# Patient Record
Sex: Female | Born: 1975 | Race: Black or African American | Hispanic: No | Marital: Married | Smoking: Never smoker
Health system: Southern US, Community
[De-identification: ages and names within clinical notes are randomized; demographics above are authoritative.]

## PROBLEM LIST (undated history)

## (undated) ENCOUNTER — Inpatient Hospital Stay (HOSPITAL_COMMUNITY): Payer: Self-pay

## (undated) DIAGNOSIS — R011 Cardiac murmur, unspecified: Secondary | ICD-10-CM

## (undated) DIAGNOSIS — O09299 Supervision of pregnancy with other poor reproductive or obstetric history, unspecified trimester: Secondary | ICD-10-CM

## (undated) DIAGNOSIS — T1491XA Suicide attempt, initial encounter: Secondary | ICD-10-CM

## (undated) DIAGNOSIS — O24419 Gestational diabetes mellitus in pregnancy, unspecified control: Secondary | ICD-10-CM

## (undated) DIAGNOSIS — N883 Incompetence of cervix uteri: Secondary | ICD-10-CM

## (undated) DIAGNOSIS — I493 Ventricular premature depolarization: Secondary | ICD-10-CM

## (undated) DIAGNOSIS — I10 Essential (primary) hypertension: Secondary | ICD-10-CM

## (undated) HISTORY — PX: LEEP: SHX91

## (undated) HISTORY — PX: CERVICAL CERCLAGE: SHX1329

## (undated) HISTORY — PX: TUBAL LIGATION: SHX77

---

## 1995-05-06 HISTORY — PX: DILATION AND CURETTAGE OF UTERUS: SHX78

## 2004-05-05 HISTORY — PX: WISDOM TOOTH EXTRACTION: SHX21

## 2009-07-24 ENCOUNTER — Emergency Department (HOSPITAL_COMMUNITY)
Admission: EM | Admit: 2009-07-24 | Discharge: 2009-07-24 | Payer: Self-pay | Source: Home / Self Care | Admitting: Emergency Medicine

## 2010-01-02 ENCOUNTER — Ambulatory Visit (HOSPITAL_COMMUNITY)
Admission: RE | Admit: 2010-01-02 | Discharge: 2010-01-02 | Payer: Self-pay | Source: Home / Self Care | Admitting: Obstetrics and Gynecology

## 2010-02-20 ENCOUNTER — Observation Stay (HOSPITAL_COMMUNITY)
Admission: AD | Admit: 2010-02-20 | Discharge: 2010-02-21 | Payer: Self-pay | Source: Home / Self Care | Admitting: Obstetrics and Gynecology

## 2010-02-21 ENCOUNTER — Encounter: Payer: Self-pay | Admitting: Obstetrics and Gynecology

## 2010-03-08 ENCOUNTER — Inpatient Hospital Stay (HOSPITAL_COMMUNITY)
Admission: AD | Admit: 2010-03-08 | Discharge: 2010-03-12 | Payer: Self-pay | Source: Home / Self Care | Admitting: Obstetrics and Gynecology

## 2010-03-11 ENCOUNTER — Encounter: Payer: Self-pay | Admitting: Obstetrics and Gynecology

## 2010-04-04 DEATH — deceased

## 2010-07-16 LAB — CBC
HCT: 33.6 % — ABNORMAL LOW (ref 36.0–46.0)
HCT: 36.9 % (ref 36.0–46.0)
Hemoglobin: 11.2 g/dL — ABNORMAL LOW (ref 12.0–15.0)
MCH: 28.8 pg (ref 26.0–34.0)
MCHC: 33.3 g/dL (ref 30.0–36.0)
MCV: 86.7 fL (ref 78.0–100.0)
RBC: 4.26 MIL/uL (ref 3.87–5.11)
WBC: 9.5 10*3/uL (ref 4.0–10.5)

## 2010-07-16 LAB — STREP B DNA PROBE: Strep Group B Ag: NEGATIVE

## 2010-07-19 LAB — CBC
MCH: 29.3 pg (ref 26.0–34.0)
MCV: 87.7 fL (ref 78.0–100.0)
Platelets: 246 10*3/uL (ref 150–400)
RDW: 13.8 % (ref 11.5–15.5)

## 2010-07-29 LAB — URINALYSIS, ROUTINE W REFLEX MICROSCOPIC
Bilirubin Urine: NEGATIVE
Ketones, ur: NEGATIVE mg/dL
Nitrite: NEGATIVE
Urobilinogen, UA: 0.2 mg/dL (ref 0.0–1.0)

## 2010-07-29 LAB — GC/CHLAMYDIA PROBE AMP, GENITAL
Chlamydia, DNA Probe: NEGATIVE
GC Probe Amp, Genital: NEGATIVE

## 2010-07-29 LAB — URINE MICROSCOPIC-ADD ON

## 2010-07-29 LAB — WET PREP, GENITAL
Clue Cells Wet Prep HPF POC: NONE SEEN
Trich, Wet Prep: NONE SEEN

## 2010-08-14 ENCOUNTER — Telehealth: Payer: Self-pay | Admitting: Cardiology

## 2010-12-04 NOTE — Telephone Encounter (Signed)
No note is required for this encoutner

## 2011-03-26 ENCOUNTER — Encounter: Payer: Self-pay | Admitting: Emergency Medicine

## 2011-03-26 ENCOUNTER — Emergency Department (HOSPITAL_COMMUNITY)
Admission: EM | Admit: 2011-03-26 | Discharge: 2011-03-26 | Disposition: A | Discharge status: Home / Self Care | Payer: Self-pay | Attending: Emergency Medicine | Admitting: Emergency Medicine

## 2011-03-26 DIAGNOSIS — R059 Cough, unspecified: Secondary | ICD-10-CM | POA: Insufficient documentation

## 2011-03-26 DIAGNOSIS — Z79899 Other long term (current) drug therapy: Secondary | ICD-10-CM | POA: Insufficient documentation

## 2011-03-26 DIAGNOSIS — R05 Cough: Secondary | ICD-10-CM | POA: Insufficient documentation

## 2011-03-26 DIAGNOSIS — J029 Acute pharyngitis, unspecified: Secondary | ICD-10-CM

## 2011-03-26 MED ORDER — HYDROCODONE-ACETAMINOPHEN 7.5-325 MG/15ML PO SOLN: 15.0000 mL | Freq: Three times a day (TID) | ORAL | Status: AC | PRN

## 2011-03-26 NOTE — ED Provider Notes (Signed)
History     CSN: [id number] Arrival date & time: 03/26/2011  4:05 AM   First MD Initiated Contact with Patient 03/26/11 0411      Chief Complaint  Patient presents with  . Sore Throat    sorethroat since yesterday    (Consider location/radiation/quality/duration/timing/severity/associated sxs/prior treatment) HPI Comments: Sx gradual onset Sx are constant Sx are moderate Sx are not associated with n/v or f/c.  Has cough which is non ptorductive  Has multiple sick contacts  Patient is a 35 y.o. female presenting with pharyngitis. The history is provided by the patient.  Sore Throat This is a new problem. The current episode started 2 days ago. The problem occurs constantly. The problem has been gradually worsening. Pertinent negatives include no chest pain, no abdominal pain, no headaches and no shortness of breath. The symptoms are aggravated by swallowing. The symptoms are relieved by nothing. She has tried nothing for the symptoms.    History reviewed. No pertinent past medical history.  History reviewed. No pertinent past surgical history.  History reviewed. No pertinent family history.  History  Substance Use Topics  . Smoking status: Never Smoker   . Smokeless tobacco: Not on file  . Alcohol Use: No    OB History    Grav Para Term Preterm Abortions TAB SAB Ect Mult Living                  Review of Systems  Respiratory: Negative for shortness of breath.   Cardiovascular: Negative for chest pain.  Gastrointestinal: Negative for abdominal pain.  Neurological: Negative for headaches.  All other systems reviewed and are negative.    Allergies  Review of patient's allergies indicates no known allergies.  Home Medications   Current Outpatient Rx  Name Route Sig Dispense Refill  . HYDROCHLOROTHIAZIDE 12.5 MG PO TABS Oral Take 12.5 mg by mouth daily.      Marland Kitchen HYDROCODONE-ACETAMINOPHEN 7.5-325 MG/15ML PO SOLN Oral Take 15 mLs by mouth every 8 (eight) hours as  needed for pain. 120 mL 0    BP 142/92  Pulse 83  Temp(Src) 98.2 F (36.8 C) (Oral)  Resp 17  SpO2 100%  LMP 02/16/2011  Physical Exam  Nursing note and vitals reviewed. Constitutional: She appears well-developed and well-nourished. No distress.  HENT:  Head: Normocephalic and atraumatic.  Mouth/Throat: No oropharyngeal exudate.       Erythema of the oropharynx without asymmetry, hypertrophy, or exudate.  Voice normal.  Bil TM's normal  Eyes: Conjunctivae and EOM are normal. Pupils are equal, round, and reactive to light. Right eye exhibits no discharge. Left eye exhibits no discharge. No scleral icterus.  Neck: Normal range of motion. Neck supple. No JVD present. No thyromegaly present.  Cardiovascular: Normal rate, regular rhythm, normal heart sounds and intact distal pulses.  Exam reveals no gallop and no friction rub.   No murmur heard. Pulmonary/Chest: Effort normal and breath sounds normal. No respiratory distress. She has no wheezes. She has no rales.  Abdominal: Soft. Bowel sounds are normal. She exhibits no distension and no mass. There is no tenderness.       No HSM  Musculoskeletal: Normal range of motion. She exhibits no edema and no tenderness.  Lymphadenopathy:    She has no cervical adenopathy.  Neurological: She is alert. Coordination normal.  Skin: Skin is warm and dry. No rash noted. No erythema.  Psychiatric: She has a normal mood and affect. Her behavior is normal.    ED Course  Procedures (including critical care time)   Labs Reviewed  RAPID STREP SCREEN   No results found.   1. Pharyngitis       MDM  Has normal VS without cervical adenopathy, sat's normal, voice normal, strep test sent.  Well appearing otherwise.  Strep test negative, will send him with a Chloraseptic Spray and hydrocodone suspension.  Vida Roller, MD 03/26/11 [phone]

## 2011-03-26 NOTE — ED Notes (Signed)
Patient c/o sorethroat since yesterday and wanted to check if she is pregnant because she missed her periods by a week

## 2011-04-30 LAB — OB RESULTS CONSOLE HEPATITIS B SURFACE ANTIGEN: Hepatitis B Surface Ag: NEGATIVE

## 2011-04-30 LAB — OB RESULTS CONSOLE ANTIBODY SCREEN: Antibody Screen: NEGATIVE

## 2011-04-30 LAB — OB RESULTS CONSOLE RUBELLA ANTIBODY, IGM: Rubella: IMMUNE

## 2011-05-06 HISTORY — PX: TUBAL LIGATION: SHX77

## 2011-05-14 ENCOUNTER — Other Ambulatory Visit: Payer: Self-pay

## 2011-05-14 ENCOUNTER — Encounter (HOSPITAL_COMMUNITY): Payer: Self-pay | Admitting: Pharmacist

## 2011-05-17 ENCOUNTER — Inpatient Hospital Stay (HOSPITAL_COMMUNITY): Payer: Medicaid Other

## 2011-05-17 ENCOUNTER — Encounter (HOSPITAL_COMMUNITY): Payer: Self-pay

## 2011-05-17 ENCOUNTER — Inpatient Hospital Stay (HOSPITAL_COMMUNITY)
Admission: AD | Admit: 2011-05-17 | Discharge: 2011-05-17 | Disposition: A | Discharge status: Home / Self Care | Payer: Medicaid Other | Source: Ambulatory Visit | Attending: Obstetrics and Gynecology | Admitting: Obstetrics and Gynecology

## 2011-05-17 DIAGNOSIS — O26899 Other specified pregnancy related conditions, unspecified trimester: Secondary | ICD-10-CM

## 2011-05-17 DIAGNOSIS — O3430 Maternal care for cervical incompetence, unspecified trimester: Secondary | ICD-10-CM | POA: Insufficient documentation

## 2011-05-17 DIAGNOSIS — O99891 Other specified diseases and conditions complicating pregnancy: Secondary | ICD-10-CM | POA: Insufficient documentation

## 2011-05-17 DIAGNOSIS — R109 Unspecified abdominal pain: Secondary | ICD-10-CM

## 2011-05-17 DIAGNOSIS — R1032 Left lower quadrant pain: Secondary | ICD-10-CM | POA: Insufficient documentation

## 2011-05-17 HISTORY — DX: Supervision of pregnancy with other poor reproductive or obstetric history, unspecified trimester: O09.299

## 2011-05-17 HISTORY — DX: Incompetence of cervix uteri: N88.3

## 2011-05-17 LAB — URINE CULTURE: Culture  Setup Time: 201301130331

## 2011-05-17 LAB — URINALYSIS, ROUTINE W REFLEX MICROSCOPIC
Glucose, UA: NEGATIVE mg/dL
Protein, ur: NEGATIVE mg/dL
pH: 6 (ref 5.0–8.0)

## 2011-05-17 LAB — URINE MICROSCOPIC-ADD ON

## 2011-05-17 MED ORDER — OXYCODONE-ACETAMINOPHEN 5-325 MG PO TABS
2.0000 | ORAL_TABLET | Freq: Once | ORAL | Status: AC
  Administered 2011-05-17: 2 via ORAL
  Filled 2011-05-17: qty 2

## 2011-05-17 MED ORDER — HYDROCODONE-ACETAMINOPHEN 5-500 MG PO TABS: 1.0000 | ORAL_TABLET | Freq: Four times a day (QID) | ORAL | Status: AC | PRN

## 2011-05-17 NOTE — Progress Notes (Signed)
Onset of left lower abdominal pain sharp in nature x 3 days worse today at work, patient denies dysuria, regular bowel movements, no vaginal bleeding or discharge. Patient is 13 week sgestation.

## 2011-05-17 NOTE — ED Provider Notes (Signed)
History   Pt presents today c/o worsening LLQ pain over the past 4 days. She is currently 12.6wks with a hx of incompetent cervix. She denies vag dc, bleeding, fever, or any other sx at this time.  Chief Complaint  Patient presents with  . Abdominal Pain   HPI  OB History    Grav Para Term Preterm Abortions TAB SAB Ect Mult Living   6 5 2 3  0 0 0 0 0 3      Past Medical History  Diagnosis Date  . Incompetence of cervix   . Prior pregnancy with fetal demise     Past Surgical History  Procedure Date  . Leep     Family History  Problem Relation Age of Onset  . Diabetes Mother   . Diabetes Father   . Heart disease Father   . Hypertension Father     History  Substance Use Topics  . Smoking status: Never Smoker   . Smokeless tobacco: Not on file  . Alcohol Use: No    Allergies: No Known Allergies  Prescriptions prior to admission  Medication Sig Dispense Refill  . Prenatal Vit-Fe Fumarate-FA (MULTIVITAMIN-PRENATAL) 27-0.8 MG TABS Take 1 tablet by mouth daily.        Review of Systems  Constitutional: Negative for fever.  Eyes: Negative for blurred vision and double vision.  Cardiovascular: Negative for chest pain.  Gastrointestinal: Positive for abdominal pain. Negative for nausea, vomiting, diarrhea and constipation.  Genitourinary: Negative for dysuria, urgency, frequency and hematuria.  Neurological: Negative for dizziness and headaches.  Psychiatric/Behavioral: Negative for depression and suicidal ideas.   Physical Exam   Blood pressure 113/75, temperature 98.9 F (37.2 C), resp. rate 16, height 5\' 6"  (1.676 m), weight 191 lb (86.637 kg), last menstrual period 02/16/2011.  Physical Exam  Nursing note and vitals reviewed. Constitutional: She is oriented to person, place, and time. She appears well-developed and well-nourished. No distress.  HENT:  Head: Normocephalic and atraumatic.  Eyes: EOM are normal. Pupils are equal, round, and reactive to light.    GI: Soft. She exhibits no distension and no mass. There is no tenderness. There is no rebound and no guarding.  Genitourinary: No bleeding around the vagina. No vaginal discharge found.       Cervix Lg/closed.  Neurological: She is alert and oriented to person, place, and time.  Skin: Skin is warm and dry. She is not diaphoretic.  Psychiatric: She has a normal mood and affect. Her behavior is normal. Judgment and thought content normal.    MAU Course  Procedures  Results for orders placed during the hospital encounter of 05/17/11 (from the past 72 hour(s))  URINALYSIS, ROUTINE W REFLEX MICROSCOPIC     Status: Abnormal   Collection Time   05/17/11  5:12 PM      Component Value Range Comment   Color, Urine YELLOW  YELLOW     APPearance CLEAR  CLEAR     Specific Gravity, Urine 1.015  1.005 - 1.030     pH 6.0  5.0 - 8.0     Glucose, UA NEGATIVE  NEGATIVE (mg/dL)    Hgb urine dipstick MODERATE (*) NEGATIVE     Bilirubin Urine NEGATIVE  NEGATIVE     Ketones, ur NEGATIVE  NEGATIVE (mg/dL)    Protein, ur NEGATIVE  NEGATIVE (mg/dL)    Urobilinogen, UA 0.2  0.0 - 1.0 (mg/dL)    Nitrite NEGATIVE  NEGATIVE     Leukocytes, UA NEGATIVE  NEGATIVE  URINE MICROSCOPIC-ADD ON     Status: Normal   Collection Time   05/17/11  5:12 PM      Component Value Range Comment   Squamous Epithelial / LPF RARE  RARE     RBC / HPF 3-6  <3 (RBC/hpf)    Urine sent for culture.  Discussed pt with Dr. Vincente Poli. Will get renal US.  US Renal  05/17/2011  *RADIOLOGY REPORT*  Clinical Data: Left flank pain.  Hematuria.  [redacted] weeks pregnant.  RENAL/URINARY TRACT ULTRASOUND COMPLETE  Comparison:  None.  Findings:  Right Kidney:  13.0 cm. No hydronephrosis.    The technologist describes an area of inter/lower pole echogenicity.  This is felt to represent a vessel.  Left Kidney:  12.5 cm. No hydronephrosis.  Bladder:  Within normal limits.  Bilateral ureteric jets identified.  IMPRESSION: Normal renal ultrasound.  No  explanation for left flank pain or hematuria.  Please note that ultrasound is of low sensitivity for renal calculi.  Original Report Authenticated By: Consuello Bossier, M.D.   Pt pain much improved following po pain meds.   Assessment and Plan  Pain in preg: discussed with pt at length. Will dc to home with lortab. She will f/u in Dr. Lynnell Dike office on Monday. Urine sent for culture. Discussed diet, activity, risks, and precautions.  Clinton Gallant. Jennifer Haas III, DrHSc, MPAS, PA-C  05/17/2011, 6:22 PM   Jennifer Hoover, PA 05/17/11 1936

## 2011-05-17 NOTE — Progress Notes (Signed)
Henrietta Hoover PA in to discuss u/s results with pt and d/c plan

## 2011-05-17 NOTE — Progress Notes (Signed)
Patient had preterm delivery at 22 weeks with previous pregnancy, scheduled for cerclage on 05/29/11

## 2011-05-17 NOTE — Progress Notes (Signed)
Pt presents to MAU with chief complaint of left sided abdominal pain. Pain started 4 days ago; sharp, intermittent, lower left side of abdomen. Pt is [redacted]w[redacted]d, hx of 22 week loss. No bleeding.

## 2011-05-17 NOTE — Progress Notes (Signed)
Written and verbal d/c instructions given and understanding voiced. 

## 2011-05-18 ENCOUNTER — Inpatient Hospital Stay (HOSPITAL_COMMUNITY)
Admission: AD | Admit: 2011-05-18 | Discharge: 2011-05-19 | DRG: 781 | Disposition: A | Discharge status: Home / Self Care | Payer: Medicaid Other | Source: Ambulatory Visit | Attending: Obstetrics and Gynecology | Admitting: Obstetrics and Gynecology

## 2011-05-18 ENCOUNTER — Encounter (HOSPITAL_COMMUNITY): Payer: Self-pay | Admitting: *Deleted

## 2011-05-18 ENCOUNTER — Inpatient Hospital Stay (HOSPITAL_COMMUNITY): Payer: Medicaid Other

## 2011-05-18 DIAGNOSIS — D259 Leiomyoma of uterus, unspecified: Secondary | ICD-10-CM | POA: Diagnosis present

## 2011-05-18 DIAGNOSIS — R1032 Left lower quadrant pain: Secondary | ICD-10-CM | POA: Diagnosis present

## 2011-05-18 DIAGNOSIS — O99891 Other specified diseases and conditions complicating pregnancy: Principal | ICD-10-CM | POA: Diagnosis present

## 2011-05-18 DIAGNOSIS — O3410 Maternal care for benign tumor of corpus uteri, unspecified trimester: Secondary | ICD-10-CM | POA: Diagnosis present

## 2011-05-18 DIAGNOSIS — R319 Hematuria, unspecified: Secondary | ICD-10-CM | POA: Diagnosis present

## 2011-05-18 LAB — URINALYSIS, ROUTINE W REFLEX MICROSCOPIC
Bilirubin Urine: NEGATIVE
Glucose, UA: NEGATIVE mg/dL
Ketones, ur: NEGATIVE mg/dL
pH: 6 (ref 5.0–8.0)

## 2011-05-18 LAB — CBC
HCT: 37 % (ref 36.0–46.0)
Hemoglobin: 12.6 g/dL (ref 12.0–15.0)
MCH: 28.9 pg (ref 26.0–34.0)
MCHC: 34.1 g/dL (ref 30.0–36.0)
MCV: 84.9 fL (ref 78.0–100.0)
RBC: 4.36 MIL/uL (ref 3.87–5.11)

## 2011-05-18 LAB — BASIC METABOLIC PANEL
Chloride: 104 mEq/L (ref 96–112)
Creatinine, Ser: 0.46 mg/dL — ABNORMAL LOW (ref 0.50–1.10)
GFR calc Af Amer: >90 mL/min (ref >90)
Potassium: 3.8 mEq/L (ref 3.5–5.1)
Sodium: 136 mEq/L (ref 135–145)

## 2011-05-18 LAB — URINE MICROSCOPIC-ADD ON

## 2011-05-18 LAB — ABO/RH: ABO/RH(D): O POS

## 2011-05-18 MED ORDER — PRENATAL MULTIVITAMIN CH
1.0000 | ORAL_TABLET | Freq: Every day | ORAL | Status: DC
  Administered 2011-05-19: 1 via ORAL
  Filled 2011-05-18 (×2): qty 1

## 2011-05-18 MED ORDER — ACETAMINOPHEN 325 MG PO TABS: 650.0000 mg | ORAL_TABLET | ORAL | Status: DC | PRN

## 2011-05-18 MED ORDER — OXYCODONE-ACETAMINOPHEN 5-325 MG PO TABS
2.0000 | ORAL_TABLET | ORAL | Status: DC | PRN
  Administered 2011-05-18: 1 via ORAL
  Administered 2011-05-19: 2 via ORAL
  Filled 2011-05-18: qty 1
  Filled 2011-05-18: qty 2

## 2011-05-18 MED ORDER — LACTATED RINGERS IV SOLN
INTRAVENOUS | Status: DC
  Administered 2011-05-18 – 2011-05-19 (×4): via INTRAVENOUS

## 2011-05-18 MED ORDER — CALCIUM CARBONATE ANTACID 500 MG PO CHEW: 2.0000 | CHEWABLE_TABLET | ORAL | Status: DC | PRN

## 2011-05-18 MED ORDER — OXYCODONE-ACETAMINOPHEN 5-325 MG PO TABS
1.0000 | ORAL_TABLET | ORAL | Status: DC | PRN
  Administered 2011-05-18 (×3): 1 via ORAL
  Filled 2011-05-18 (×3): qty 1

## 2011-05-18 MED ORDER — DOCUSATE SODIUM 100 MG PO CAPS
100.0000 mg | ORAL_CAPSULE | Freq: Every day | ORAL | Status: DC
Start: 2011-05-18 — End: 2011-05-19
  Administered 2011-05-19: 100 mg via ORAL
  Filled 2011-05-18 (×2): qty 1

## 2011-05-18 MED ORDER — ZOLPIDEM TARTRATE 10 MG PO TABS
10.0000 mg | ORAL_TABLET | Freq: Every evening | ORAL | Status: DC | PRN
  Administered 2011-05-18: 10 mg via ORAL
  Filled 2011-05-18: qty 1

## 2011-05-18 MED ORDER — KETOROLAC TROMETHAMINE 30 MG/ML IJ SOLN
30.0000 mg | Freq: Once | INTRAMUSCULAR | Status: AC
  Administered 2011-05-18: 30 mg via INTRAVENOUS
  Filled 2011-05-18: qty 1

## 2011-05-18 NOTE — Progress Notes (Signed)
Patient has continued to have the intermittent LLQ pain all day. Has not worsened. Has tolerated regular diet. Percocet helps some with pain. Denies any contraction like pain. Recommend Repeat UA in am to check for hematuria. If pain persists, Dr Berneice Heinrich recommends CT scan. I discussed this with the patient. We will reevaluate in am and if clinically unimproved Will proceed with CT scan.

## 2011-05-18 NOTE — Treatment Plan (Signed)
Report to Selena Batten, Forensic scientist.  Report given. Patient may go to Room 320 from U/S

## 2011-05-18 NOTE — Progress Notes (Signed)
Pt reports sharp pain in LLQ x 1 week. Was seen in MAU yesterday. R/O UTI and Kidney stone. Pt stated she is still in pain but does not want to keep on taking pain medication. Wants to know what is cause ing the pain.

## 2011-05-18 NOTE — Progress Notes (Signed)
Telephone call to Dr Vincente Poli to notify of unit change.  No additional orders recieved

## 2011-05-18 NOTE — H&P (Signed)
36 year old Gravida 6 Para 2303 at 13 weeks presents to MAU for the second time in 2 days. She complains of LLQ pain started last week but worsened yesterday. It is intermittent, colicky and not associated with fever , nausea or vomiting. No vaginal bleeding. No history of kidney stones. In MAU yesterday, Renal ultrasound was negative but UA showed moderated hemoglobin. She was sent home with Percocet and came back this am because pain returned. She has a very poor obstetrical history - see hollister. She has history of PPROM and cervical incompetence and delivered last baby at 22 weeks and baby expired. She is scheduled for an abdominal cerclage on January 24th.  Afebrile  Vital signs stable Fetal heart tones General alert and oriented, Lung ctab Car rrr Abdomen is soft Tender in LLQ localized No rebound or guarding Cervix no blood Short but closed  IMPRESSION: LLQ PAIN HEMATURIA Poor obstetrical history  PLAN: ADMIT TO ANTENATAL I HAVE Requested Urology consult - spoke to Dr. Berneice Heinrich and he is coming to evaluate the patient today. IV Fluids Cervical length.

## 2011-05-18 NOTE — Consult Note (Signed)
Reason for Consult: Abdominal Pain, Hemoglobinuria Referring Physician: Petrona Wyeth is an 36 y.o. female.  HPI: G61P3 female in 1st trimester of pregnancy with 1 week of left sided abdominal pain and UA with moderate hemoglobinuria and normal renal ultrasound. Urine microscopy without significant RBC's. Pain constant, 4/10,  but with periods of "lightning bolt" increases in discomfort x 1 day. Denies aggravating / alleviating factors. No gross hematuria, dysuria, frequency, h/o recurrent UTI, h/o nephrolithiasis,h/o Urologic surgery, or family h/o urologic or renal disease.  PMH significant for obesity, HTN, and cervical incompetence with several prior cerclages performed. NO add'l surgeries.  Past Medical History  Diagnosis Date  . Incompetence of cervix   . Prior pregnancy with fetal demise     Past Surgical History  Procedure Date  . Leep     Family History  Problem Relation Age of Onset  . Diabetes Mother   . Diabetes Father   . Heart disease Father   . Hypertension Father     Social History:  reports that she has never smoked. She does not have any smokeless tobacco history on file. She reports that she does not drink alcohol or use illicit drugs.  Allergies: No Known Allergies  Medications: I have reviewed the patient's current medications.  Results for orders placed during the hospital encounter of 05/18/11 (from the past 48 hour(s))  CBC     Status: Normal   Collection Time   05/18/11  8:04 AM      Component Value Range Comment   WBC 7.8  4.0 - 10.5 (K/uL)    RBC 4.36  3.87 - 5.11 (MIL/uL)    Hemoglobin 12.6  12.0 - 15.0 (g/dL)    HCT 78.2  95.6 - 21.3 (%)    MCV 84.9  78.0 - 100.0 (fL)    MCH 28.9  26.0 - 34.0 (pg)    MCHC 34.1  30.0 - 36.0 (g/dL)    RDW 08.6  57.8 - 46.9 (%)    Platelets 246  150 - 400 (K/uL)   ABO/RH     Status: Normal   Collection Time   05/18/11  8:04 AM      Component Value Range Comment   ABO/RH(D) O POS     URINALYSIS,  ROUTINE W REFLEX MICROSCOPIC     Status: Abnormal   Collection Time   05/18/11  8:10 AM      Component Value Range Comment   Color, Urine YELLOW  YELLOW     APPearance CLEAR  CLEAR     Specific Gravity, Urine 1.025  1.005 - 1.030     pH 6.0  5.0 - 8.0     Glucose, UA NEGATIVE  NEGATIVE (mg/dL)    Hgb urine dipstick MODERATE (*) NEGATIVE     Bilirubin Urine NEGATIVE  NEGATIVE     Ketones, ur NEGATIVE  NEGATIVE (mg/dL)    Protein, ur NEGATIVE  NEGATIVE (mg/dL)    Urobilinogen, UA 0.2  0.0 - 1.0 (mg/dL)    Nitrite NEGATIVE  NEGATIVE     Leukocytes, UA NEGATIVE  NEGATIVE    URINE MICROSCOPIC-ADD ON     Status: Abnormal   Collection Time   05/18/11  8:10 AM      Component Value Range Comment   Squamous Epithelial / LPF MANY (*) RARE     WBC, UA 0-2  <3 (WBC/hpf)    RBC / HPF 0-2  <3 (RBC/hpf)    Bacteria, UA FEW (*) RARE  Urine-Other MUCOUS PRESENT       US Renal  05/17/2011  *RADIOLOGY REPORT*  Clinical Data: Left flank pain.  Hematuria.  [redacted] weeks pregnant.  RENAL/URINARY TRACT ULTRASOUND COMPLETE  Comparison:  None.  Findings:  Right Kidney:  13.0 cm. No hydronephrosis.    The technologist describes an area of inter/lower pole echogenicity.  This is felt to represent a vessel.  Left Kidney:  12.5 cm. No hydronephrosis.  Bladder:  Within normal limits.  Bilateral ureteric jets identified.  IMPRESSION: Normal renal ultrasound.  No explanation for left flank pain or hematuria.  Please note that ultrasound is of low sensitivity for renal calculi.  Original Report Authenticated By: Consuello Bossier, M.D.    Review of Systems  Constitutional: Negative.  Negative for fever, chills, weight loss and malaise/fatigue.  HENT: Negative.   Eyes: Negative.   Respiratory: Negative.   Cardiovascular: Negative.   Gastrointestinal: Positive for abdominal pain. Negative for heartburn, nausea and vomiting.  Genitourinary: Negative.  Negative for dysuria, urgency, frequency, hematuria and flank pain.    Musculoskeletal: Negative.  Negative for myalgias.  Skin: Negative.   Neurological: Negative.   Endo/Heme/Allergies: Negative.   Psychiatric/Behavioral: Negative.    Blood pressure 124/67, pulse 79, temperature 98.4 F (36.9 C), temperature source Oral, resp. rate 18, height 5\' 6"  (1.676 m), weight 86.365 kg (190 lb 6.4 oz), last menstrual period 02/16/2011. Physical Exam  Constitutional: She is oriented to person, place, and time. She appears well-developed and well-nourished.  HENT:  Head: Normocephalic and atraumatic.  Eyes: EOM are normal. Pupils are equal, round, and reactive to light.  Neck: Normal range of motion. Neck supple.  Cardiovascular: Normal rate and regular rhythm.   Respiratory: Effort normal and breath sounds normal.  GI: Soft. Bowel sounds are normal. She exhibits no distension. There is tenderness. There is guarding. There is no rebound.       Gravid uterus. Significant LLQ TTP with guarding. No flank pain.  Genitourinary: Vagina normal.       NO gross vaginal blood  Musculoskeletal: Normal range of motion. She exhibits no edema.       No myalgias  Neurological: She is alert and oriented to person, place, and time.  Skin: Skin is warm and dry.  Psychiatric: She has a normal mood and affect. Her behavior is normal. Judgment and thought content normal.    Assessment/Plan: 1 - Left Flank Pain - No obvious Urologic cause of pain. No hydro, stones seen on ultrasound, excellent bilateral ureteral jets visualized which rules out sig obstruction. Exam more c/w GI/GYN source.  Small ureteral stone is still possible as [place] not the most sensitive test, but even if small stone, would elect hydration and trial of passage anyway.  If no other source found, a trial of Flomax (Preg Cat B) may help if pain truly ureteral as will help decrease ureteral spasms.   I also mentioned to patient that although CT scans are not first line diagnostics during pregnancy, I would recommend CT  abd/pelvis if pain still present and this severe if no improvement in next several days with conservative measures.  2 - Hemoglobinuria - Again, no sig RBC's seen on exam. DDX includes UTI, medical renal disease.  Stat BMP obtained . Agree with urine CX and even emperic ABX in interim until urine sterility assured.  3- Please call with any questions.   Barrie Wale 05/18/2011, 9:24 AM

## 2011-05-18 NOTE — Progress Notes (Signed)
Dr. Vincente Poli notified pt has had no pain relief from her Percocet 1 tab @1030 . Rates pain 7 out of 10 sharp, constant LLQ. No vaginal bleeding. OB u/s report read. Orders given for Toradol 30mg  IV x1. Will continue to monitor.

## 2011-05-19 ENCOUNTER — Encounter (HOSPITAL_COMMUNITY): Payer: Medicaid Other

## 2011-05-19 ENCOUNTER — Inpatient Hospital Stay (HOSPITAL_COMMUNITY): Admission: RE | Admit: 2011-05-19 | Payer: Self-pay | Source: Ambulatory Visit

## 2011-05-19 LAB — URINALYSIS, ROUTINE W REFLEX MICROSCOPIC
Ketones, ur: NEGATIVE mg/dL
Leukocytes, UA: NEGATIVE
Nitrite: NEGATIVE
pH: 7 (ref 5.0–8.0)

## 2011-05-19 LAB — URINE MICROSCOPIC-ADD ON

## 2011-05-19 NOTE — Patient Instructions (Addendum)
20 Jennifer Haas  05/19/2011   Your procedure is scheduled on:  05/29/11    Register with admitting in Maternity Admissions no later than 6:00 AM  Call this number if you have problems the morning of surgery: [phone]   Remember:   Do not eat food:After Midnight.  Do not drink clear liquids: After Midnight.  Take these medicines the morning of surgery with A SIP OF WATER: NA   Do not wear jewelry, make-up or nail polish.  Do not wear lotions, powders, or perfumes. You may wear deodorant.  Do not shave 48 hours prior to surgery.  Do not bring valuables to the hospital.  Contacts, dentures or bridgework may not be worn into surgery.  Leave suitcase in the car. After surgery it may be brought to your room.  For patients admitted to the hospital, checkout time is 11:00 AM the day of discharge.   Patients discharged the day of surgery will not be allowed to drive home.  Name and phone number of your driver: NA  Special Instructions: CHG Shower Use Special Wash: 1/2 bottle night before surgery and 1/2 bottle morning of surgery.   Please read over the following fact sheets that you were given: MRSA Information

## 2011-05-19 NOTE — Progress Notes (Signed)
Patient ID: Jennifer Haas, female   DOB: Jun 16, 1975, 36 y.o.   MRN: [id number] 36 yo AAF at [redacted] weeks gestation continues to experience intermittant LLQ pain.Denies VB, leakage of AF.Denies dysuria. Denies contactions Afebrile,T-98.4 Abd soft. - CVAT Renal ultrasound revealed no evidence of hydronephrosis , bilateral jets seen Awaiting repeat urinalysis for hematuria this am. Probable CT scan today per Dr. Emmaline Life recommendations

## 2011-05-19 NOTE — Progress Notes (Signed)
Pt has not required any further pain meds all day.  No n/v/d.  No f/c.  Pt comfortable with discharge home  AF, VSS Gen - NAD Abd - mild LLQ tenderness,  No r/g. Ext - NT.  No edema Cvx - deferred  UA (repeat) - trace Hgb, o/w neg  A/P:  LLQ pain, suspect pain could be related to fibroid growth.   Pain improved.  Will d/c home.  Pt to keep f/u appt in office 1/15

## 2011-05-19 NOTE — Discharge Summary (Signed)
Physician Discharge Summary  Patient ID: TERI LEGACY MRN: [id number] DOB/AGE: 1975-05-19 36 y.o.  Admit date: 05/18/2011 Discharge date: 05/19/2011  Admission Diagnoses:  Discharge Diagnoses:  Active Problems:  Abdominal pain, LLQ   Discharged Condition: good  Hospital Course: Pt was admitted /w LLQ pain and hematuria.  Pain was controlled with PO meds.  Renal US was wnl.  Pelvic US shows Left fibroid and normal cervical length.  On the day of discharge, pain was significantly improved and she did not require any pain meds for >12 hrs.  Pt was comfortable with discharge.    Consults: urology  Significant Diagnostic Studies: labs: CBC, UA*  Treatments: IV hydration and analgesia: percocet  Discharge Exam: Blood pressure 108/65, pulse 75, temperature 98.2 F (36.8 C), temperature source Oral, resp. rate 18, height 5\' 6"  (1.676 m), weight 85.775 kg (189 lb 1.6 oz), last menstrual period 02/16/2011, SpO2 99.00%. See progress note  Disposition: Home or Self Care   Medication List  As of 05/19/2011  6:22 PM   TAKE these medications         HYDROcodone-acetaminophen 5-500 MG per tablet   Commonly known as: VICODIN   Take 1 tablet by mouth every 6 (six) hours as needed for pain.      prenatal multivitamin Tabs   Take 1 tablet by mouth daily.           Follow-up Information    Follow up with [facility] in 1 day.   Contact information:   [facility] [address], [address] [place] [address] [phone]          Signed: Zelphia Cairo 05/19/2011, 6:22 PM

## 2011-05-19 NOTE — Pre-Procedure Instructions (Signed)
Pre-op instructions given while in-patient. Obtained surgical screen swabs. Pre-op interview completed.  Patton Salles, RN

## 2011-05-19 NOTE — Progress Notes (Signed)
Patient ID: Jennifer Haas, female   DOB: 1976-02-10, 36 y.o.   MRN: [id number] Please see free text note. Please disregard this note

## 2011-05-19 NOTE — Plan of Care (Signed)
Problem: Phase I Progression Outcomes Goal: Pain controlled with appropriate interventions Outcome: Completed/Met Date Met:  05/19/11 Pain med increased to 2 Percocet's and pain is much improved Goal: OOB as tolerated unless otherwise ordered Outcome: Completed/Met Date Met:  05/19/11 Patient tolerates up to bathroom without any problems Goal: Initial discharge plan identified Outcome: Completed/Met Date Met:  05/19/11 Pain controlled  Continue OB care R/O kidney stones Goal: Voiding-avoid urinary catheter unless indicated Outcome: Completed/Met Date Met:  05/19/11 Voids large amts of clear yellow that has strained negative for stones Goal: Hemodynamically stable Outcome: Completed/Met Date Met:  05/19/11 Vitals stable at this point

## 2011-05-19 NOTE — Progress Notes (Addendum)
Pt reports LLQ pain persists.  No n/v/d.  No constipation.  No dysuria.  Pt awoke this am with continued pain, improved with percocet x 2.    Renal US negative for stone Pelvic US w/ Cvx length 3.2cm, and 5.9cm Left sided fibroid  AF, VSS Gen - NAD Abd - soft, ND.  Tender LLQ.  No R/G. Ext - NT PV - deferred  A/P:  LLQ pain w/ hematuria 1. Await repeat UA this am. if hematuria still present, may treat conservatively as stone.  US shows no hydronephrosis and good ureteral jets bilaterally.   2. Pain could also be from left sided fibroid although this doesn't explain hematuria.  Fibroid slightly larger than last office [place] 3. Pt scheduled for abdominal cerclage 1/24.

## 2011-05-27 NOTE — H&P (Signed)
NAME:  Jennifer Haas, TERRILYN                   ACCOUNT NO.:  MEDICAL RECORD NO.:  LOCATION:                                 FACILITY:  PHYSICIAN:  Juluis Mire, M.D.        DATE OF BIRTH:  DATE OF ADMISSION: DATE OF DISCHARGE:                             HISTORY & PHYSICAL   HISTORY OF PRESENT ILLNESS:  The patient is a 36 year old gravida 7, para 1-4-1-3 female who presents for abdominal cerclage.  The patient's last menstrual period was February 16, 2011, her estimated date of confinement is November 23, 2011, has an estimated gestational age of about 14 weeks and 5 days.  The patient's prior pregnancies have been extremely complicated.  For the last 3 pregnancies, she has had cervical cerclages placed.  In 2006, she had premature rupture of membranes at 35 weeks, in 2007, premature rupture of membranes at 22 weeks, and then in 2011, she had premature cervical changes in delivery at 22 weeks despite the cervical cerclage. In view of the lack of response to cervical cerclage in bed rest, we are going to find abdominal cerclage for management.  One complicating factor she does have a lower segment fibroid on the left side.  It feels like it is probably out of the way so that we can get the cervical in place but that is obviously a concern.  She is at risk for advanced maternal age, did have first trimester ultrasound screening that was negative for any increased risk of genetic anomaly, she declined amniocentesis.  ALLERGIES:  No known drug allergies.  MEDICATIONS:  Prenatal vitamins.  PAST MEDICAL HISTORY:  Please see prenatal records.  FAMILY HISTORY:  Please see prenatal records.  SOCIAL HISTORY:  Please see prenatal records.  REVIEW OF SYSTEMS:  Noncontributory.  PHYSICAL EXAMINATION:  GENERAL:  The patient is afebrile with stable vital signs. HEENT:  The patient is normocephalic.  Pupils equal and reactive to light and accommodation.  Extraocular movements were intact.   Sclerae and conjunctivae are clear.  Oropharynx clear. NECK:  No thyromegaly. BREASTS:  Not examined. LUNGS:  Clear. CARDIOVASCULAR:  Regular rhythm and rate.  There are no murmurs or gallops.  ABDOMEN:  A gravid uterus consistent with 15 weeks fetal heart tones __________. PELVIC:  Cervix appears long and fingertip at the present time.  On the left side you can feel a uterine fibroid.  Still I think we have enough cervical length to do the cerclage. EXTREMITIES:  Trace edema. NEUROLOGIC:  Grossly within normal limits.  IMPRESSION: 1. 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  Plan:  Abdominal cerclage. 2. Intrauterine pregnancy at 15 weeks.  PLAN:  The patient will undergo abdominal cerclage.  The nature of procedure and risk have been discussed.  The risks would include risk of infection.  There are possible risks of hemorrhage that could require transfusion with the risk of AIDS, or hepatitis.  Excessive bleeding could require hysterectomy with loss of pregnancy.  There is a risk of injury to adjacent organs, this could include bowel, bladder or ureters, this could require further exploratory surgery, risk  of deep venous thrombosis and pulmonary embolus.  There is also a potential risk for rupture of membranes leading to preterm and previable delivery of the infant.  Possible stimulation of miscarriage could also be undertaken. She does understand with abdominal cerclage, there will be need for cesarean section for delivery of the infant.  In view of the presence of uterine fibroids,  there is a potential that after we get in, we may not be able to place it, if that is true, we will try to do a cervical cerclage and see how she responds to that.  The patient voiced an understanding of the potential risks, complications, and alternatives.     Juluis Mire, M.D.     JSM/MEDQ  D:  05/27/2011  T:  05/27/2011  Job:  [id number]

## 2011-05-27 NOTE — H&P (Signed)
  Patient name  Jennifer Haas, Macaluso DICTATION# [id number] CSN# [id number]  [facility], MD 05/27/2011 5:52 AM

## 2011-05-29 ENCOUNTER — Encounter (HOSPITAL_COMMUNITY)
Admission: RE | Disposition: A | Discharge status: Home / Self Care | Payer: Self-pay | Source: Ambulatory Visit | Attending: Obstetrics and Gynecology

## 2011-05-29 ENCOUNTER — Inpatient Hospital Stay (HOSPITAL_COMMUNITY)
Admission: RE | Admit: 2011-05-29 | Discharge: 2011-05-31 | DRG: 782 | Disposition: A | Discharge status: Home / Self Care | Payer: Medicaid Other | Source: Ambulatory Visit | Attending: Obstetrics and Gynecology | Admitting: Obstetrics and Gynecology

## 2011-05-29 ENCOUNTER — Encounter (HOSPITAL_COMMUNITY): Payer: Self-pay | Admitting: Anesthesiology

## 2011-05-29 ENCOUNTER — Inpatient Hospital Stay (HOSPITAL_COMMUNITY): Payer: Medicaid Other | Admitting: Anesthesiology

## 2011-05-29 ENCOUNTER — Encounter (HOSPITAL_COMMUNITY): Payer: Self-pay | Admitting: *Deleted

## 2011-05-29 DIAGNOSIS — O3430 Maternal care for cervical incompetence, unspecified trimester: Principal | ICD-10-CM | POA: Diagnosis present

## 2011-05-29 DIAGNOSIS — O3410 Maternal care for benign tumor of corpus uteri, unspecified trimester: Secondary | ICD-10-CM | POA: Diagnosis present

## 2011-05-29 DIAGNOSIS — D259 Leiomyoma of uterus, unspecified: Secondary | ICD-10-CM | POA: Diagnosis present

## 2011-05-29 HISTORY — PX: ABDOMINAL CERCLAGE: SHX5384

## 2011-05-29 SURGERY — CERCLAGE, CERVIX, ABDOMINAL APPROACH
Anesthesia: Spinal | Site: Abdomen | Wound class: Clean Contaminated

## 2011-05-29 MED ORDER — PROMETHAZINE HCL 25 MG/ML IJ SOLN: 6.2500 mg | INTRAMUSCULAR | Status: DC | PRN

## 2011-05-29 MED ORDER — DIPHENHYDRAMINE HCL 50 MG/ML IJ SOLN: 25.0000 mg | INTRAMUSCULAR | Status: DC | PRN

## 2011-05-29 MED ORDER — SODIUM CHLORIDE 0.9 % IJ SOLN: 3.0000 mL | INTRAMUSCULAR | Status: DC | PRN

## 2011-05-29 MED ORDER — FENTANYL CITRATE 0.05 MG/ML IJ SOLN
INTRAMUSCULAR | Status: AC
  Filled 2011-05-29: qty 2

## 2011-05-29 MED ORDER — MORPHINE SULFATE (PF) 0.5 MG/ML IJ SOLN
INTRAMUSCULAR | Status: DC | PRN
Start: 2011-05-29 — End: 2011-05-29
  Administered 2011-05-29: .1 mg via INTRATHECAL

## 2011-05-29 MED ORDER — MEPERIDINE HCL 25 MG/ML IJ SOLN: 6.2500 mg | INTRAMUSCULAR | Status: DC | PRN

## 2011-05-29 MED ORDER — PRENATAL MULTIVITAMIN CH: 1.0000 | ORAL_TABLET | Freq: Every day | ORAL | Status: DC

## 2011-05-29 MED ORDER — ONDANSETRON HCL 4 MG PO TABS: 4.0000 mg | ORAL_TABLET | Freq: Four times a day (QID) | ORAL | Status: DC | PRN

## 2011-05-29 MED ORDER — BUPIVACAINE IN DEXTROSE 0.75-8.25 % IT SOLN
INTRATHECAL | Status: DC | PRN
  Administered 2011-05-29: 1.5 mL via INTRATHECAL

## 2011-05-29 MED ORDER — KETOROLAC TROMETHAMINE 30 MG/ML IJ SOLN
INTRAMUSCULAR | Status: AC
  Administered 2011-05-29: 30 mg via INTRAVENOUS
  Filled 2011-05-29: qty 1

## 2011-05-29 MED ORDER — FENTANYL CITRATE 0.05 MG/ML IJ SOLN
INTRAMUSCULAR | Status: DC | PRN
  Administered 2011-05-29: 100 ug via INTRAVENOUS
  Administered 2011-05-29: 15 ug via INTRATHECAL
  Administered 2011-05-29: 50 ug via INTRAVENOUS
  Administered 2011-05-29: 85 ug via INTRAVENOUS
  Administered 2011-05-29: 50 ug via INTRAVENOUS

## 2011-05-29 MED ORDER — DIPHENHYDRAMINE HCL 50 MG/ML IJ SOLN: 12.5000 mg | INTRAMUSCULAR | Status: DC | PRN

## 2011-05-29 MED ORDER — CEFAZOLIN SODIUM 1-5 GM-% IV SOLN
1.0000 g | INTRAVENOUS | Status: AC
  Administered 2011-05-29: 1000 mg via INTRAVENOUS

## 2011-05-29 MED ORDER — NALOXONE HCL 0.4 MG/ML IJ SOLN: 0.4000 mg | INTRAMUSCULAR | Status: DC | PRN

## 2011-05-29 MED ORDER — ONDANSETRON HCL 4 MG/2ML IJ SOLN
INTRAMUSCULAR | Status: AC
  Filled 2011-05-29: qty 2

## 2011-05-29 MED ORDER — NALBUPHINE SYRINGE 5 MG/0.5 ML
5.0000 mg | INJECTION | INTRAMUSCULAR | Status: DC | PRN
  Filled 2011-05-29: qty 1

## 2011-05-29 MED ORDER — IBUPROFEN 600 MG PO TABS
600.0000 mg | ORAL_TABLET | Freq: Four times a day (QID) | ORAL | Status: DC | PRN
  Administered 2011-05-30 – 2011-05-31 (×2): 600 mg via ORAL
  Filled 2011-05-29 (×2): qty 1

## 2011-05-29 MED ORDER — MORPHINE SULFATE 0.5 MG/ML IJ SOLN
INTRAMUSCULAR | Status: AC
  Filled 2011-05-29: qty 10

## 2011-05-29 MED ORDER — DIPHENHYDRAMINE HCL 25 MG PO CAPS
25.0000 mg | ORAL_CAPSULE | ORAL | Status: DC | PRN
  Administered 2011-05-29: 25 mg via ORAL
  Filled 2011-05-29: qty 1

## 2011-05-29 MED ORDER — KETOROLAC TROMETHAMINE 30 MG/ML IJ SOLN
30.0000 mg | Freq: Four times a day (QID) | INTRAMUSCULAR | Status: AC | PRN
  Administered 2011-05-29: 30 mg via INTRAVENOUS
  Filled 2011-05-29: qty 1

## 2011-05-29 MED ORDER — FENTANYL CITRATE 0.05 MG/ML IJ SOLN
25.0000 ug | INTRAMUSCULAR | Status: DC | PRN
  Administered 2011-05-29: 50 ug via INTRAVENOUS

## 2011-05-29 MED ORDER — ONDANSETRON HCL 4 MG/2ML IJ SOLN
INTRAMUSCULAR | Status: DC | PRN
  Administered 2011-05-29: 4 mg via INTRAVENOUS

## 2011-05-29 MED ORDER — FENTANYL CITRATE 0.05 MG/ML IJ SOLN
50.0000 ug | Freq: Once | INTRAMUSCULAR | Status: AC
  Administered 2011-05-29: 50 ug via INTRAVENOUS

## 2011-05-29 MED ORDER — LACTATED RINGERS IV SOLN
INTRAVENOUS | Status: DC
  Administered 2011-05-29: 50 mL/h via INTRAVENOUS
  Administered 2011-05-29 (×3): via INTRAVENOUS

## 2011-05-29 MED ORDER — ONDANSETRON HCL 4 MG/2ML IJ SOLN: 4.0000 mg | Freq: Three times a day (TID) | INTRAMUSCULAR | Status: DC | PRN

## 2011-05-29 MED ORDER — PRENATAL MULTIVITAMIN CH
1.0000 | ORAL_TABLET | Freq: Every day | ORAL | Status: DC
  Administered 2011-05-29 – 2011-05-31 (×3): 1 via ORAL
  Filled 2011-05-29 (×3): qty 1

## 2011-05-29 MED ORDER — SCOPOLAMINE 1 MG/3DAYS TD PT72
MEDICATED_PATCH | TRANSDERMAL | Status: AC
  Administered 2011-05-29: 1.5 mg via TRANSDERMAL
  Filled 2011-05-29: qty 1

## 2011-05-29 MED ORDER — KETOROLAC TROMETHAMINE 30 MG/ML IJ SOLN
15.0000 mg | Freq: Once | INTRAMUSCULAR | Status: AC | PRN
  Administered 2011-05-29: 30 mg via INTRAVENOUS

## 2011-05-29 MED ORDER — SODIUM CHLORIDE 0.9 % IV SOLN: 1.0000 ug/kg/h | INTRAVENOUS | Status: DC | PRN

## 2011-05-29 MED ORDER — ONDANSETRON HCL 4 MG/2ML IJ SOLN: 4.0000 mg | Freq: Four times a day (QID) | INTRAMUSCULAR | Status: DC | PRN

## 2011-05-29 MED ORDER — KETOROLAC TROMETHAMINE 30 MG/ML IJ SOLN: 30.0000 mg | Freq: Four times a day (QID) | INTRAMUSCULAR | Status: AC | PRN

## 2011-05-29 MED ORDER — METOCLOPRAMIDE HCL 5 MG/ML IJ SOLN: 10.0000 mg | Freq: Three times a day (TID) | INTRAMUSCULAR | Status: DC | PRN

## 2011-05-29 MED ORDER — LACTATED RINGERS IV SOLN
INTRAVENOUS | Status: DC
  Administered 2011-05-29 – 2011-05-30 (×3): via INTRAVENOUS

## 2011-05-29 MED ORDER — OXYCODONE-ACETAMINOPHEN 5-325 MG PO TABS
1.0000 | ORAL_TABLET | ORAL | Status: DC | PRN
  Administered 2011-05-29 – 2011-05-30 (×7): 2 via ORAL
  Administered 2011-05-31: 1 via ORAL
  Administered 2011-05-31: 2 via ORAL
  Filled 2011-05-29 (×2): qty 2
  Filled 2011-05-29: qty 1
  Filled 2011-05-29 (×7): qty 2

## 2011-05-29 MED ORDER — SCOPOLAMINE 1 MG/3DAYS TD PT72
1.0000 | MEDICATED_PATCH | Freq: Once | TRANSDERMAL | Status: DC
  Administered 2011-05-29: 1.5 mg via TRANSDERMAL

## 2011-05-29 MED ORDER — KETOROLAC TROMETHAMINE 60 MG/2ML IM SOLN
60.0000 mg | Freq: Once | INTRAMUSCULAR | Status: DC | PRN
  Filled 2011-05-29: qty 2

## 2011-05-29 MED ORDER — FENTANYL CITRATE 0.05 MG/ML IJ SOLN
INTRAMUSCULAR | Status: AC
  Administered 2011-05-29: 50 ug via INTRAVENOUS
  Filled 2011-05-29: qty 2

## 2011-05-29 SURGICAL SUPPLY — 20 items
CLOTH BEACON ORANGE TIMEOUT ST (SAFETY) ×2 IMPLANT
DERMABOND ADVANCED (GAUZE/BANDAGES/DRESSINGS) ×1
DERMABOND ADVANCED .7 DNX12 (GAUZE/BANDAGES/DRESSINGS) ×1 IMPLANT
GAUZE SPONGE 4X4 16PLY XRAY LF (GAUZE/BANDAGES/DRESSINGS) ×2 IMPLANT
GLOVE BIO SURGEON STRL SZ7 (GLOVE) ×4 IMPLANT
GOWN PREVENTION PLUS LG XLONG (DISPOSABLE) ×4 IMPLANT
NS IRRIG 1000ML POUR BTL (IV SOLUTION) ×2 IMPLANT
PACK ABDOMINAL GYN (CUSTOM PROCEDURE TRAY) ×2 IMPLANT
PAD OB MATERNITY 4.3X12.25 (PERSONAL CARE ITEMS) ×2 IMPLANT
STAPLER VISISTAT 35W (STAPLE) ×2 IMPLANT
SUT CHROMIC 0 CT 1 (SUTURE) ×2 IMPLANT
SUT CHROMIC 2 0 SH (SUTURE) ×2 IMPLANT
SUT MERSILENE 5MM BP 1 12 (SUTURE) ×2 IMPLANT
SUT MNCRL AB 3-0 PS2 27 (SUTURE) ×2 IMPLANT
SUT PDS AB 0 CT 36 (SUTURE) ×2 IMPLANT
SUT PDS AB 0 CTX 60 (SUTURE) ×2 IMPLANT
SUT PLAIN 2 0 XLH (SUTURE) ×2 IMPLANT
TOWEL OR 17X24 6PK STRL BLUE (TOWEL DISPOSABLE) ×4 IMPLANT
TRAY FOLEY CATH 14FR (SET/KITS/TRAYS/PACK) ×2 IMPLANT
WATER STERILE IRR 1000ML POUR (IV SOLUTION) IMPLANT

## 2011-05-29 NOTE — Transfer of Care (Signed)
Immediate Anesthesia Transfer of Care Note  Patient: Jennifer Haas  Procedure(s) Performed:  CERCLAGE ABDOMINAL - need to use mersilene band  Patient Location: PACU  Anesthesia Type: Spinal  Level of Consciousness: awake, alert  and oriented  Airway & Oxygen Therapy: Patient Spontanous Breathing and Patient connected to nasal cannula oxygen  Post-op Assessment: Report given to PACU RN and Post -op Vital signs reviewed and stable  Post vital signs: Reviewed and stable  Complications: No apparent anesthesia complications

## 2011-05-29 NOTE — Anesthesia Postprocedure Evaluation (Signed)
Anesthesia Post Note  Patient: Jennifer Haas  Procedure(s) Performed:  CERCLAGE ABDOMINAL - need to use mersilene band  Anesthesia type: Spinal  Patient location: PACU  Post pain: Pain level controlled  Post assessment: Post-op Vital signs reviewed  Last Vitals:  Filed Vitals:   05/29/11 0930  BP: 104/77  Pulse: 74  Temp:   Resp: 16    Post vital signs: Reviewed  Level of consciousness: awake  Complications: No apparent anesthesia complications

## 2011-05-29 NOTE — Brief Op Note (Signed)
05/29/2011  8:39 AM  PATIENT:  Jennifer Haas  36 y.o. female  PRE-OPERATIVE DIAGNOSIS:  incompetent cervix  POST-OPERATIVE DIAGNOSIS:  incompetent cervix  PROCEDURE:  Procedure(s): CERCLAGE ABDOMINAL  SURGEON:  Surgeon(s): Juluis Mire, MD Meriel Pica, MD  PHYSICIAN ASSISTANT:   ASSISTANTS: richard holland   ANESTHESIA:   spinal  EBL:  Total I/O In: 1800 [I.V.:1800] Out: 400 [Urine:200; Blood:200]  BLOOD ADMINISTERED:none  DRAINS: Urinary Catheter (Foley)   LOCAL MEDICATIONS USED:  NONE  SPECIMEN:  No Specimen  DISPOSITION OF SPECIMEN:  N/A  COUNTS:  YES  TOURNIQUET:  * No tourniquets in log *  DICTATION: .Other Dictation: Dictation Number [id number]  PLAN OF CARE: Admit to inpatient   PATIENT DISPOSITION:  PACU - hemodynamically stable.   Delay start of Pharmacological VTE agent (>24hrs) due to surgical blood loss or risk of bleeding:  {YES/NO/NOT APPLICABLE:20182

## 2011-05-29 NOTE — Anesthesia Procedure Notes (Signed)
Spinal  Patient location during procedure: OR Start time: 05/29/2011 7:25 AM Staffing Performed by: anesthesiologist  Preanesthetic Checklist Completed: patient identified, site marked, surgical consent, pre-op evaluation, timeout performed, IV checked, risks and benefits discussed and monitors and equipment checked Spinal Block Patient position: sitting Prep: site prepped and draped and DuraPrep Patient monitoring: blood pressure, continuous pulse ox and heart rate Approach: midline Location: L3-4 Injection technique: single-shot Needle Needle type: Pencan  Needle gauge: 24 G Needle length: 9 cm Assessment Sensory level: T4 Additional Notes Clear free flow CSF on second attempt (first attempt with sprotte bent needle).  Patient tolerated procedure well.  Jasmine December, MD

## 2011-05-29 NOTE — H&P (Signed)
  History and physical exam unchanged 

## 2011-05-29 NOTE — Progress Notes (Signed)
Fetal heart rate 150 by Dr. Arelia Sneddon in PACU

## 2011-05-29 NOTE — Anesthesia Preprocedure Evaluation (Signed)
Anesthesia Evaluation  Patient identified by MRN, date of birth, ID band Patient awake    Reviewed: Allergy & Precautions, H&P , NPO status , Patient's Chart, lab work & pertinent test results  Airway Mallampati: II TM Distance: >3 FB Neck ROM: full    Dental No notable dental hx. (+) Teeth Intact   Pulmonary neg pulmonary ROS,  clear to auscultation  Pulmonary exam normal       Cardiovascular neg cardio ROS     Neuro/Psych Negative Neurological ROS  Negative Psych ROS   GI/Hepatic negative GI ROS, Neg liver ROS,   Endo/Other  Negative Endocrine ROS  Renal/GU negative Renal ROS  Genitourinary negative   Musculoskeletal negative musculoskeletal ROS (+)   Abdominal Normal abdominal exam  (+)   Peds negative pediatric ROS (+)  Hematology negative hematology ROS (+)   Anesthesia Other Findings   Reproductive/Obstetrics negative OB ROS                           Anesthesia Physical Anesthesia Plan  ASA: II  Anesthesia Plan: Spinal   Post-op Pain Management:    Induction:   Airway Management Planned:   Additional Equipment:   Intra-op Plan:   Post-operative Plan:   Informed Consent: I have reviewed the patients History and Physical, chart, labs and discussed the procedure including the risks, benefits and alternatives for the proposed anesthesia with the patient or authorized representative who has indicated his/her understanding and acceptance.     Plan Discussed with: CRNA  Anesthesia Plan Comments:         Anesthesia Quick Evaluation

## 2011-05-29 NOTE — Op Note (Signed)
NAMEKERIN, KREN NO.:  [id number]  MEDICAL RECORD NO.:  [id number]  LOCATION:  [facility]                          FACILITY:  [facility]  PHYSICIAN:  Juluis Mire, M.D.   DATE OF BIRTH:  [birth year]  DATE OF PROCEDURE:  05/29/2011 DATE OF DISCHARGE:                              OPERATIVE REPORT   PREOPERATIVE DIAGNOSIS:  Interim pregnancy at 14+ weeks with incompetent cervix with failed cerclage.  POSTOPERATIVE DIAGNOSIS:  Interim pregnancy at 14+ weeks with incompetent cervix with failed cerclage.  OPERATIVE PROCEDURE:  Abdominal cerclage.  SURGEON:  Juluis Mire, M.D.  ASSISTANT:  Duke Salvia. Marcelle Overlie, M.D.  ANESTHESIA:  Spinal.  ESTIMATED BLOOD LOSS:  200 mL.  DRAINS:  Urethral Foley.  INTRAOPERATIVE BLOOD PLACED:  None.  COMPLICATIONS:  None.  DESCRIPTION OF PROCEDURE:  The patient was taken to OR and placed in supine position.  After satisfactory level of spinal anesthesia was obtained, the abdomen was prepped out with Betadine and draped in sterile field.  Low transverse skin incision was made with knife, carried through subcutaneous tissue.  Fascia was entered sharply and incision was fashioned laterally.  Fascia was taken off the muscle superiorly ands inferiorly.  Rectus muscles were separated in the midline.  Perineum was entered sharply.  Incision of peritoneum extended both superiorly and inferiorly.  Uterus was delivered through the incision.  She had some adhesions on the left side that were taken down. A low transverse bladder flap was developed.  Using the Mersilene band, we first went to the left side.  We identified the level of the urethrovesical junction.  The needle was placed close to the uterus, avoiding the vessels and brought through to the posterior side.  We then brought the needle round and on the right side we went from the posterior aspect of the broad ligament, again hugging the lower vesicouterine junction and brought it  out through the anterior portion. The needle was then cut off.  We tied the band securely in the front. Not over tightening but providing good support to the ureteral cervical junction.  There was a venous ooze on the right side but controlled with figure-of-eight of 0 chromic.  Bladder flap was closed in running suture of 2-0 chromic.  Uterus returned abdominal cavity.  We visualized. There is no active bleeding either anteriorly or posteriorly.  Urine output was clear and adequate.  Perineum was closed with running suture of 3-0 chromic.  The fascia was closed with a double suture of 0 PDS.  Subcu was closed with interrupted sutures of 0 plain catgut.  Skin was closed in running subcuticular of 3-0 Monocryl and Dermabond.  Urine output remained clear and adequate.  Sponge, instrument, and needle count was correct by circulating nurse x2.  The patient returned to recovery room in good condition.     Juluis Mire, M.D.     JSM/MEDQ  D:  05/29/2011  T:  05/29/2011  Job:  [id number]

## 2011-05-30 LAB — CBC
Hemoglobin: 10.5 g/dL — ABNORMAL LOW (ref 12.0–15.0)
MCH: 27.9 pg (ref 26.0–34.0)
Platelets: 232 10*3/uL (ref 150–400)
RBC: 3.76 MIL/uL — ABNORMAL LOW (ref 3.87–5.11)
WBC: 8.2 10*3/uL (ref 4.0–10.5)

## 2011-05-30 NOTE — Progress Notes (Signed)
Patient ID: Jennifer Haas, female   DOB: May 07, 1975, 36 y.o.   MRN: [id number] S:  TOLERATING DIET JUSTED D/C'ED FOLEY O:  AF VSS       ABD SOFT POST BS       INC CLEAR       HGB: 10.5 A: POD 1 S/P ABD CERCLAGE P: D/C IV

## 2011-05-30 NOTE — Progress Notes (Signed)
Patient ID: Jennifer Haas, female   DOB: 07/23/1975, 36 y.o.   MRN: [id number] Patient is 36 yo G7 P1413  That had Abdominal cerclage yesterday at 14 5/7 weeks. Today denies abd cramping or VB.  ABd soft Has voided, + flatus. - BM Ambulated to BR with some soreness. Hgb. 10.5 gm/dl and WBC 8.2 Patient stable Continue with post-op observation with plan to discharge in am.  Patient does have follow -up appointment in office in 5 days

## 2011-05-30 NOTE — Progress Notes (Signed)
UR Chart review completed.  

## 2011-05-31 MED ORDER — OXYCODONE-ACETAMINOPHEN 5-325 MG PO TABS: 1.0000 | ORAL_TABLET | Freq: Four times a day (QID) | ORAL | Status: AC | PRN

## 2011-05-31 NOTE — Progress Notes (Signed)
2 Days Post-Op Procedure(s): CERCLAGE ABDOMINAL  Subjective: Patient reports tolerating PO.    Objective: I have reviewed patient's vital signs, intake and output and labs.  FHR 142 by doppler  Assessment: s/p Procedure(s): CERCLAGE ABDOMINAL: stable  Plan: Discharge home  LOS: 2 days    Shareen Capwell M 05/31/2011, 9:46 AM

## 2011-05-31 NOTE — Discharge Summary (Signed)
Physician Discharge Summary  Patient ID: EUDORA GUEVARRA MRN: [id number] DOB/AGE: 37-Sep-1977 37 y.o.  Admit date: 05/29/2011 Discharge date: 05/31/2011  Admission Diagnoses:  Discharge Diagnoses:  Active Problems:  * No active hospital problems. *    Discharged Condition: good  Hospital Course: Laparotomy with abd cerclage, diet ^ POD #1 and cath D/C'd Stable FHR POD #2, Inc C/D and ready for D/C, doppler FHR stable  Consults: None  Significant Diagnostic Studies: labs: Treatments: surgery: abd cerclage Results for orders placed during the hospital encounter of 05/29/11 (from the past 48 hour(s))  CBC     Status: Abnormal   Collection Time   05/30/11  5:25 AM      Component Value Range Comment   WBC 8.2  4.0 - 10.5 (K/uL)    RBC 3.76 (*) 3.87 - 5.11 (MIL/uL)    Hemoglobin 10.5 (*) 12.0 - 15.0 (g/dL)    HCT 40.9 (*) 81.1 - 46.0 (%)    MCV 85.1  78.0 - 100.0 (fL)    MCH 27.9  26.0 - 34.0 (pg)    MCHC 32.8  30.0 - 36.0 (g/dL)    RDW 91.4  78.2 - 95.6 (%)    Platelets 232  150 - 400 (K/uL)     Discharge Exam: Blood pressure 117/66, pulse 77, temperature 98.1 F (36.7 C), temperature source Oral, resp. rate 18, height 5\' 6"  (1.676 m), weight 190 lb (86.183 kg), last menstrual period 02/16/2011, SpO2 100.00%. Incision/Wound:C/D, abd soft + BS  Disposition: Home or Self Care   Medication List  As of 05/31/2011  9:48 AM   TAKE these medications         oxyCODONE-acetaminophen 5-325 MG per tablet   Commonly known as: PERCOCET   Take 1-2 tablets by mouth every 6 (six) hours as needed (moderate to severe pain (when tolerating fluids)).      prenatal multivitamin Tabs   Take 1 tablet by mouth daily.           Follow-up Information    Follow up with [facility] S, MD in 7 days.   Contact information:   [facility] [address], [address] [place] [address] [phone]          Signed: Meriel Pica 05/31/2011,  9:48 AM

## 2011-05-31 NOTE — Progress Notes (Addendum)
05/31/11 1115 D/C home instructions/prescriptions given, pts questions answered.

## 2011-06-02 ENCOUNTER — Encounter (HOSPITAL_COMMUNITY): Payer: Self-pay | Admitting: Obstetrics and Gynecology

## 2011-07-09 ENCOUNTER — Other Ambulatory Visit (HOSPITAL_COMMUNITY): Payer: Self-pay | Admitting: Obstetrics and Gynecology

## 2011-07-09 DIAGNOSIS — IMO0002 Reserved for concepts with insufficient information to code with codable children: Secondary | ICD-10-CM

## 2011-07-09 DIAGNOSIS — Z0489 Encounter for examination and observation for other specified reasons: Secondary | ICD-10-CM

## 2011-07-16 ENCOUNTER — Ambulatory Visit (HOSPITAL_COMMUNITY)
Admission: RE | Admit: 2011-07-16 | Discharge: 2011-07-16 | Disposition: A | Discharge status: Home / Self Care | Payer: Medicaid Other | Source: Ambulatory Visit | Attending: Obstetrics and Gynecology | Admitting: Obstetrics and Gynecology

## 2011-07-16 ENCOUNTER — Other Ambulatory Visit (HOSPITAL_COMMUNITY): Payer: Self-pay | Admitting: Obstetrics and Gynecology

## 2011-07-16 ENCOUNTER — Encounter (HOSPITAL_COMMUNITY): Payer: Self-pay

## 2011-07-16 DIAGNOSIS — Z1389 Encounter for screening for other disorder: Secondary | ICD-10-CM | POA: Insufficient documentation

## 2011-07-16 DIAGNOSIS — O3430 Maternal care for cervical incompetence, unspecified trimester: Secondary | ICD-10-CM

## 2011-07-16 DIAGNOSIS — Z0489 Encounter for examination and observation for other specified reasons: Secondary | ICD-10-CM

## 2011-07-16 DIAGNOSIS — O358XX0 Maternal care for other (suspected) fetal abnormality and damage, not applicable or unspecified: Secondary | ICD-10-CM | POA: Insufficient documentation

## 2011-07-16 DIAGNOSIS — O09529 Supervision of elderly multigravida, unspecified trimester: Secondary | ICD-10-CM | POA: Insufficient documentation

## 2011-07-16 DIAGNOSIS — O3410 Maternal care for benign tumor of corpus uteri, unspecified trimester: Secondary | ICD-10-CM | POA: Insufficient documentation

## 2011-07-16 DIAGNOSIS — IMO0002 Reserved for concepts with insufficient information to code with codable children: Secondary | ICD-10-CM

## 2011-07-16 DIAGNOSIS — O09299 Supervision of pregnancy with other poor reproductive or obstetric history, unspecified trimester: Secondary | ICD-10-CM | POA: Insufficient documentation

## 2011-07-16 DIAGNOSIS — Z8751 Personal history of pre-term labor: Secondary | ICD-10-CM | POA: Insufficient documentation

## 2011-07-16 DIAGNOSIS — Z363 Encounter for antenatal screening for malformations: Secondary | ICD-10-CM | POA: Insufficient documentation

## 2011-07-16 HISTORY — DX: Essential (primary) hypertension: I10

## 2011-07-16 HISTORY — DX: Ventricular premature depolarization: I49.3

## 2011-08-10 ENCOUNTER — Inpatient Hospital Stay (HOSPITAL_COMMUNITY)
Admission: AD | Admit: 2011-08-10 | Discharge: 2011-08-10 | Disposition: A | Discharge status: Hospice | Payer: Medicaid Other | Source: Ambulatory Visit | Attending: Obstetrics and Gynecology | Admitting: Obstetrics and Gynecology

## 2011-08-10 ENCOUNTER — Encounter (HOSPITAL_COMMUNITY): Payer: Self-pay | Admitting: *Deleted

## 2011-08-10 ENCOUNTER — Inpatient Hospital Stay (HOSPITAL_COMMUNITY): Payer: Medicaid Other

## 2011-08-10 DIAGNOSIS — O99891 Other specified diseases and conditions complicating pregnancy: Secondary | ICD-10-CM | POA: Insufficient documentation

## 2011-08-10 DIAGNOSIS — N883 Incompetence of cervix uteri: Secondary | ICD-10-CM

## 2011-08-10 DIAGNOSIS — O3430 Maternal care for cervical incompetence, unspecified trimester: Secondary | ICD-10-CM | POA: Insufficient documentation

## 2011-08-10 DIAGNOSIS — R109 Unspecified abdominal pain: Secondary | ICD-10-CM | POA: Insufficient documentation

## 2011-08-10 LAB — URINE MICROSCOPIC-ADD ON

## 2011-08-10 LAB — URINALYSIS, ROUTINE W REFLEX MICROSCOPIC
Bilirubin Urine: NEGATIVE
Glucose, UA: NEGATIVE mg/dL
Ketones, ur: NEGATIVE mg/dL
Nitrite: NEGATIVE
pH: 6 (ref 5.0–8.0)

## 2011-08-10 NOTE — Discharge Instructions (Signed)
Cervical Insufficiency Cervical insufficiency (CI) is when the cervix is not strong enough to keep a baby (fetus) inside the womb (uterus). It occurs in the 2nd and early 3rd trimesters of pregnancy. The cervix will enlarge (dilate) on its own without contractions. When this happens, the membranes around the fetus will often balloon down into the birth canal (vagina). The membranes may break, which could end the pregnancy (miscarriage). CAUSES  The cause of a cervical insufficiency is often not known. Possible causes include:  Injury to the cervix from a past pregnancy.   Injury to the cervix from past surgeries.   Being born with this defect of the cervix.   Cold cone, laser or LEEP (Loop electrocautery excision procedure) to the cervix.   Over dilating the cervix during an abortion.   Being exposed to DES (diethylstilbestrol) during pregnancy.   Lack of tissue (elastin and collagen) in the cervix that holds the baby in uterus.   Shorter cervix than normal.  SYMPTOMS   Spotting or bleeding from the vagina.   Feeling pressure in the vagina.   Unusual or abnormal vaginal discharge.  DIAGNOSIS   In many cases, the diagnosis is not made until after the pregnancy is lost.   Often this diagnosis will be made by exam.   Sometimes, an ultrasound of the cervix may be helpful. The ultrasound measures and follows the length of the cervix in women who are at risk of having CI.   Your caregiver may follow the dilatation of the cervix. Often, the diagnosis cannot be made until it happens. When this is the case, there is a much greater chance of early loss of the pregnancy. This means the baby is born too early to survive outside of the mother.  PREVENTION   A high risk patient needs to get frequent vaginal exams and serial ultrasounds.   Tie a suture, like a purse string, around the cervix (cerclage), before getting pregnant.  TREATMENT  When CI is diagnosed early, the treatment is a  cerclage. This gives the cervix added support. The cerclage helps carry the baby to term. This is usually done before the first trimester (12 to 14 weeks). Cerclage is usually not done after the second trimester (24 weeks) unless it is an emergency. Your caregiver can discuss the risks of this procedure. The cerclage suture may be removed when labor begins or at term before labor begins. The suture can also be left in place for future pregnancies. If left in place, the baby is delivered by Cesarean section. HOME CARE INSTRUCTIONS   Keep your follow-up prenatal appointments.   Take medication as directed by your caregiver.   Avoid physical activities, exercise and sexual intercourse until you have permission from your caregiver.   Do not douche or use tampons.   Resume your usual diet.  SEEK MEDICAL CARE IF:  You develop abnormal vaginal discharge. SEEK IMMEDIATE MEDICAL CARE IF:   You have a fever.   You develop uterine contractions.   You do not feel the baby moving or the baby is not moving as much as usual.   You pass out.   You have vaginal bleeding.   You are leaking fluid or have a gush of fluid from your vagina.   You have blood in your urine or pain when urinating.  Document Released: 04/21/2005 Document Revised: 04/10/2011 Document Reviewed: 08/09/2008 [employer] Patient Information 2012 [employer]. 

## 2011-08-10 NOTE — MAU Provider Note (Signed)
  History     CSN: [id number]  Arrival date and time: 08/10/11 1559   First Provider Initiated Contact with Patient 08/10/11 1622      No chief complaint on file.  HPI This is a 36 y.o. at [redacted]w[redacted]d who presents with c/o lower abdominal pain and pressure. Has abdominal cerclage due to multiple preterm births. Denies leaking or bleeding.  OB History    Grav Para Term Preterm Abortions TAB SAB Ect Mult Living   7 5 1 4 1  0 1 0 0 3      Past Medical History  Diagnosis Date  . Incompetence of cervix   . Prior pregnancy with fetal demise   . Depression     post partum  . Hypertension   . PVC's (premature ventricular contractions)     Past Surgical History  Procedure Date  . Leep   . Cervical cerclage   . Abdominal cerclage 05/29/2011    Procedure: CERCLAGE ABDOMINAL;  Surgeon: Juluis Mire, MD;  Location: [facility];  Service: Gynecology;  Laterality: N/A;  need to use mersilene band    Family History  Problem Relation Age of Onset  . Diabetes Mother   . Diabetes Father   . Heart disease Father   . Hypertension Father     History  Substance Use Topics  . Smoking status: Never Smoker   . Smokeless tobacco: Not on file  . Alcohol Use: No    Allergies: No Known Allergies  Prescriptions prior to admission  Medication Sig Dispense Refill  . NIFEdipine (PROCARDIA) 10 MG capsule Take 10 mg by mouth 2 (two) times daily as needed. contractions      . Prenatal Vit-Fe Fumarate-FA (PRENATAL MULTIVITAMIN) TABS Take 1 tablet by mouth daily.        Review of Systems  Constitutional: Negative for fever.  Gastrointestinal: Positive for abdominal pain. Negative for nausea, vomiting, diarrhea and constipation.  Genitourinary: Negative for dysuria.    Physical Exam   Last menstrual period 02/16/2011.  Physical Exam  Constitutional: She is oriented to person, place, and time. She appears well-developed and well-nourished. No distress.  HENT:  Head: Normocephalic.    Cardiovascular: Normal rate.   Respiratory: Effort normal.  GI: Soft. She exhibits no distension. There is no tenderness. There is no rebound and no guarding.  Genitourinary: Vagina normal and uterus normal. No vaginal discharge found.       Cervix long and closed, but soft. Cerclage nonpalpable, as expected  Musculoskeletal: Normal range of motion.  Neurological: She is alert and oriented to person, place, and time.  Skin: Skin is warm and dry.  Psychiatric: She has a normal mood and affect.  FHR reassuring Very mild irritability at times, no contractions  MAU Course  Procedures  MDM Unable to do FFN due to recent IC.  Cervical length 3.9cm. Discussed with Dr Renaldo Fiddler >> d/c home with instructions to return to office this week.   Assessment and Plan  A:  SIUP at [redacted]w[redacted]d      Incompetent cervix      No evidence of PTL P:  D/C home  [facility] 08/10/2011, 4:43 PM

## 2011-10-08 ENCOUNTER — Encounter: Payer: Medicaid Other | Attending: Obstetrics and Gynecology | Admitting: Dietician

## 2011-10-08 ENCOUNTER — Encounter: Payer: Self-pay | Admitting: Dietician

## 2011-10-08 DIAGNOSIS — Z713 Dietary counseling and surveillance: Secondary | ICD-10-CM | POA: Insufficient documentation

## 2011-10-08 DIAGNOSIS — O99810 Abnormal glucose complicating pregnancy: Secondary | ICD-10-CM | POA: Insufficient documentation

## 2011-10-08 NOTE — Progress Notes (Signed)
  Patient was seen on 10/08/2011 for Gestational Diabetes self-management class at the [facility]. The following learning objectives were met by the patient during this course:   States the definition of Gestational Diabetes  States why dietary management is important in controlling blood glucose  Describes the effects each nutrient has on blood glucose levels  Demonstrates ability to create a balanced meal plan  Demonstrates carbohydrate counting   States when to check blood glucose levels  Demonstrates proper blood glucose monitoring techniques  States the effect of stress and exercise on blood glucose levels  States the importance of limiting caffeine and abstaining from alcohol and smoking  Blood glucose monitor given: Accu-Check SmartView Meter Kit (Accu-Chek SmartView Strips and FastClix lancets) Lot # [id number] Exp: 02/01/2013 Blood glucose reading: 75  At 10:40 Am  Patient instructed to monitor glucose levels:Fasting and Two hours after the first bite of each meal. FBS: 60 - <90 2 hour: <120  Patient received handouts:  Nutrition Diabetes and Pregnancy  Carbohydrate Counting List  Patient will be seen for follow-up as needed.

## 2011-10-14 ENCOUNTER — Inpatient Hospital Stay (HOSPITAL_COMMUNITY)
Admission: AD | Admit: 2011-10-14 | Discharge: 2011-10-16 | DRG: 778 | Disposition: A | Discharge status: Home / Self Care | Payer: Medicaid Other | Source: Ambulatory Visit | Attending: Obstetrics and Gynecology | Admitting: Obstetrics and Gynecology

## 2011-10-14 ENCOUNTER — Encounter (HOSPITAL_COMMUNITY): Payer: Self-pay | Admitting: *Deleted

## 2011-10-14 DIAGNOSIS — O479 False labor, unspecified: Secondary | ICD-10-CM

## 2011-10-14 DIAGNOSIS — O99810 Abnormal glucose complicating pregnancy: Secondary | ICD-10-CM | POA: Diagnosis present

## 2011-10-14 DIAGNOSIS — O4700 False labor before 37 completed weeks of gestation, unspecified trimester: Principal | ICD-10-CM | POA: Diagnosis present

## 2011-10-14 DIAGNOSIS — O368390 Maternal care for abnormalities of the fetal heart rate or rhythm, unspecified trimester, not applicable or unspecified: Secondary | ICD-10-CM | POA: Diagnosis present

## 2011-10-14 HISTORY — DX: Gestational diabetes mellitus in pregnancy, unspecified control: O24.419

## 2011-10-14 LAB — CBC
Hemoglobin: 12 g/dL (ref 12.0–15.0)
Platelets: 211 10*3/uL (ref 150–400)
RBC: 4.53 MIL/uL (ref 3.87–5.11)
WBC: 6.8 10*3/uL (ref 4.0–10.5)

## 2011-10-14 LAB — URINE MICROSCOPIC-ADD ON

## 2011-10-14 LAB — GLUCOSE, CAPILLARY: Glucose-Capillary: 76 mg/dL (ref 70–99)

## 2011-10-14 LAB — URINALYSIS, ROUTINE W REFLEX MICROSCOPIC
Nitrite: NEGATIVE
Specific Gravity, Urine: 1.02 (ref 1.005–1.030)
pH: 6 (ref 5.0–8.0)

## 2011-10-14 LAB — RPR: RPR Ser Ql: NONREACTIVE

## 2011-10-14 MED ORDER — MAGNESIUM SULFATE BOLUS VIA INFUSION
4.0000 g | Freq: Once | INTRAVENOUS | Status: AC
  Administered 2011-10-14: 4 g via INTRAVENOUS
  Filled 2011-10-14: qty 500

## 2011-10-14 MED ORDER — PRENATAL MULTIVITAMIN CH
1.0000 | ORAL_TABLET | Freq: Every day | ORAL | Status: DC
  Administered 2011-10-14 – 2011-10-15 (×2): 1 via ORAL
  Filled 2011-10-14 (×2): qty 1

## 2011-10-14 MED ORDER — ZOLPIDEM TARTRATE 5 MG PO TABS: 5.0000 mg | ORAL_TABLET | Freq: Every evening | ORAL | Status: DC | PRN

## 2011-10-14 MED ORDER — BUTORPHANOL TARTRATE 2 MG/ML IJ SOLN
1.0000 mg | INTRAMUSCULAR | Status: DC | PRN
  Administered 2011-10-14 – 2011-10-16 (×3): 1 mg via INTRAVENOUS
  Filled 2011-10-14 (×3): qty 1

## 2011-10-14 MED ORDER — DOCUSATE SODIUM 100 MG PO CAPS
100.0000 mg | ORAL_CAPSULE | Freq: Every day | ORAL | Status: DC
  Administered 2011-10-14 – 2011-10-15 (×2): 100 mg via ORAL
  Filled 2011-10-14 (×2): qty 1

## 2011-10-14 MED ORDER — LACTATED RINGERS IV SOLN
INTRAVENOUS | Status: DC
  Administered 2011-10-14: 18:00:00 via INTRAVENOUS
  Administered 2011-10-14: 100 mL/h via INTRAVENOUS
  Administered 2011-10-15 – 2011-10-16 (×3): via INTRAVENOUS

## 2011-10-14 MED ORDER — TERBUTALINE SULFATE 1 MG/ML IJ SOLN
0.2500 mg | Freq: Once | INTRAMUSCULAR | Status: AC
  Administered 2011-10-14: 0.25 mg via SUBCUTANEOUS
  Filled 2011-10-14: qty 1

## 2011-10-14 MED ORDER — LACTATED RINGERS IV SOLN
INTRAVENOUS | Status: DC
  Administered 2011-10-14 – 2011-10-16 (×2): via INTRAVENOUS

## 2011-10-14 MED ORDER — CALCIUM CARBONATE ANTACID 500 MG PO CHEW: 2.0000 | CHEWABLE_TABLET | ORAL | Status: DC | PRN

## 2011-10-14 MED ORDER — MAGNESIUM SULFATE 40 G IN LACTATED RINGERS - SIMPLE
2.5000 g/h | INTRAVENOUS | Status: DC
  Administered 2011-10-14 – 2011-10-15 (×2): 2.5 g/h via INTRAVENOUS
  Filled 2011-10-14 (×3): qty 500

## 2011-10-14 MED ORDER — ACETAMINOPHEN 325 MG PO TABS
650.0000 mg | ORAL_TABLET | ORAL | Status: DC | PRN
  Administered 2011-10-14 – 2011-10-16 (×5): 650 mg via ORAL
  Filled 2011-10-14 (×5): qty 2

## 2011-10-14 NOTE — H&P (Signed)
Jennifer Haas is a 36 y.o. female at 34 weeks and 2 days presents with preterm labor. A she'll has an abdominal cerclage in place for history of incompetent cervix. She is a gestational diabetic controlled with diet. She reported increasing uterine activity last night and reported to MAU this morning. Regular contractions were noted. This did not respond to hydration or subcutaneous terbutaline. She was admitted for magnesium sulfate tocolysis.  She denies any leakage or bleeding at the present time.. Maternal Medical History:  Reason for admission: Reason for admission: contractions.  Contractions: Onset was 13-24 hours ago.   Frequency: regular.   Perceived severity is moderate.    Fetal activity: Perceived fetal activity is normal.    Prenatal complications: No bleeding.   Prenatal Complications - Diabetes: gestational. Diabetes is managed by diet.      OB History    Grav Para Term Preterm Abortions TAB SAB Ect Mult Living   7 6 1 5  0 0 0 0 0 3     Past Medical History  Diagnosis Date  . Incompetence of cervix   . Prior pregnancy with fetal demise   . PVC's (premature ventricular contractions)   . Gestational diabetes     With G4 diet controlled   Past Surgical History  Procedure Date  . Leep   . Cervical cerclage   . Abdominal cerclage 05/29/2011    Procedure: CERCLAGE ABDOMINAL;  Surgeon: Juluis Mire, MD;  Location: [facility];  Service: Gynecology;  Laterality: N/A;  need to use mersilene band   Family History: family history includes Diabetes in her father and mother; Heart disease in her father; and Hypertension in her father. Social History:  reports that she has never smoked. She has never used smokeless tobacco. She reports that she does not drink alcohol or use illicit drugs.  ROS  Dilation: Closed Effacement (%): 50 Station: -2 Exam by:: Dr Arelia Sneddon Blood pressure 116/72, pulse 95, temperature 98.5 F (36.9 C), temperature source Oral, resp. rate 20, height 5'  6" (1.676 m), weight 86.41 kg (190 lb 8 oz), last menstrual period 02/16/2011. Maternal Exam:  Uterine Assessment: Contraction strength is mild.  Contraction frequency is irregular.   Abdomen: Patient reports no abdominal tenderness. Fundal height is 34 cm.   Estimated fetal weight is 45.   Fetal presentation: vertex  Pelvis: adequate for delivery.   Cervix: Cervix evaluated by digital exam.     Physical Exam  Cardiovascular: Normal rate, regular rhythm and normal heart sounds.   Respiratory: Effort normal and breath sounds normal.  GI:       Gravid uterus nontender well healed low transverse incision  Genitourinary:       Cervix 50 % effaced closed vtx -2 BOWI    Prenatal labs: ABO, Rh: --/--/O POS (01/13 4098) Antibody: Negative (12/26 0000) Rubella: Immune (12/26 0000) RPR: Nonreactive (12/26 0000)  HBsAg: Negative (12/26 0000)  HIV: Non-reactive (12/26 0000)  GBS:     Assessment/Plan: Intrauterine pregnancy at 34 weeks and 2 days with preterm uterine activity. Abdominal cerclage in place. Diet controlled gestational diabetes.  Patient we brought in for magnesium sulfate Toprol lysis of discussed at magnesium sulfate is now considered category D. Hadn't used for a prolonged period of time he can lead to bone loss issues in the infant with associated fractures. These changes do reverse after the baby is born. Hopefully will use this for less than 48 hours. The risk of premature delivery of the infant has been  discussed. Discussed potential risk of pulmonary immaturity requiring a stay in neonatal intensive care unit steroids are not indicated that she is beyond 34 weeks.  Caydence Koenig S 10/14/2011, 10:27 AM

## 2011-10-14 NOTE — MAU Provider Note (Signed)
  History     CSN: [id number]  Arrival date and time: 10/14/11 6213   First Provider Initiated Contact with Patient 10/14/11 0559      Chief Complaint  Patient presents with  . Contractions   HPI This is a 36 y.o. female at [redacted]w[redacted]d who presents with c/o contractions and leg pain since about 5pm. Denies leakingo or bleeding. History is remarkable for multiple severely preterm births and losses related to incompetent cervix.  Has abdominal cerclage placed by Dr Arelia Sneddon. Has been on procardia at home.  Was ordered to be on progesterone, but did not get it filled. Has C/S scheduled for mid-July.  OB History    Grav Para Term Preterm Abortions TAB SAB Ect Mult Living   7 5 1 4 1  0 1 0 0 3      Past Medical History  Diagnosis Date  . Incompetence of cervix   . Prior pregnancy with fetal demise   . PVC's (premature ventricular contractions)     Past Surgical History  Procedure Date  . Leep   . Cervical cerclage   . Abdominal cerclage 05/29/2011    Procedure: CERCLAGE ABDOMINAL;  Surgeon: Juluis Mire, MD;  Location: [facility];  Service: Gynecology;  Laterality: N/A;  need to use mersilene band    Family History  Problem Relation Age of Onset  . Diabetes Mother   . Diabetes Father   . Heart disease Father   . Hypertension Father     History  Substance Use Topics  . Smoking status: Never Smoker   . Smokeless tobacco: Never Used  . Alcohol Use: No    Allergies: No Known Allergies  Prescriptions prior to admission  Medication Sig Dispense Refill  . NIFEdipine (PROCARDIA) 10 MG capsule Take 10 mg by mouth 2 (two) times daily as needed. contractions      . Prenatal Vit-Fe Fumarate-FA (PRENATAL MULTIVITAMIN) TABS Take 1 tablet by mouth daily.        ROS As listed in HPI  Physical Exam   Blood pressure 139/85, pulse 82, temperature 98.9 F (37.2 C), temperature source Oral, resp. rate 20, height 5\' 6"  (1.676 m), weight 190 lb 8 oz (86.41 kg), last menstrual period  02/16/2011.  Physical Exam  Constitutional: She is oriented to person, place, and time. She appears well-developed and well-nourished. No distress.  HENT:  Head: Normocephalic.  Cardiovascular: Normal rate.   Respiratory: Effort normal.  GI: Soft. She exhibits no distension and no mass. There is no tenderness. There is no rebound and no guarding.  Genitourinary: Vagina normal and uterus normal. No vaginal discharge found.       Dilation: Closed Effacement (%): 80 Station: -2 Exam by:: Wynelle Bourgeois CNM Small parts felt through cervix which is thin.   Musculoskeletal: Normal range of motion.  Neurological: She is alert and oriented to person, place, and time.  Skin: Skin is warm and dry.  Psychiatric: She has a normal mood and affect.  FHR reactive UCs every 2-3 minutes  MAU Course  Procedures  MDM Discussed with Dr Rana Snare. Will hydrate with IVF and give a dose of Terb.>>no improvement  Consulted Dr Arelia Sneddon >> Admit for MgSO4   Assessment and Plan  A:  SIUP at [redacted]w[redacted]d      Preterm labor with cervical change/effacement P:  Admit to Antenatal       Magnesium Sulfate      MD to follow  [facility] 10/14/2011, 5:59 AM

## 2011-10-14 NOTE — MAU Note (Signed)
Abdominal cerclage placed Jan 25th. Contractions off and on the entire pregnancy. Tonight contractions are much stronger. About every 5-6 minutes. No vaginal bleeding and good fetal movement. Left leg pain for the past few days which has prevented sleep tonight because the pain is so bad.

## 2011-10-14 NOTE — Progress Notes (Signed)
questionable prolonged decel vs maternal tracing @ 1100

## 2011-10-14 NOTE — Progress Notes (Signed)
UR Chart review completed.  

## 2011-10-15 LAB — GLUCOSE, CAPILLARY
Glucose-Capillary: 77 mg/dL (ref 70–99)
Glucose-Capillary: 74 mg/dL (ref 70–99)

## 2011-10-15 NOTE — Consult Note (Signed)
[facility]  Neonatal Medicine Consultation       10/15/2011    6:07 AM  I was called at the request of the patient's obstetrician (Dr. Rana Snare) to speak to this patient due to preterm labor at 34+ weeks.  She is being treated with magnesium sulfate.  Her membranes are intact.  I reviewed the issues typically encountered with a preterm birth ranging from 34-[redacted] weeks gestation, including respiratory distress, infection, feeding difficulties.  She does not plan to breast feed, however I recommended this as the best nutrition for her baby and encouraged her to reconsider.  If she remains undelivered past 35 weeks, there is an increasing possibility her baby could go to the regular newborn nursery, avoiding the intensive care unit.    _____________________ Electronically Signed By: Angelita Ingles, MD Neonatologist

## 2011-10-15 NOTE — Progress Notes (Signed)
34 3/[redacted] weeks gestation, with PTL, Gestational Diabetes.  Height  66" Weight 194 Lbs pre-pregnancy weight 189 Lbs.Pre-pregnancy  BMI 30.6  IBW 130 Lbs  Total weight gain 5 Lbs. Weight gain goals 11-20 Lbs.   Estimated needs: 19-2100 kcal/day, 70-80 grams protein/day, 2.3 liters fluid/day Gestational Carbohydrate modified  diet tolerated well, appetite good. Oriented to snack menu. Requests double protein portions. Current diet prescription will provide for increased needs. No abnormal nutrition related labs.  CBG (last 3)   Basename 10/15/11 1157 10/15/11 0629 10/14/11 1937  GLUCAP 74 77 104*     Nutrition Dx: Increased nutrient needs r/t pregnancy and fetal growth requirements aeb [redacted] weeks gestation.  No educational needs assessed at this time. Diet education completed at [facility] on 10/08/11

## 2011-10-15 NOTE — Progress Notes (Signed)
Patient is resting this am. Denies contractions. Reports good fetal movement.  Afebrile Vital signs stable  FHR is reactive Abdomen is soft and non tender  IMPRESSION: PTL Abdominal cerclage GDM  PLAN: Continue magnesium for tocolysis today. Consider changing to po medication tomorrow C section for delivery

## 2011-10-16 LAB — GLUCOSE, CAPILLARY

## 2011-10-16 MED ORDER — NIFEDIPINE 10 MG PO CAPS
10.0000 mg | ORAL_CAPSULE | Freq: Four times a day (QID) | ORAL | Status: DC
  Administered 2011-10-16: 10 mg via ORAL
  Filled 2011-10-16: qty 1

## 2011-10-16 MED ORDER — MAGNESIUM SULFATE 40 G IN LACTATED RINGERS - SIMPLE
2.5000 g/h | INTRAVENOUS | Status: DC
  Filled 2011-10-16: qty 500

## 2011-10-16 MED ORDER — NIFEDIPINE 10 MG PO CAPS: 10.0000 mg | ORAL_CAPSULE | Freq: Four times a day (QID) | ORAL | Status: DC

## 2011-10-16 NOTE — Progress Notes (Signed)
34 4/7 weeks No C/O  Wants to go home  VSS Afeb  Uterus NT FHT reactive UCs none, occasional irritability  A: Abdominal cerclage     PT UCs resolved     Gestational DM-diet control  P: Wean magnesium sulfate      Procardia 10mg , q6hrs      Reviewed with patient MBR/PR      FU office 4 days      [facility]

## 2011-10-16 NOTE — Discharge Instructions (Signed)
No vaginal entry No heavy lifting Modified bedrest

## 2011-10-16 NOTE — Discharge Summary (Signed)
Physician Discharge Summary  Patient ID: AYAAN RINGLE MRN: [id number] DOB/AGE: [birth year] 36 y.o.  Admit date: 10/14/2011 Discharge date: 10/16/2011  Admission Diagnoses:pre term labor, abdominal cerclage Discharge Diagnoses: preterm labor, abdominal cerclage Active Problems:  * No active hospital problems. *    Discharged Condition: stable  Hospital Course: IV magnesium sulfate tocolysis done with good resolution of contractions.  Magnesium weaned off and oral nifedipine 10mg , q6 hours started with good tocolysis. Patient had a mild head ache with magnesium that resolved with discontinuation of magnesium. Blood pressures normal.  Consults: None  Significant Diagnostic Studies: labs:  Results for orders placed during the hospital encounter of 10/14/11 (from the past 24 hour(s))  GLUCOSE, CAPILLARY     Status: Normal   Collection Time   10/16/11  6:41 AM      Component Value Range   Glucose-Capillary 71  70 - 99 mg/dL  GLUCOSE, CAPILLARY     Status: Normal   Collection Time   10/16/11 11:55 AM      Component Value Range   Glucose-Capillary 95  70 - 99 mg/dL   Comment 1 Notify RN     Comment 2 Documented in Chart      Treatments: IV hydration and magnesium sulfate  Discharge Exam: Blood pressure 98/61, pulse 77, temperature 98.2 F (36.8 C), temperature source Oral, resp. rate 18, height 5\' 6"  (1.676 m), weight 87.998 kg (194 lb), last menstrual period 02/16/2011, SpO2 97.00%. General appearance: alert, cooperative and no distress Resp: clear to auscultation bilaterally GI: soft, non-tender; bowel sounds normal; no masses,  no organomegaly  Disposition: 50-Hospice/Home  Discharge Orders    Future Orders Please Complete By Expires   OB RESULTS CONSOLE GC/Chlamydia      Comments:   This external order was created through the Results Console.   OB RESULTS CONSOLE RPR      Comments:   This external order was created through the Results Console.   OB RESULTS CONSOLE  HIV antibody      Comments:   This external order was created through the Results Console.   OB RESULTS CONSOLE Rubella Antibody      Comments:   This external order was created through the Results Console.   OB RESULTS CONSOLE Hepatitis B surface antigen      Comments:   This external order was created through the Results Console.   OB RESULTS CONSOLE Antibody Screen      Comments:   This external order was created through the Results Console.     Medication List  As of 10/16/2011  4:48 PM   TAKE these medications         NIFEdipine 10 MG capsule   Commonly known as: PROCARDIA   Take 1 capsule (10 mg total) by mouth every 6 (six) hours.      prenatal multivitamin Tabs   Take 1 tablet by mouth daily.           Follow-up Information    Follow up in 4 days.         Signed: Retta Mac E 10/16/2011, 4:48 PM

## 2011-10-20 ENCOUNTER — Encounter (HOSPITAL_COMMUNITY)
Admission: AD | Disposition: A | Discharge status: Home / Self Care | Payer: Self-pay | Source: Ambulatory Visit | Attending: Obstetrics and Gynecology

## 2011-10-20 ENCOUNTER — Inpatient Hospital Stay (HOSPITAL_COMMUNITY): Payer: Medicaid Other | Admitting: Anesthesiology

## 2011-10-20 ENCOUNTER — Encounter (HOSPITAL_COMMUNITY): Payer: Self-pay | Admitting: *Deleted

## 2011-10-20 ENCOUNTER — Encounter (HOSPITAL_COMMUNITY): Payer: Self-pay | Admitting: Anesthesiology

## 2011-10-20 ENCOUNTER — Inpatient Hospital Stay (HOSPITAL_COMMUNITY)
Admission: AD | Admit: 2011-10-20 | Discharge: 2011-10-23 | DRG: 765 | Disposition: A | Discharge status: Home / Self Care | Payer: Medicaid Other | Source: Ambulatory Visit | Attending: Obstetrics and Gynecology | Admitting: Obstetrics and Gynecology

## 2011-10-20 DIAGNOSIS — O3430 Maternal care for cervical incompetence, unspecified trimester: Principal | ICD-10-CM | POA: Diagnosis present

## 2011-10-20 DIAGNOSIS — R1032 Left lower quadrant pain: Secondary | ICD-10-CM

## 2011-10-20 DIAGNOSIS — O09529 Supervision of elderly multigravida, unspecified trimester: Secondary | ICD-10-CM | POA: Diagnosis present

## 2011-10-20 DIAGNOSIS — Z302 Encounter for sterilization: Secondary | ICD-10-CM

## 2011-10-20 HISTORY — PX: ABDOMINAL CERCLAGE: SHX5384

## 2011-10-20 LAB — CBC
HCT: 35.5 % — ABNORMAL LOW (ref 36.0–46.0)
Hemoglobin: 12 g/dL (ref 12.0–15.0)
MCH: 27.4 pg (ref 26.0–34.0)
MCHC: 33.8 g/dL (ref 30.0–36.0)
RBC: 4.38 MIL/uL (ref 3.87–5.11)

## 2011-10-20 LAB — GLUCOSE, CAPILLARY
Glucose-Capillary: 76 mg/dL (ref 70–99)
Glucose-Capillary: 92 mg/dL (ref 70–99)

## 2011-10-20 SURGERY — CERCLAGE, CERVIX, ABDOMINAL APPROACH
Anesthesia: Spinal | Site: Abdomen | Wound class: Clean Contaminated

## 2011-10-20 MED ORDER — PRENATAL MULTIVITAMIN CH
1.0000 | ORAL_TABLET | Freq: Every day | ORAL | Status: DC
  Administered 2011-10-21 – 2011-10-23 (×3): 1 via ORAL
  Filled 2011-10-20 (×3): qty 1

## 2011-10-20 MED ORDER — LACTATED RINGERS IV SOLN
INTRAVENOUS | Status: DC
  Administered 2011-10-20 (×4): via INTRAVENOUS

## 2011-10-20 MED ORDER — LANOLIN HYDROUS EX OINT: 1.0000 "application " | TOPICAL_OINTMENT | CUTANEOUS | Status: DC | PRN

## 2011-10-20 MED ORDER — MORPHINE SULFATE 0.5 MG/ML IJ SOLN
INTRAMUSCULAR | Status: AC
  Filled 2011-10-20: qty 10

## 2011-10-20 MED ORDER — TETANUS-DIPHTH-ACELL PERTUSSIS 5-2.5-18.5 LF-MCG/0.5 IM SUSP: 0.5000 mL | Freq: Once | INTRAMUSCULAR | Status: DC

## 2011-10-20 MED ORDER — ONDANSETRON HCL 4 MG/2ML IJ SOLN
INTRAMUSCULAR | Status: AC
  Filled 2011-10-20: qty 2

## 2011-10-20 MED ORDER — SODIUM CHLORIDE 0.9 % IV SOLN
1.0000 ug/kg/h | INTRAVENOUS | Status: DC | PRN
  Filled 2011-10-20: qty 2.5

## 2011-10-20 MED ORDER — SCOPOLAMINE 1 MG/3DAYS TD PT72
MEDICATED_PATCH | TRANSDERMAL | Status: AC
  Filled 2011-10-20: qty 1

## 2011-10-20 MED ORDER — DIPHENHYDRAMINE HCL 50 MG/ML IJ SOLN
12.5000 mg | INTRAMUSCULAR | Status: DC | PRN
  Administered 2011-10-20: 22:00:00 via INTRAVENOUS
  Filled 2011-10-20 (×2): qty 1

## 2011-10-20 MED ORDER — OXYTOCIN 10 UNIT/ML IJ SOLN
40.0000 [IU] | INTRAVENOUS | Status: DC | PRN
  Administered 2011-10-20: 40 [IU] via INTRAVENOUS

## 2011-10-20 MED ORDER — FENTANYL CITRATE 0.05 MG/ML IJ SOLN
INTRAMUSCULAR | Status: AC
  Filled 2011-10-20: qty 2

## 2011-10-20 MED ORDER — IBUPROFEN 600 MG PO TABS
600.0000 mg | ORAL_TABLET | Freq: Four times a day (QID) | ORAL | Status: DC | PRN
  Filled 2011-10-20 (×7): qty 1

## 2011-10-20 MED ORDER — DIPHENHYDRAMINE HCL 25 MG PO CAPS
25.0000 mg | ORAL_CAPSULE | ORAL | Status: DC | PRN
  Filled 2011-10-20: qty 1

## 2011-10-20 MED ORDER — ONDANSETRON HCL 4 MG/2ML IJ SOLN: 4.0000 mg | INTRAMUSCULAR | Status: DC | PRN

## 2011-10-20 MED ORDER — OXYTOCIN 10 UNIT/ML IJ SOLN
INTRAMUSCULAR | Status: AC
  Filled 2011-10-20: qty 4

## 2011-10-20 MED ORDER — DIPHENHYDRAMINE HCL 25 MG PO CAPS: 25.0000 mg | ORAL_CAPSULE | Freq: Four times a day (QID) | ORAL | Status: DC | PRN

## 2011-10-20 MED ORDER — WITCH HAZEL-GLYCERIN EX PADS: 1.0000 "application " | MEDICATED_PAD | CUTANEOUS | Status: DC | PRN

## 2011-10-20 MED ORDER — LACTATED RINGERS IV SOLN
INTRAVENOUS | Status: DC
  Administered 2011-10-20: 15:00:00 via INTRAVENOUS

## 2011-10-20 MED ORDER — LACTATED RINGERS IV SOLN
INTRAVENOUS | Status: DC
  Administered 2011-10-20: 23:00:00 via INTRAVENOUS

## 2011-10-20 MED ORDER — KETOROLAC TROMETHAMINE 30 MG/ML IJ SOLN: 30.0000 mg | Freq: Four times a day (QID) | INTRAMUSCULAR | Status: AC | PRN

## 2011-10-20 MED ORDER — CEFAZOLIN SODIUM-DEXTROSE 2-3 GM-% IV SOLR: 2.0000 g | INTRAVENOUS | Status: DC

## 2011-10-20 MED ORDER — EPHEDRINE SULFATE 50 MG/ML IJ SOLN
INTRAMUSCULAR | Status: DC | PRN
  Administered 2011-10-20: 5 mg via INTRAVENOUS
  Administered 2011-10-20: 10 mg via INTRAVENOUS
  Administered 2011-10-20 (×3): 5 mg via INTRAVENOUS
  Administered 2011-10-20 (×2): 10 mg via INTRAVENOUS

## 2011-10-20 MED ORDER — PHENYLEPHRINE 40 MCG/ML (10ML) SYRINGE FOR IV PUSH (FOR BLOOD PRESSURE SUPPORT)
PREFILLED_SYRINGE | INTRAVENOUS | Status: AC
  Filled 2011-10-20: qty 5

## 2011-10-20 MED ORDER — FAMOTIDINE IN NACL 20-0.9 MG/50ML-% IV SOLN
20.0000 mg | Freq: Once | INTRAVENOUS | Status: AC
  Administered 2011-10-20: 20 mg via INTRAVENOUS
  Filled 2011-10-20: qty 50

## 2011-10-20 MED ORDER — DIPHENHYDRAMINE HCL 50 MG/ML IJ SOLN: 25.0000 mg | INTRAMUSCULAR | Status: DC | PRN

## 2011-10-20 MED ORDER — NALBUPHINE HCL 10 MG/ML IJ SOLN
5.0000 mg | INTRAMUSCULAR | Status: DC | PRN
  Filled 2011-10-20: qty 1

## 2011-10-20 MED ORDER — ONDANSETRON HCL 4 MG/2ML IJ SOLN: 4.0000 mg | Freq: Three times a day (TID) | INTRAMUSCULAR | Status: DC | PRN

## 2011-10-20 MED ORDER — BUPIVACAINE IN DEXTROSE 0.75-8.25 % IT SOLN
INTRATHECAL | Status: DC | PRN
  Administered 2011-10-20: 1.5 mL via INTRATHECAL

## 2011-10-20 MED ORDER — PHENYLEPHRINE HCL 10 MG/ML IJ SOLN
INTRAMUSCULAR | Status: DC | PRN
  Administered 2011-10-20 (×2): 80 ug via INTRAVENOUS
  Administered 2011-10-20: 40 ug via INTRAVENOUS
  Administered 2011-10-20 (×3): 80 ug via INTRAVENOUS
  Administered 2011-10-20 (×2): 40 ug via INTRAVENOUS
  Administered 2011-10-20: 80 ug via INTRAVENOUS

## 2011-10-20 MED ORDER — 0.9 % SODIUM CHLORIDE (POUR BTL) OPTIME
TOPICAL | Status: DC | PRN
  Administered 2011-10-20 (×2): 1000 mL

## 2011-10-20 MED ORDER — ONDANSETRON HCL 4 MG/2ML IJ SOLN
INTRAMUSCULAR | Status: DC | PRN
  Administered 2011-10-20: 4 mg via INTRAVENOUS

## 2011-10-20 MED ORDER — MORPHINE SULFATE (PF) 0.5 MG/ML IJ SOLN
INTRAMUSCULAR | Status: DC | PRN
  Administered 2011-10-20: .1 mg via INTRATHECAL

## 2011-10-20 MED ORDER — KETOROLAC TROMETHAMINE 60 MG/2ML IM SOLN
INTRAMUSCULAR | Status: AC
  Filled 2011-10-20: qty 2

## 2011-10-20 MED ORDER — NALOXONE HCL 0.4 MG/ML IJ SOLN: 0.4000 mg | INTRAMUSCULAR | Status: DC | PRN

## 2011-10-20 MED ORDER — OXYTOCIN 40 UNITS IN LACTATED RINGERS INFUSION - SIMPLE MED: 62.5000 mL/h | INTRAVENOUS | Status: AC

## 2011-10-20 MED ORDER — SCOPOLAMINE 1 MG/3DAYS TD PT72
1.0000 | MEDICATED_PATCH | Freq: Once | TRANSDERMAL | Status: DC
  Administered 2011-10-20: 1.5 mg via TRANSDERMAL

## 2011-10-20 MED ORDER — LACTATED RINGERS IV SOLN: INTRAVENOUS | Status: DC

## 2011-10-20 MED ORDER — SENNOSIDES-DOCUSATE SODIUM 8.6-50 MG PO TABS
2.0000 | ORAL_TABLET | Freq: Every day | ORAL | Status: DC
  Administered 2011-10-21 – 2011-10-22 (×2): 2 via ORAL

## 2011-10-20 MED ORDER — EPHEDRINE 5 MG/ML INJ
INTRAVENOUS | Status: AC
  Filled 2011-10-20: qty 10

## 2011-10-20 MED ORDER — SIMETHICONE 80 MG PO CHEW
80.0000 mg | CHEWABLE_TABLET | Freq: Three times a day (TID) | ORAL | Status: DC
  Administered 2011-10-21 – 2011-10-23 (×6): 80 mg via ORAL

## 2011-10-20 MED ORDER — OXYCODONE-ACETAMINOPHEN 5-325 MG PO TABS
1.0000 | ORAL_TABLET | ORAL | Status: DC | PRN
  Administered 2011-10-21 – 2011-10-22 (×5): 1 via ORAL
  Filled 2011-10-20 (×5): qty 1

## 2011-10-20 MED ORDER — CEFAZOLIN SODIUM 1-5 GM-% IV SOLN
INTRAVENOUS | Status: DC | PRN
  Administered 2011-10-20: 2 g via INTRAVENOUS

## 2011-10-20 MED ORDER — FENTANYL CITRATE 0.05 MG/ML IJ SOLN
25.0000 ug | INTRAMUSCULAR | Status: DC | PRN
  Administered 2011-10-20: 100 ug via INTRAVENOUS
  Administered 2011-10-20 (×2): 50 ug via INTRAVENOUS

## 2011-10-20 MED ORDER — HYDROMORPHONE HCL PF 1 MG/ML IJ SOLN
0.5000 mg | Freq: Once | INTRAMUSCULAR | Status: AC
  Administered 2011-10-20: 1 mg via INTRAVENOUS
  Filled 2011-10-20: qty 1

## 2011-10-20 MED ORDER — ZOLPIDEM TARTRATE 5 MG PO TABS: 5.0000 mg | ORAL_TABLET | Freq: Every evening | ORAL | Status: DC | PRN

## 2011-10-20 MED ORDER — FENTANYL CITRATE 0.05 MG/ML IJ SOLN
INTRAMUSCULAR | Status: DC | PRN
  Administered 2011-10-20: 85 ug via INTRAVENOUS
  Administered 2011-10-20: 15 ug via INTRATHECAL

## 2011-10-20 MED ORDER — SIMETHICONE 80 MG PO CHEW: 80.0000 mg | CHEWABLE_TABLET | ORAL | Status: DC | PRN

## 2011-10-20 MED ORDER — LACTATED RINGERS IV SOLN
INTRAVENOUS | Status: DC | PRN
  Administered 2011-10-20: 14:00:00 via INTRAVENOUS

## 2011-10-20 MED ORDER — MEPERIDINE HCL 25 MG/ML IJ SOLN: 6.2500 mg | INTRAMUSCULAR | Status: DC | PRN

## 2011-10-20 MED ORDER — METOCLOPRAMIDE HCL 5 MG/ML IJ SOLN: 10.0000 mg | Freq: Three times a day (TID) | INTRAMUSCULAR | Status: DC | PRN

## 2011-10-20 MED ORDER — BUPIVACAINE HCL (PF) 0.25 % IJ SOLN
INTRAMUSCULAR | Status: AC
  Filled 2011-10-20: qty 30

## 2011-10-20 MED ORDER — FENTANYL CITRATE 0.05 MG/ML IJ SOLN
INTRAMUSCULAR | Status: AC
  Administered 2011-10-20: 100 ug via INTRAVENOUS
  Filled 2011-10-20: qty 2

## 2011-10-20 MED ORDER — KETOROLAC TROMETHAMINE 60 MG/2ML IM SOLN
60.0000 mg | Freq: Once | INTRAMUSCULAR | Status: AC | PRN
  Administered 2011-10-20: 60 mg via INTRAMUSCULAR

## 2011-10-20 MED ORDER — SODIUM CHLORIDE 0.9 % IJ SOLN: 3.0000 mL | INTRAMUSCULAR | Status: DC | PRN

## 2011-10-20 MED ORDER — IBUPROFEN 600 MG PO TABS
600.0000 mg | ORAL_TABLET | Freq: Four times a day (QID) | ORAL | Status: DC
  Administered 2011-10-21 – 2011-10-23 (×9): 600 mg via ORAL
  Filled 2011-10-20 (×3): qty 1

## 2011-10-20 MED ORDER — DIBUCAINE 1 % RE OINT: 1.0000 "application " | TOPICAL_OINTMENT | RECTAL | Status: DC | PRN

## 2011-10-20 MED ORDER — CITRIC ACID-SODIUM CITRATE 334-500 MG/5ML PO SOLN
30.0000 mL | Freq: Once | ORAL | Status: AC
  Administered 2011-10-20: 30 mL via ORAL
  Filled 2011-10-20: qty 15

## 2011-10-20 MED ORDER — MENTHOL 3 MG MT LOZG: 1.0000 | LOZENGE | OROMUCOSAL | Status: DC | PRN

## 2011-10-20 MED ORDER — ONDANSETRON HCL 4 MG PO TABS: 4.0000 mg | ORAL_TABLET | ORAL | Status: DC | PRN

## 2011-10-20 SURGICAL SUPPLY — 31 items
BARRIER ADHS 3X4 INTERCEED (GAUZE/BANDAGES/DRESSINGS) IMPLANT
CHLORAPREP W/TINT 26ML (MISCELLANEOUS) ×3 IMPLANT
CLIP FILSHIE TUBAL LIGA STRL (Clip) ×3 IMPLANT
CLOTH BEACON ORANGE TIMEOUT ST (SAFETY) ×3 IMPLANT
CONTAINER PREFILL 10% NBF 15ML (MISCELLANEOUS) IMPLANT
DRESSING TELFA 8X3 (GAUZE/BANDAGES/DRESSINGS) ×3 IMPLANT
ELECT REM PT RETURN 9FT ADLT (ELECTROSURGICAL) ×3
ELECTRODE REM PT RTRN 9FT ADLT (ELECTROSURGICAL) ×2 IMPLANT
EXTRACTOR VACUUM M CUP 4 TUBE (SUCTIONS) ×3 IMPLANT
GLOVE BIO SURGEON STRL SZ 6.5 (GLOVE) ×6 IMPLANT
GOWN PREVENTION PLUS LG XLONG (DISPOSABLE) ×9 IMPLANT
KIT ABG SYR 3ML LUER SLIP (SYRINGE) IMPLANT
NEEDLE HYPO 22GX1.5 SAFETY (NEEDLE) ×3 IMPLANT
NEEDLE HYPO 25X5/8 SAFETYGLIDE (NEEDLE) ×3 IMPLANT
NS IRRIG 1000ML POUR BTL (IV SOLUTION) ×6 IMPLANT
PACK C SECTION WH (CUSTOM PROCEDURE TRAY) ×3 IMPLANT
PAD ABD 7.5X8 STRL (GAUZE/BANDAGES/DRESSINGS) ×3 IMPLANT
SLEEVE SCD COMPRESS KNEE MED (MISCELLANEOUS) ×3 IMPLANT
SPONGE GAUZE 4X4 12PLY (GAUZE/BANDAGES/DRESSINGS) ×3 IMPLANT
STAPLER VISISTAT 35W (STAPLE) ×3 IMPLANT
SUT CHROMIC 0 CTX 36 (SUTURE) ×6 IMPLANT
SUT PLAIN 0 NONE (SUTURE) ×3 IMPLANT
SUT PLAIN 2 0 XLH (SUTURE) IMPLANT
SUT VIC AB 0 CT1 27 (SUTURE) ×3
SUT VIC AB 0 CT1 27XBRD ANBCTR (SUTURE) ×6 IMPLANT
SUT VIC AB 4-0 KS 27 (SUTURE) IMPLANT
SYR CONTROL 10ML LL (SYRINGE) ×3 IMPLANT
TAPE CLOTH SURG 4X10 WHT LF (GAUZE/BANDAGES/DRESSINGS) ×3 IMPLANT
TOWEL OR 17X24 6PK STRL BLUE (TOWEL DISPOSABLE) ×6 IMPLANT
TRAY FOLEY CATH 14FR (SET/KITS/TRAYS/PACK) ×3 IMPLANT
WATER STERILE IRR 1000ML POUR (IV SOLUTION) IMPLANT

## 2011-10-20 NOTE — Transfer of Care (Signed)
Immediate Anesthesia Transfer of Care Note  Patient: Jennifer Haas  Procedure(s) Performed: Procedure(s) (LRB): CERCLAGE ABDOMINAL (N/A) CESAREAN SECTION WITH BILATERAL TUBAL LIGATION (N/A)  Patient Location: PACU  Anesthesia Type: Spinal  Level of Consciousness: awake, alert  and oriented  Airway & Oxygen Therapy: Patient Spontanous Breathing  Post-op Assessment: Report given to PACU RN and Post -op Vital signs reviewed and stable  Post vital signs: Reviewed and stable  Complications: No apparent anesthesia complications

## 2011-10-20 NOTE — Brief Op Note (Signed)
10/20/2011  2:54 PM  PATIENT:  Jennifer Haas  36 y.o. female  PRE-OPERATIVE DIAGNOSIS: IUP at 35 1/7, Labor, Abdominal Cerclage, Desires permanent sterilization  POST-OPERATIVE DIAGNOSIS:  Same  PROCEDURE:  Procedure(s) (LRB): CERCLAGE ABDOMINAL (N/A) Primary Low Transverse CESAREAN SECTION WITH BILATERAL TUBAL LIGATION (N/A)  SURGEON:  Surgeon(s) and Role:    * Jeani Hawking, MD - Primary  PHYSICIAN ASSISTANT:   ASSISTANTS: none   ANESTHESIA:   spinal  EBL:  Total I/O In: 2300 [I.V.:2300] Out: 775 [Urine:75; Blood:700]  BLOOD ADMINISTERED:none  DRAINS: Urinary Catheter (Foley)   LOCAL MEDICATIONS USED:  NONE  SPECIMEN:  No Specimen  DISPOSITION OF SPECIMEN:  N/A  COUNTS:  YES  TOURNIQUET:  * No tourniquets in log *  DICTATION: .Other Dictation: Dictation Number [id number]  PLAN OF CARE: Admit to inpatient   PATIENT DISPOSITION:  PACU - hemodynamically stable.   Delay start of Pharmacological VTE agent (>24hrs) due to surgical blood loss or risk of bleeding: not applicable

## 2011-10-20 NOTE — Progress Notes (Signed)
Called Dr. Mal Amabile for patient's pain 8/10. Telephone orders given and read back.

## 2011-10-20 NOTE — MAU Note (Signed)
Went to office with contractions, cervix thin, has abdominal cerclage. Sent to MAU to prepare for C/S.

## 2011-10-20 NOTE — Anesthesia Postprocedure Evaluation (Signed)
Anesthesia Post Note  Patient: Jennifer Haas  Procedure(s) Performed: Procedure(s) (LRB): CERCLAGE ABDOMINAL (N/A) CESAREAN SECTION WITH BILATERAL TUBAL LIGATION (N/A)  Anesthesia type: Spinal  Patient location: PACU  Post pain: Pain level controlled  Post assessment: Post-op Vital signs reviewed  Last Vitals:  Filed Vitals:   10/20/11 1700  BP: 123/76  Pulse: 75  Temp: 36.8 C  Resp: 20    Post vital signs: Reviewed  Level of consciousness: awake  Complications: No apparent anesthesia complications

## 2011-10-20 NOTE — H&P (Signed)
36 year old G 7 P 1413 at 35 weeks and 1 day presents from the office in labor. Reports strong, regular uterine contractions. She has an abdominal cerclage placed because of poor obstetrical history. For C Section and BTL -she wants permanent sterilization. On Procardia every 6 hours.  PNC see [facility] History of GDM GBBS is negative History of PVC'S Wants permanent sterilization and signed Medicaid papers History of 4 22 week deliveries after PPROM  Afebrile Vital signs stable General alert and oriented Fetal heart rate is reactive Toco positive for uterine contractions Lung CTAB Car RRR Abdomen is soft and non tender and Gravid Cervix is 100%/1 cm stitch palpable vertex  IMPRESSION: IUP at 35 w 1 days PTL Abdominal cerclage  PLAN: Primary LTCS BTL Removal of abdominal cerclage Patient counselled about risks which include infection, bleeding, injury to internal organs and venous thromboembolism Consent is signed.

## 2011-10-20 NOTE — Anesthesia Procedure Notes (Signed)
Spinal  Patient location during procedure: OR Start time: 10/20/2011 1:47 PM Staffing Performed by: anesthesiologist  Preanesthetic Checklist Completed: patient identified, site marked, surgical consent, pre-op evaluation, timeout performed, IV checked, risks and benefits discussed and monitors and equipment checked Spinal Block Patient position: sitting Prep: site prepped and draped and DuraPrep Patient monitoring: heart rate, cardiac monitor, continuous pulse ox and blood pressure Approach: midline Location: L3-4 Injection technique: single-shot Needle Needle type: Sprotte  Needle gauge: 24 G Needle length: 9 cm Assessment Sensory level: T4 Additional Notes Clear free flow CSF on first attempt.  No paresthesia.  Patient tolerated procedure well.  Jasmine December, MD

## 2011-10-20 NOTE — Anesthesia Preprocedure Evaluation (Addendum)
Anesthesia Evaluation  Patient identified by MRN, date of birth, ID band Patient awake    Reviewed: Allergy & Precautions, H&P , NPO status , Patient's Chart, lab work & pertinent test results, reviewed documented beta blocker date and time   History of Anesthesia Complications Negative for: history of anesthetic complications  Airway Mallampati: III TM Distance: >3 FB Neck ROM: full    Dental  (+) Teeth Intact   Pulmonary neg pulmonary ROS,  breath sounds clear to auscultation        Cardiovascular + dysrhythmias (PVCs) Rhythm:regular Rate:Normal     Neuro/Psych negative neurological ROS  negative psych ROS   GI/Hepatic negative GI ROS, Neg liver ROS,   Endo/Other  Diabetes mellitus-, Well Controlled, Gestational  Renal/GU negative Renal ROS  negative genitourinary   Musculoskeletal negative musculoskeletal ROS (+)   Abdominal   Peds  Hematology negative hematology ROS (+)   Anesthesia Other Findings   Reproductive/Obstetrics (+) Pregnancy (has abdominal cerclage in place, labor at 35 weeks)                          Anesthesia Physical Anesthesia Plan  ASA: II and Emergent  Anesthesia Plan: Spinal   Post-op Pain Management:    Induction:   Airway Management Planned:   Additional Equipment:   Intra-op Plan:   Post-operative Plan:   Informed Consent: I have reviewed the patients History and Physical, chart, labs and discussed the procedure including the risks, benefits and alternatives for the proposed anesthesia with the patient or authorized representative who has indicated his/her understanding and acceptance.     Plan Discussed with: Surgeon and CRNA  Anesthesia Plan Comments:         Anesthesia Quick Evaluation

## 2011-10-21 ENCOUNTER — Encounter (HOSPITAL_COMMUNITY): Payer: Self-pay | Admitting: Anesthesiology

## 2011-10-21 ENCOUNTER — Encounter (HOSPITAL_COMMUNITY): Payer: Self-pay | Admitting: Obstetrics and Gynecology

## 2011-10-21 LAB — RPR: RPR Ser Ql: NONREACTIVE

## 2011-10-21 LAB — CBC
HCT: 29.4 % — ABNORMAL LOW (ref 36.0–46.0)
Hemoglobin: 9.8 g/dL — ABNORMAL LOW (ref 12.0–15.0)
MCH: 27.2 pg (ref 26.0–34.0)
MCHC: 33.3 g/dL (ref 30.0–36.0)
MCV: 81.7 fL (ref 78.0–100.0)
RBC: 3.6 MIL/uL — ABNORMAL LOW (ref 3.87–5.11)

## 2011-10-21 NOTE — Progress Notes (Signed)
Subjective: Postpartum Day 1: Cesarean Delivery Patient reports tolerating PO and no problems voiding.    Objective: Vital signs in last 24 hours: Temp:  [97.9 F (36.6 C)-98.9 F (37.2 C)] 98.9 F (37.2 C) (06/18 0600) Pulse Rate:  [55-100] 79  (06/18 0600) Resp:  [16-23] 20  (06/18 0600) BP: (109-148)/(59-87) 112/75 mmHg (06/18 0600) SpO2:  [98 %-100 %] 98 % (06/18 0600) Weight:  [85.548 kg (188 lb 9.6 oz)] 85.548 kg (188 lb 9.6 oz) (06/17 1221)  Physical Exam:  General: alert and cooperative Lochia: appropriate Uterine Fundus: firm Incision: abd dressing CDI DVT Evaluation: No evidence of DVT seen on physical exam. Abd soft , decreased BS  Basename 10/21/11 0511 10/20/11 1315  HGB 9.8* 12.0  HCT 29.4* 35.5*    Assessment/Plan: Status post Cesarean section. Doing well postoperatively.  Continue current care Encouraged warm beverages.  CURTIS,CAROL G 10/21/2011, 7:51 AM

## 2011-10-21 NOTE — Progress Notes (Signed)
UR chart review completed.  

## 2011-10-21 NOTE — Anesthesia Postprocedure Evaluation (Signed)
  Anesthesia Post-op Note  Patient: Jennifer Haas  Procedure(s) Performed: Procedure(s) (LRB): CERCLAGE ABDOMINAL (N/A) CESAREAN SECTION WITH BILATERAL TUBAL LIGATION (N/A)  Patient Location: Mother/Baby  Anesthesia Type: Spinal  Level of Consciousness: awake, alert  and oriented  Airway and Oxygen Therapy: Patient Spontanous Breathing  Post-op Pain: none  Post-op Assessment: Post-op Vital signs reviewed and Patient's Cardiovascular Status Stable  Post-op Vital Signs: Reviewed and stable  Complications: No apparent anesthesia complications

## 2011-10-21 NOTE — Op Note (Signed)
Jennifer Haas, Jennifer Haas             ACCOUNT NO.:  [id number]  MEDICAL RECORD NO.:  [id number]  LOCATION:  9102                          FACILITY:  [facility]  PHYSICIAN:  Ashlin Kreps L. Peirce Deveney, M.D.DATE OF BIRTH:  [birth year]  DATE OF PROCEDURE:  10/20/2011 DATE OF DISCHARGE:                              OPERATIVE REPORT   PREOPERATIVE DIAGNOSES: 1. Intrauterine pregnancy at 35 and 1. 2. Labor. 3. Abdominal cerclage. 4. Desires permanent sterilization.  POSTOPERATIVE DIAGNOSES: 1. Intrauterine pregnancy at 35 and 1. 2. Labor. 3. Abdominal cerclage. 4. Desires permanent sterilization.  OPERATION PROCEDURE: 1. Primary low transverse cesarean section. 2. Removal of abdominal cerclage. 3. Bilateral tubal ligation using Filshie clips.  SURGEON:  Devantae Babe L. Vincente Poli, M.D.  ANESTHESIA:  Spinal.  ESTIMATED BLOOD LOSS:  700 cc.  COMPLICATIONS:  None.  PROCEDURE:  The patient was taken to the operating room.  Her spinal was placed without difficulty by Dr. Rodman Pickle.  She was then prepped and draped and a Foley catheter was inserted and draining clear urine.  A low transverse incision was made at the area of the abdominal cerclage skin scar, was carried down to the fascia.  Fascia was scored in the midline and extended laterally.  The rectus muscles were separated in the midline.  The peritoneum was entered bluntly.  The peritoneal incision was then extended.  A bladder blade was inserted.  The lower uterine segment was identified and the bladder flap was created sharply and then digitally.  The bladder blade was readjusted.  A low transverse incision was made in the uterus.  Uterus was entered using a hemostat. The amniotic fluid was clear.  The baby was in cephalic presentation, was delivered easily with a vacuum extractor.  The cord was clamped and cut.  The baby was handed to the awaiting pediatrician, and the placenta was manually removed, noted to be normal, intact with a  three-vessel cord.  Antibiotics and Pitocin were given.  The uterus was cleared of all clots and debris.  The uterus was exteriorized.  It was noted to have some fibroids.  The uterine incision was closed in 2 layers using 0 chromic in a running locked stitch.  At this point, we then identified the abdominal cerclage.  There were some filmy adhesions, so we had to carefully take that down with Metzenbaum.  I was unable to clip the knot and then pull through all the suture with excellent hemostasis.  At this point, the third thing that we did was tubal ligation.  The right fallopian tube was easily seen.  We grasped the midportion and we placed a Filshie clip across the tube.  In the left one, there were lots of adhesions involving the colon to the tubes.  We had to take this down carefully with Metzenbaum scissors.  We were then able to easily visualize the fimbriated end, and after the lysis of adhesions, I was then able to identify the midportion of the tube and place a Filshie clip firmly across with tube.  After that, the uterus was returned to the abdomen.  Irrigation was performed.  Hemostasis was noted.  The peritoneum was closed using 0 Vicryl.  The rectus muscles were  reapproximated using 0 Vicryl.  The fascia was closed using 0 Vicryl running stitch starting at each corner and meeting in the midline. After irrigation of the subcutaneous layer, the skin was closed with staples.  A pressure dressing was applied.  All sponge, lap, and instrument counts were correct x2.  The patient went to recovery room in stable condition.     [name], M.D.     [name]  D:  10/20/2011  T:  10/21/2011  Job:  [id number]

## 2011-10-22 MED ORDER — MAGNESIUM HYDROXIDE 400 MG/5ML PO SUSP
30.0000 mL | Freq: Every day | ORAL | Status: DC | PRN
  Administered 2011-10-22: 30 mL via ORAL
  Filled 2011-10-22: qty 30

## 2011-10-22 MED ORDER — BISACODYL 10 MG RE SUPP
10.0000 mg | Freq: Every day | RECTAL | Status: DC | PRN
  Administered 2011-10-22: 10 mg via RECTAL
  Filled 2011-10-22: qty 1

## 2011-10-22 NOTE — Progress Notes (Signed)
Subjective: Postpartum Day 2: Cesarean Delivery Patient reports tolerating PO. + flatus, baby continues in the NICU   Objective: Vital signs in last 24 hours: Temp:  [98 F (36.7 C)-98.9 F (37.2 C)] 98 F (36.7 C) (06/19 0510) Pulse Rate:  [71-83] 83  (06/19 0510) Resp:  [18-20] 18  (06/19 0510) BP: (98-119)/(58-76) 110/58 mmHg (06/19 0510) SpO2:  [98 %] 98 % (06/19 0207)  Physical Exam:  General: alert and cooperative Lochia: appropriate Uterine Fundus: firm Incision: healing well, staples intact DVT Evaluation: No evidence of DVT seen on physical exam. Hypoactive BS  Basename 10/21/11 0511 10/20/11 1315  HGB 9.8* 12.0  HCT 29.4* 35.5*    Assessment/Plan: Status post Cesarean section. Doing well postoperatively.  Continue current care. Encouraged warm beverages and increase in ambulation  Skye Rodarte G 10/22/2011, 7:56 AM

## 2011-10-23 MED ORDER — IBUPROFEN 600 MG PO TABS: 600.0000 mg | ORAL_TABLET | Freq: Four times a day (QID) | ORAL | Status: AC

## 2011-10-23 MED ORDER — OXYCODONE-ACETAMINOPHEN 5-325 MG PO TABS: 1.0000 | ORAL_TABLET | ORAL | Status: AC | PRN

## 2011-10-23 NOTE — Clinical Social Work Maternal (Signed)
    Clinical Social Work Department PSYCHOSOCIAL ASSESSMENT - MATERNAL/CHILD Late Entry: 10/21/2011  Patient:  Jennifer Haas, Jennifer Haas  Account Number:  [id number]  Admit Date:  10/20/2011  Jennifer Haas    Clinical Social Worker:  Lulu Riding, LCSW   Date/Time:  10/21/2011 10:00 AM  Date Referred:  10/21/2011   Referral source  NICU     Referred reason  NICU   Other referral source:    I:  FAMILY / HOME ENVIRONMENT Child's legal guardian:  PARENT  Guardian - Name Guardian - Age Guardian - Address  Jennifer Haas 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 and plans to stay home with the baby for at least a few months  FOB works at a Materials engineer resources:  [employer] If [employer]:  [employer] / Grade:   Maternity Care Coordinator / Child Services Coordination / Early Interventions:  Cultural issues impacting care:   none known    III  STRENGTHS Strengths  Adequate Resources  Compliance with medical plan  Home prepared for Child (including basic supplies)  Supportive family/friends  Understanding of illness   Strength comment:    IV  RISK FACTORS AND CURRENT PROBLEMS Current Problem:  None   Risk Factor & Current Problem Patient Issue Family Issue Risk Factor / Current Problem Comment   N N     V  SOCIAL WORK ASSESSMENT SW met with MOB in her first floor room/102 to introduce myself, complete assessment and evaluate how family is coping with baby's admission to NICU.  MOB was quiet, but very pleasant and states she and baby are doing well today. She states  that she has had the NICU experience when her 45 year old son was born at 25 weeks.  She also has a history of loss.  SW discussed common emotions related to NICU admission and positive coping strategies.  She states no questions or concerns at this time other than if 35 weeks is considered "preterm."  SW states that 35 weeks is considered preterm, but not an automatic reason for admission to NICU.  SW advised she speak to a neonatalogist for further information and offered to arrange.  She states she understands and has no further questions.  She reports having everything she needs for baby at home and a good support system.  SW explained support services offered by NICU SW and gave contact information.  SW does not have any social concerns at this time.      VI SOCIAL WORK PLAN Social Work Plan  Psychosocial Support/Ongoing Assessment of Needs   Type of pt/family education:   If child protective services report - county:   If child protective services report - date:   Information/referral to community resources comment:   None identified at this time.   Other social work plan:

## 2011-10-23 NOTE — Discharge Summary (Signed)
Obstetric Discharge Summary Reason for Admission: onset of labor Prenatal Procedures: ultrasound Intrapartum Procedures: cesarean: low cervical, transverse and tubal ligation Postpartum Procedures: none Complications-Operative and Postpartum: none Hemoglobin  Date Value Range Status  10/21/2011 9.8* 12.0 - 15.0 g/dL Final     DELTA CHECK NOTED     REPEATED TO VERIFY     HCT  Date Value Range Status  10/21/2011 29.4* 36.0 - 46.0 % Final    Physical Exam:  General: alert and cooperative Lochia: appropriate Uterine Fundus: firm Incision: healing well, staples intact DVT Evaluation: No evidence of DVT seen on physical exam.  Discharge Diagnoses: s/p cesarean delivery at 35 weeks . BTL and removal of abd cerclage  Discharge Information: Date: 10/23/2011 Activity: pelvic rest Diet: routine Medications: PNV, Ibuprofen and Percocet Condition: stable Instructions: refer to practice specific booklet Discharge to: home   Newborn Data: Live born female  Birth Weight: 4 lb 9.4 oz (2080 g) APGAR: 8, 9  Home with nicu.  Jennifer Haas G 10/23/2011, 8:02 AM

## 2011-10-23 NOTE — Progress Notes (Signed)
Subjective: Postpartum Day 3: Cesarean Delivery Patient reports tolerating PO, + flatus and no problems voiding.  Baby continues in NICU under phototherapy Objective: Vital signs in last 24 hours: Temp:  [98.1 F (36.7 C)-98.2 F (36.8 C)] 98.1 F (36.7 C) (06/20 0521) Pulse Rate:  [79-91] 79  (06/20 0521) Resp:  [18] 18  (06/20 0521) BP: (110-124)/(71-78) 110/71 mmHg (06/20 0521)  Physical Exam:  General: alert and cooperative Lochia: appropriate Uterine Fundus: firm Incision: healing well, staples intact DVT Evaluation: No evidence of DVT seen on physical exam.   Basename 10/21/11 0511 10/20/11 1315  HGB 9.8* 12.0  HCT 29.4* 35.5*    Assessment/Plan: Status post Cesarean section. Doing well postoperatively.  Discharge home with standard precautions and return to clinic in 1 week Leave staples in for 1 week per Dr.MCComb.  Felipa Laroche G 10/23/2011, 7:54 AM

## 2011-11-17 ENCOUNTER — Inpatient Hospital Stay (HOSPITAL_COMMUNITY)
Admission: RE | Admit: 2011-11-17 | Payer: Medicaid Other | Source: Ambulatory Visit | Admitting: Obstetrics and Gynecology

## 2011-11-17 ENCOUNTER — Encounter (HOSPITAL_COMMUNITY): Admission: RE | Payer: Self-pay | Source: Ambulatory Visit

## 2011-11-17 SURGERY — Surgical Case
Anesthesia: Regional

## 2012-07-08 ENCOUNTER — Encounter (HOSPITAL_COMMUNITY): Payer: Self-pay | Admitting: Emergency Medicine

## 2012-07-08 ENCOUNTER — Emergency Department (HOSPITAL_COMMUNITY)
Admission: EM | Admit: 2012-07-08 | Discharge: 2012-07-08 | Disposition: A | Discharge status: Home / Self Care | Payer: Self-pay | Attending: Emergency Medicine | Admitting: Emergency Medicine

## 2012-07-08 ENCOUNTER — Emergency Department (HOSPITAL_COMMUNITY): Payer: Self-pay

## 2012-07-08 DIAGNOSIS — Z8742 Personal history of other diseases of the female genital tract: Secondary | ICD-10-CM | POA: Insufficient documentation

## 2012-07-08 DIAGNOSIS — D259 Leiomyoma of uterus, unspecified: Secondary | ICD-10-CM | POA: Insufficient documentation

## 2012-07-08 DIAGNOSIS — R0602 Shortness of breath: Secondary | ICD-10-CM | POA: Insufficient documentation

## 2012-07-08 DIAGNOSIS — R509 Fever, unspecified: Secondary | ICD-10-CM | POA: Insufficient documentation

## 2012-07-08 DIAGNOSIS — R109 Unspecified abdominal pain: Secondary | ICD-10-CM

## 2012-07-08 DIAGNOSIS — Z3202 Encounter for pregnancy test, result negative: Secondary | ICD-10-CM | POA: Insufficient documentation

## 2012-07-08 DIAGNOSIS — Z8679 Personal history of other diseases of the circulatory system: Secondary | ICD-10-CM | POA: Insufficient documentation

## 2012-07-08 DIAGNOSIS — Z9889 Other specified postprocedural states: Secondary | ICD-10-CM | POA: Insufficient documentation

## 2012-07-08 DIAGNOSIS — N83209 Unspecified ovarian cyst, unspecified side: Secondary | ICD-10-CM | POA: Insufficient documentation

## 2012-07-08 DIAGNOSIS — R55 Syncope and collapse: Secondary | ICD-10-CM | POA: Insufficient documentation

## 2012-07-08 DIAGNOSIS — Z8632 Personal history of gestational diabetes: Secondary | ICD-10-CM | POA: Insufficient documentation

## 2012-07-08 DIAGNOSIS — D219 Benign neoplasm of connective and other soft tissue, unspecified: Secondary | ICD-10-CM

## 2012-07-08 LAB — COMPREHENSIVE METABOLIC PANEL
AST: 13 U/L (ref 0–37)
Albumin: 3.4 g/dL — ABNORMAL LOW (ref 3.5–5.2)
Alkaline Phosphatase: 87 U/L (ref 39–117)
BUN: 13 mg/dL (ref 6–23)
CO2: 23 mEq/L (ref 19–32)
Chloride: 101 mEq/L (ref 96–112)
Potassium: 3.5 mEq/L (ref 3.5–5.1)
Total Bilirubin: 0.2 mg/dL — ABNORMAL LOW (ref 0.3–1.2)

## 2012-07-08 LAB — URINALYSIS, MICROSCOPIC ONLY
Bilirubin Urine: NEGATIVE
Glucose, UA: NEGATIVE mg/dL
Ketones, ur: NEGATIVE mg/dL
pH: 6 (ref 5.0–8.0)

## 2012-07-08 LAB — CBC WITH DIFFERENTIAL/PLATELET
Basophils Relative: 0 % (ref 0–1)
Hemoglobin: 13.2 g/dL (ref 12.0–15.0)
Lymphocytes Relative: 17 % (ref 12–46)
MCHC: 34.5 g/dL (ref 30.0–36.0)
Monocytes Relative: 8 % (ref 3–12)
Neutro Abs: 6.2 10*3/uL (ref 1.7–7.7)
Neutrophils Relative %: 75 % (ref 43–77)
RBC: 4.58 MIL/uL (ref 3.87–5.11)
WBC: 8.3 10*3/uL (ref 4.0–10.5)

## 2012-07-08 LAB — POCT PREGNANCY, URINE: Preg Test, Ur: NEGATIVE

## 2012-07-08 LAB — LIPASE, BLOOD: Lipase: 33 U/L (ref 11–59)

## 2012-07-08 MED ORDER — SODIUM CHLORIDE 0.9 % IV SOLN
Freq: Once | INTRAVENOUS | Status: AC
  Administered 2012-07-08: 03:00:00 via INTRAVENOUS

## 2012-07-08 MED ORDER — HYDROCODONE-ACETAMINOPHEN 5-325 MG PO TABS: 2.0000 | ORAL_TABLET | ORAL | Status: DC | PRN

## 2012-07-08 MED ORDER — HYDROMORPHONE HCL PF 1 MG/ML IJ SOLN
1.0000 mg | Freq: Once | INTRAMUSCULAR | Status: AC
  Administered 2012-07-08: 1 mg via INTRAVENOUS
  Filled 2012-07-08: qty 1

## 2012-07-08 MED ORDER — ONDANSETRON HCL 4 MG/2ML IJ SOLN
4.0000 mg | Freq: Once | INTRAMUSCULAR | Status: AC
  Administered 2012-07-08: 4 mg via INTRAVENOUS
  Filled 2012-07-08: qty 2

## 2012-07-08 MED ORDER — HYDROMORPHONE HCL PF 1 MG/ML IJ SOLN
0.5000 mg | Freq: Once | INTRAMUSCULAR | Status: AC
  Administered 2012-07-08: 0.5 mg via INTRAVENOUS
  Filled 2012-07-08: qty 1

## 2012-07-08 MED ORDER — PROMETHAZINE HCL 25 MG PO TABS: 25.0000 mg | ORAL_TABLET | Freq: Four times a day (QID) | ORAL | Status: DC | PRN

## 2012-07-08 NOTE — ED Notes (Signed)
Pt states abdominal pain x1 week, began to have chills tonight.  Pt denies n/v/d.  NAD noted at this time

## 2012-07-08 NOTE — ED Notes (Signed)
Per EMS, pt c/o bilateral lower abdominal pain x1 week, denies n/v/d.  Pt states " feels like gas cramping".  Pt seemed to have a syncopal episode when EMS arrived, CBG 114. Pt alert and oriented at this time.

## 2012-07-08 NOTE — ED Notes (Signed)
[id number]<AV> Expected date:<BR> Expected time:<BR> Means of arrival:<BR> Comments:<BR> EMS/37 yo female with lower abd pain

## 2012-07-08 NOTE — ED Provider Notes (Signed)
Medical screening examination/treatment/procedure(s) were performed by non-physician practitioner and as supervising physician I was immediately available for consultation/collaboration.  Olivia Mackie, MD 07/08/12 [phone]

## 2012-07-08 NOTE — ED Notes (Signed)
Pt able to ambulate to BR without assistance  

## 2012-07-08 NOTE — ED Notes (Signed)
PA at bedside speaking to pt at this time

## 2012-07-08 NOTE — ED Provider Notes (Signed)
History     CSN: [id number]  Arrival date & time 07/08/12  0153   First MD Initiated Contact with Patient 07/08/12 0239      Chief Complaint  Patient presents with  . Abdominal Pain    (Consider location/radiation/quality/duration/timing/severity/associated sxs/prior treatment) HPI Comments: This is a 37 yo woman with severe abdominal pain since 930pm. This pain has been happening intermittently for the past week with a lesser severity. She had a syncopal episode at home before she came in. She describes the pain as cramping and does not radiate. For the past week she has had mild abdominal pain in her lower abdomen. Recent surgical history includes a c-section in June of last year. LMP was 3 weeks ago. 1 episode of SOB that spontaneously resolved. No urinary symptoms including frequency, urgency, or dysuria. Last BM was yesterday. It was formed, no diarrhea. No nausea or vomiting. No anorexia.   Patient is a 37 y.o. female presenting with abdominal pain. The history is provided by the patient. No language interpreter was used.  Abdominal Pain Pain location:  RLQ Pain quality: cramping   Pain radiates to:  LLQ Pain severity:  Severe Onset quality:  Sudden Duration:  6 hours Timing:  Constant Progression:  Worsening Chronicity:  New Context: not sick contacts, not suspicious food intake and not trauma   Relieved by:  Nothing Associated symptoms: chills, fever and shortness of breath   Associated symptoms: no chest pain and no dysuria     Past Medical History  Diagnosis Date  . Incompetence of cervix   . Prior pregnancy with fetal demise   . PVC's (premature ventricular contractions)   . Gestational diabetes     With G4 diet controlled    Past Surgical History  Procedure Laterality Date  . Leep    . Cervical cerclage    . Abdominal cerclage  05/29/2011    Procedure: CERCLAGE ABDOMINAL;  Surgeon: Juluis Mire, MD;  Location: [facility];  Service: Gynecology;  Laterality: N/A;   need to use mersilene band  . Abdominal cerclage  10/20/2011    Procedure: CERCLAGE ABDOMINAL;  Surgeon: Jeani Hawking, MD;  Location: [facility];  Service: Gynecology;  Laterality: N/A;  Removal of abdominal cerclage    Family History  Problem Relation Age of Onset  . Diabetes Mother   . Diabetes Father   . Heart disease Father   . Hypertension Father     History  Substance Use Topics  . Smoking status: Never Smoker   . Smokeless tobacco: Never Used  . Alcohol Use: No    OB History   Grav Para Term Preterm Abortions TAB SAB Ect Mult Living   7 7 1 6  0 0 0 0 0 4      Review of Systems  Constitutional: Positive for fever and chills.  Respiratory: Positive for shortness of breath.   Cardiovascular: Negative for chest pain.  Gastrointestinal: Positive for abdominal pain.  Genitourinary: Negative for dysuria, urgency, frequency and difficulty urinating.  All other systems reviewed and are negative.    Allergies  Review of patient's allergies indicates no known allergies.  Home Medications  No current outpatient prescriptions on file.  BP 136/80  Pulse 96  Temp(Src) 100.4 F (38 C) (Oral)  SpO2 96%  LMP 06/16/2012  Physical Exam  Nursing note and vitals reviewed. Constitutional: She is oriented to person, place, and time. She appears well-developed. She appears distressed.  HENT:  Head: Normocephalic and atraumatic.  Right  Ear: External ear normal.  Left Ear: External ear normal.  Nose: Nose normal.  Eyes: EOM are normal. Pupils are equal, round, and reactive to light.  Neck: Normal range of motion. No tracheal deviation present.  Cardiovascular: Normal rate, regular rhythm, normal heart sounds and intact distal pulses.  Exam reveals no gallop and no friction rub.   No murmur heard. Pulmonary/Chest: Effort normal and breath sounds normal. No stridor. No respiratory distress. She has no wheezes. She has no rales.  Abdominal: Soft. Normal appearance and bowel  sounds are normal. She exhibits no distension. There is tenderness in the right lower quadrant and left lower quadrant. There is no rebound.  Musculoskeletal: Normal range of motion.  Neurological: She is alert and oriented to person, place, and time.  Skin: Skin is warm and dry. She is not diaphoretic.  Psychiatric: She has a normal mood and affect. Her behavior is normal.    ED Course  Procedures (including critical care time)  Labs Reviewed  COMPREHENSIVE METABOLIC PANEL - Abnormal; Notable for the following:    Glucose, Bld 125 (*)    Albumin 3.4 (*)    Total Bilirubin 0.2 (*)    All other components within normal limits  URINALYSIS, MICROSCOPIC ONLY - Abnormal; Notable for the following:    Hgb urine dipstick MODERATE (*)    All other components within normal limits  CBC WITH DIFFERENTIAL  LIPASE, BLOOD  POCT PREGNANCY, URINE   US Transvaginal Non-ob  07/08/2012  *RADIOLOGY REPORT*  Clinical Data:  Right pelvic pain for 1 week.  TRANSABDOMINAL AND TRANSVAGINAL ULTRASOUND OF PELVIS DOPPLER ULTRASOUND OF OVARIES  Technique:  Both transabdominal and transvaginal ultrasound examinations of the pelvis were performed. Transabdominal technique was performed for global imaging of the pelvis including uterus, ovaries, adnexal regions, and pelvic cul-de-sac.  It was necessary to proceed with endovaginal exam following the transabdominal exam to visualize the uterus and ovaries in greater detail.  Color and duplex Doppler ultrasound was utilized to evaluate blood flow to the ovaries.  Comparison:  Pelvic ultrasound performed 08/10/2011  Findings:  Uterus:  Enlarged, measuring 10.1 x 5.5 x 7.0 cm.  A 3.7 x 3.3 x 3.2 cm intramural fibroid is noted on the left side of the uterus.  Endometrium:  Normal in thickness and appearance; measures 0.4 cm in thickness.  Right ovary: Measures 3.7 x 2.3 x 2.6 cm.  A 1.9 cm cyst containing debris is noted at the right ovary, likely physiologic in nature.  Left  ovary:   Measures 4.1 x 4.1 x 3.7 cm.  A 3.1 x 3.0 x 2.5 cm heterogeneous mass is noted arising at the left ovary; this may either reflect clot within a hemorrhagic cyst or possibly a solid mass.  Color Doppler evaluation demonstrates no evidence of blood flow within this lesion.  Pulsed Doppler evaluation demonstrates normal low-resistance arterial and venous waveforms in both ovaries.  No free fluid is seen in the pelvic cul-de-sac.  IMPRESSION:  1. 3.1 cm heterogeneous mass noted arising at the left ovary; this may reflect either clot within a hemorrhagic cyst or possibly a solid mass.  There is no evidence of blood flow within this lesion, suggesting against malignancy.  MRI would be helpful for further evaluation, when and as deemed clinically appropriate. 2.  3.7 cm fibroid noted within the uterus. 3.  No evidence of ovarian torsion.   Original Report Authenticated By: Tonia Ghent, M.D.    US Pelvis Complete  07/08/2012  *RADIOLOGY REPORT*  Clinical Data:  Right pelvic pain for 1 week.  TRANSABDOMINAL AND TRANSVAGINAL ULTRASOUND OF PELVIS DOPPLER ULTRASOUND OF OVARIES  Technique:  Both transabdominal and transvaginal ultrasound examinations of the pelvis were performed. Transabdominal technique was performed for global imaging of the pelvis including uterus, ovaries, adnexal regions, and pelvic cul-de-sac.  It was necessary to proceed with endovaginal exam following the transabdominal exam to visualize the uterus and ovaries in greater detail.  Color and duplex Doppler ultrasound was utilized to evaluate blood flow to the ovaries.  Comparison:  Pelvic ultrasound performed 08/10/2011  Findings:  Uterus:  Enlarged, measuring 10.1 x 5.5 x 7.0 cm.  A 3.7 x 3.3 x 3.2 cm intramural fibroid is noted on the left side of the uterus.  Endometrium:  Normal in thickness and appearance; measures 0.4 cm in thickness.  Right ovary: Measures 3.7 x 2.3 x 2.6 cm.  A 1.9 cm cyst containing debris is noted at the right ovary,  likely physiologic in nature.  Left ovary:   Measures 4.1 x 4.1 x 3.7 cm.  A 3.1 x 3.0 x 2.5 cm heterogeneous mass is noted arising at the left ovary; this may either reflect clot within a hemorrhagic cyst or possibly a solid mass.  Color Doppler evaluation demonstrates no evidence of blood flow within this lesion.  Pulsed Doppler evaluation demonstrates normal low-resistance arterial and venous waveforms in both ovaries.  No free fluid is seen in the pelvic cul-de-sac.  IMPRESSION:  1. 3.1 cm heterogeneous mass noted arising at the left ovary; this may reflect either clot within a hemorrhagic cyst or possibly a solid mass.  There is no evidence of blood flow within this lesion, suggesting against malignancy.  MRI would be helpful for further evaluation, when and as deemed clinically appropriate. 2.  3.7 cm fibroid noted within the uterus. 3.  No evidence of ovarian torsion.   Original Report Authenticated By: Tonia Ghent, M.D.    [place] Art/ven Flow Abd Pelv Doppler  07/08/2012  *RADIOLOGY REPORT*  Clinical Data:  Right pelvic pain for 1 week.  TRANSABDOMINAL AND TRANSVAGINAL ULTRASOUND OF PELVIS DOPPLER ULTRASOUND OF OVARIES  Technique:  Both transabdominal and transvaginal ultrasound examinations of the pelvis were performed. Transabdominal technique was performed for global imaging of the pelvis including uterus, ovaries, adnexal regions, and pelvic cul-de-sac.  It was necessary to proceed with endovaginal exam following the transabdominal exam to visualize the uterus and ovaries in greater detail.  Color and duplex Doppler ultrasound was utilized to evaluate blood flow to the ovaries.  Comparison:  Pelvic ultrasound performed 08/10/2011  Findings:  Uterus:  Enlarged, measuring 10.1 x 5.5 x 7.0 cm.  A 3.7 x 3.3 x 3.2 cm intramural fibroid is noted on the left side of the uterus.  Endometrium:  Normal in thickness and appearance; measures 0.4 cm in thickness.  Right ovary: Measures 3.7 x 2.3 x 2.6 cm.  A 1.9 cm  cyst containing debris is noted at the right ovary, likely physiologic in nature.  Left ovary:   Measures 4.1 x 4.1 x 3.7 cm.  A 3.1 x 3.0 x 2.5 cm heterogeneous mass is noted arising at the left ovary; this may either reflect clot within a hemorrhagic cyst or possibly a solid mass.  Color Doppler evaluation demonstrates no evidence of blood flow within this lesion.  Pulsed Doppler evaluation demonstrates normal low-resistance arterial and venous waveforms in both ovaries.  No free fluid is seen in the pelvic cul-de-sac.  IMPRESSION:  1. 3.1 cm  heterogeneous mass noted arising at the left ovary; this may reflect either clot within a hemorrhagic cyst or possibly a solid mass.  There is no evidence of blood flow within this lesion, suggesting against malignancy.  MRI would be helpful for further evaluation, when and as deemed clinically appropriate. 2.  3.7 cm fibroid noted within the uterus. 3.  No evidence of ovarian torsion.   Original Report Authenticated By: Tonia Ghent, M.D.      1. Fibroid   2. Ovarian cyst   3. Abdominal pain       MDM  Patient's vitals stable at time of discharge. Pain controlled. Pelvic US showed a left sided fibroid and a left ovarian cyst. Follow up with GYN. Follow up with PCP. Symptoms have been going on for a week and just recently worsened. No torsion. No concern for appendicitis. Return precautions given. Pain is on the right side. Unclear etiology of pain. Patient strongly advised to follow up for further evaluation.         Mora Bellman, PA-C 07/08/12 [phone]

## 2012-07-23 ENCOUNTER — Emergency Department (HOSPITAL_COMMUNITY): Payer: Self-pay

## 2012-07-23 ENCOUNTER — Encounter (HOSPITAL_COMMUNITY): Payer: Self-pay

## 2012-07-23 ENCOUNTER — Emergency Department (HOSPITAL_COMMUNITY)
Admission: EM | Admit: 2012-07-23 | Discharge: 2012-07-23 | Disposition: A | Discharge status: Home / Self Care | Payer: Self-pay | Attending: Emergency Medicine | Admitting: Emergency Medicine

## 2012-07-23 DIAGNOSIS — R112 Nausea with vomiting, unspecified: Secondary | ICD-10-CM | POA: Insufficient documentation

## 2012-07-23 DIAGNOSIS — R350 Frequency of micturition: Secondary | ICD-10-CM | POA: Insufficient documentation

## 2012-07-23 DIAGNOSIS — Z8742 Personal history of other diseases of the female genital tract: Secondary | ICD-10-CM | POA: Insufficient documentation

## 2012-07-23 DIAGNOSIS — Q5039 Other congenital malformation of ovary: Secondary | ICD-10-CM | POA: Insufficient documentation

## 2012-07-23 DIAGNOSIS — R109 Unspecified abdominal pain: Secondary | ICD-10-CM | POA: Insufficient documentation

## 2012-07-23 DIAGNOSIS — R6883 Chills (without fever): Secondary | ICD-10-CM | POA: Insufficient documentation

## 2012-07-23 DIAGNOSIS — Z8632 Personal history of gestational diabetes: Secondary | ICD-10-CM | POA: Insufficient documentation

## 2012-07-23 DIAGNOSIS — Z3202 Encounter for pregnancy test, result negative: Secondary | ICD-10-CM | POA: Insufficient documentation

## 2012-07-23 DIAGNOSIS — Z8679 Personal history of other diseases of the circulatory system: Secondary | ICD-10-CM | POA: Insufficient documentation

## 2012-07-23 LAB — CBC WITH DIFFERENTIAL/PLATELET
Basophils Absolute: 0 10*3/uL (ref 0.0–0.1)
Basophils Relative: 0 % (ref 0–1)
Eosinophils Absolute: 0 10*3/uL (ref 0.0–0.7)
Eosinophils Relative: 0 % (ref 0–5)
HCT: 38.1 % (ref 36.0–46.0)
Hemoglobin: 13.5 g/dL (ref 12.0–15.0)
Lymphocytes Relative: 9 % — ABNORMAL LOW (ref 12–46)
Lymphs Abs: 0.6 10*3/uL — ABNORMAL LOW (ref 0.7–4.0)
MCH: 29.3 pg (ref 26.0–34.0)
MCHC: 35.4 g/dL (ref 30.0–36.0)
MCV: 82.6 fL (ref 78.0–100.0)
Monocytes Absolute: 0.4 10*3/uL (ref 0.1–1.0)
Monocytes Relative: 6 % (ref 3–12)
Neutro Abs: 5.1 10*3/uL (ref 1.7–7.7)
Neutrophils Relative %: 85 % — ABNORMAL HIGH (ref 43–77)
Platelets: 256 10*3/uL (ref 150–400)
RBC: 4.61 MIL/uL (ref 3.87–5.11)
RDW: 13.2 % (ref 11.5–15.5)
WBC: 6 10*3/uL (ref 4.0–10.5)

## 2012-07-23 LAB — URINALYSIS, ROUTINE W REFLEX MICROSCOPIC
Bilirubin Urine: NEGATIVE
Glucose, UA: NEGATIVE mg/dL
Ketones, ur: NEGATIVE mg/dL
Leukocytes, UA: NEGATIVE
Nitrite: NEGATIVE
Protein, ur: NEGATIVE mg/dL
Specific Gravity, Urine: 1.046 — ABNORMAL HIGH (ref 1.005–1.030)
Urobilinogen, UA: 1 mg/dL (ref 0.0–1.0)
pH: 7 (ref 5.0–8.0)

## 2012-07-23 LAB — WET PREP, GENITAL
Clue Cells Wet Prep HPF POC: NONE SEEN
Trich, Wet Prep: NONE SEEN
Yeast Wet Prep HPF POC: NONE SEEN

## 2012-07-23 LAB — BASIC METABOLIC PANEL
BUN: 11 mg/dL (ref 6–23)
CO2: 27 mEq/L (ref 19–32)
Calcium: 8.6 mg/dL (ref 8.4–10.5)
Chloride: 101 mEq/L (ref 96–112)
Creatinine, Ser: 0.6 mg/dL (ref 0.50–1.10)
GFR calc Af Amer: >90 mL/min (ref >90)
GFR calc non Af Amer: >90 mL/min (ref >90)
Glucose, Bld: 104 mg/dL — ABNORMAL HIGH (ref 70–99)
Potassium: 3.2 mEq/L — ABNORMAL LOW (ref 3.5–5.1)
Sodium: 139 mEq/L (ref 135–145)

## 2012-07-23 LAB — URINE MICROSCOPIC-ADD ON

## 2012-07-23 LAB — PREGNANCY, URINE: Preg Test, Ur: NEGATIVE

## 2012-07-23 MED ORDER — KETOROLAC TROMETHAMINE 30 MG/ML IJ SOLN
30.0000 mg | Freq: Once | INTRAMUSCULAR | Status: AC
  Administered 2012-07-23: 30 mg via INTRAVENOUS
  Filled 2012-07-23: qty 1

## 2012-07-23 MED ORDER — HYDROMORPHONE HCL PF 1 MG/ML IJ SOLN
1.0000 mg | Freq: Once | INTRAMUSCULAR | Status: AC
  Administered 2012-07-23: 1 mg via INTRAVENOUS
  Filled 2012-07-23: qty 1

## 2012-07-23 MED ORDER — MORPHINE SULFATE 4 MG/ML IJ SOLN
6.0000 mg | Freq: Once | INTRAMUSCULAR | Status: AC
  Administered 2012-07-23: 6 mg via INTRAVENOUS
  Filled 2012-07-23: qty 2

## 2012-07-23 MED ORDER — IOHEXOL 300 MG/ML  SOLN
100.0000 mL | Freq: Once | INTRAMUSCULAR | Status: AC | PRN
  Administered 2012-07-23: 100 mL via INTRAVENOUS

## 2012-07-23 MED ORDER — OXYCODONE-ACETAMINOPHEN 5-325 MG PO TABS: 1.0000 | ORAL_TABLET | Freq: Four times a day (QID) | ORAL | Status: DC | PRN

## 2012-07-23 MED ORDER — ONDANSETRON HCL 4 MG/2ML IJ SOLN
4.0000 mg | Freq: Once | INTRAMUSCULAR | Status: AC
  Administered 2012-07-23: 4 mg via INTRAVENOUS
  Filled 2012-07-23: qty 2

## 2012-07-23 MED ORDER — IOHEXOL 300 MG/ML  SOLN
25.0000 mL | INTRAMUSCULAR | Status: AC
  Administered 2012-07-23: 25 mL via ORAL

## 2012-07-23 NOTE — ED Notes (Addendum)
Abdominal pain began on March 5th.  Went to [facility] and was diagnosed with fibroids on the ovaries.   Has not followed up with her Gynecologist.  Pt. Has chills did not check her temperature and reports been vomiting all night .  The pain to her lower abdomen is severe pain, radiates into both legs. Pt. Is afebrile.   Pt. Was given at [facility] Vicodin and Phenergan.. She is out of the Vicodin and is not currently treating her pain.  Pt. Reports that the Phenergan does not help with the n/v, Skin is w/d no active vomiting presently.  Pt. Denies any vaginal bleeding or discharge.

## 2012-07-23 NOTE — ED Notes (Signed)
Called CT notified patient finished with contrast.

## 2012-07-23 NOTE — ED Notes (Signed)
Pt. Given 2 warm blankets. 

## 2012-07-23 NOTE — ED Provider Notes (Signed)
History     CSN: [id number]  Arrival date & time 07/23/12  0809   First MD Initiated Contact with Patient 07/23/12 [phone]      Chief Complaint  Patient presents with  . Abdominal Pain    (Consider location/radiation/quality/duration/timing/severity/associated sxs/prior treatment) HPI Ms. Kasparek is a 37 year old female with past history significant for uterine fibroid and ovarian cyst who presents to the ED for abdominal pain and emesis. The lower abdominal pain started 1 month ago. She states it is crampy in nature and she feels bloated. The pain worsened so she went to [facility] ED 3 weeks ago. An abdominal and transvaginal US was completed. She was told she had a uterine fibroid and ovarian cyst. The pain was relieved after one day, but has since returned. She is still having cramps, bloating, and lower abdominal pain R > L. She states the pain radiates to her upper anterior thigh, worsened with movement. She reports some increased urinary frequency. She denies hematuria, dysuria, vaginal bleeding, and vaginal discharge.  Yesterday she developed some upper abdominal pain and nausea. She ate some ice cream after lunch and felt bloated. After several minutes the pain went away, but then returned at dinner. She states she was not able to eat much of her dinner, which consisted of steak and shrimp. She then developed chills in the evening. She had two bouts of emesis overnight, one at 1 am and another at 7 am. She denies any known sick contacts. She denies cough, chest pain, diarrhea, constipation, or hematochezia.  Past Medical History  Diagnosis Date  . Incompetence of cervix   . Prior pregnancy with fetal demise   . PVC's (premature ventricular contractions)   . Gestational diabetes     With G4 diet controlled    Past Surgical History  Procedure Laterality Date  . Leep    . Cervical cerclage    . Abdominal cerclage  05/29/2011    Procedure: CERCLAGE ABDOMINAL;  Surgeon: Juluis Mire, MD;   Location: [facility];  Service: Gynecology;  Laterality: N/A;  need to use mersilene band  . Abdominal cerclage  10/20/2011    Procedure: CERCLAGE ABDOMINAL;  Surgeon: Jeani Hawking, MD;  Location: [facility];  Service: Gynecology;  Laterality: N/A;  Removal of abdominal cerclage    Family History  Problem Relation Age of Onset  . Diabetes Mother   . Diabetes Father   . Heart disease Father   . Hypertension Father     History  Substance Use Topics  . Smoking status: Never Smoker   . Smokeless tobacco: Never Used  . Alcohol Use: No    OB History   Grav Para Term Preterm Abortions TAB SAB Ect Mult Living   7 7 1 6  0 0 0 0 0 4      Review of Systems All other systems negative except as documented in the HPI. All pertinent positives and negatives as reviewed in the HPI.  Allergies  Review of patient's allergies indicates no known allergies.  Home Medications   Current Outpatient Rx  Name  Route  Sig  Dispense  Refill  . promethazine (PHENERGAN) 25 MG tablet   Oral   Take 1 tablet (25 mg total) by mouth every 6 (six) hours as needed for nausea.   12 tablet   0     BP 129/77  Pulse 90  Temp(Src) 98.6 F (37 C) (Oral)  Resp 15  Ht 5\' 5"  (1.651 m)  Wt  170 lb (77.111 kg)  BMI 28.29 kg/m2  SpO2 97%  LMP 07/12/2012  Physical Exam  Nursing note and vitals reviewed. Constitutional: She is oriented to person, place, and time. She appears well-developed and well-nourished.  Obese  HENT:  Head: Normocephalic and atraumatic.  Mouth/Throat: Oropharynx is clear and moist.  Eyes: Pupils are equal, round, and reactive to light.  Neck: Normal range of motion. Neck supple.  Cardiovascular: Normal rate, regular rhythm and normal heart sounds.  Exam reveals no gallop.   No murmur heard. Pulmonary/Chest: Effort normal and breath sounds normal. No respiratory distress.  Abdominal: Soft. Bowel sounds are normal. She exhibits no distension and no mass. There is no rebound and no  guarding.  Moderate tenderness to palpation in LUQ, LLQ, and RLQ. Mild tenderness to palpation of periumbilical and suprapubic regions.  Genitourinary:    Pelvic exam was performed with patient supine. There is no rash or lesion on the right labia. There is no rash or lesion on the left labia. Uterus is not enlarged and not tender. Cervix exhibits no motion tenderness and no friability. Right adnexum displays no mass and no tenderness. Left adnexum displays no mass and no tenderness. No erythema, tenderness or bleeding around the vagina.    Neurological: She is alert and oriented to person, place, and time. She has normal strength.  Skin: Skin is warm and dry. No rash noted.    ED Course  Procedures (including critical care time)  Labs Reviewed  CBC WITH DIFFERENTIAL - Abnormal; Notable for the following:    Neutrophils Relative 85 (*)    Lymphocytes Relative 9 (*)    Lymphs Abs 0.6 (*)    All other components within normal limits  BASIC METABOLIC PANEL - Abnormal; Notable for the following:    Potassium 3.2 (*)    Glucose, Bld 104 (*)    All other components within normal limits  URINALYSIS, ROUTINE W REFLEX MICROSCOPIC - Abnormal; Notable for the following:    Specific Gravity, Urine 1.046 (*)    Hgb urine dipstick TRACE (*)    All other components within normal limits  URINE MICROSCOPIC-ADD ON - Abnormal; Notable for the following:    Squamous Epithelial / LPF FEW (*)    All other components within normal limits  WET PREP, GENITAL  GC/CHLAMYDIA PROBE AMP  PREGNANCY, URINE   Ct Abdomen Pelvis W Contrast  07/23/2012  *RADIOLOGY REPORT*  Clinical Data: Left-sided abdominal pain.  Pelvic pain greater on the left over the past month.  History of tubal ligation.  CT ABDOMEN AND PELVIS WITH CONTRAST  Technique:  Multidetector CT imaging of the abdomen and pelvis was performed following the standard protocol during bolus administration of intravenous contrast.  Contrast:  OMNIPAQUE IOHEXOL 300 MG/ML  SOLN  Comparison: No comparison CT.  Comparison pelvic sonogram 07/08/2012.  Findings: No extraluminal bowel inflammatory process, free fluid or free air.  The appendix is superiorly located between the right kidney and the liver.  Post tubal ligation.  The left ovary is slightly larger than the right. Ultrasound detected left ovarian lesion not well delineated by CT.  Please see prior ultrasound report.  Left sided uterine fibroid.  Mild fatty infiltration of the liver.  No worrisome focal hepatic, splenic, adrenal, renal or pancreatic lesion.  No calcified gallstones.  No abdominal aortic aneurysm.  No adenopathy.  No destructive lesion.  Lung bases clear.  IMPRESSION: No extraluminal bowel inflammatory process, free fluid or free air.  Post tubal  ligation.  The left ovary is slightly larger than the right. Ultrasound detected left ovarian lesion not well delineated by CT.  Please see prior ultrasound report.  Left sided uterine fibroid.  Mild fatty infiltration of the liver.   Original Report Authenticated By: Lacy Duverney, M.D.    The patient is referred back to her GYN. Told to return here as needed. Patient is feeling improved here in the ER.   MDM  MDM Reviewed: nursing note and vitals Interpretation: labs            Carlyle Dolly, PA-C 07/25/12 1111

## 2012-07-24 LAB — GC/CHLAMYDIA PROBE AMP
CT Probe RNA: NEGATIVE
GC Probe RNA: NEGATIVE

## 2012-07-26 NOTE — ED Provider Notes (Signed)
Medical screening examination/treatment/procedure(s) were performed by non-physician practitioner and as supervising physician I was immediately available for consultation/collaboration.  Lakeena Downie L Westly Hinnant, MD 07/26/12 0805 

## 2013-02-06 ENCOUNTER — Encounter (HOSPITAL_COMMUNITY): Payer: Self-pay | Admitting: *Deleted

## 2013-02-06 ENCOUNTER — Emergency Department (HOSPITAL_COMMUNITY)
Admission: EM | Admit: 2013-02-06 | Discharge: 2013-02-06 | Disposition: A | Discharge status: Home / Self Care | Payer: Medicaid Other | Attending: Emergency Medicine | Admitting: Emergency Medicine

## 2013-02-06 DIAGNOSIS — N949 Unspecified condition associated with female genital organs and menstrual cycle: Secondary | ICD-10-CM | POA: Insufficient documentation

## 2013-02-06 DIAGNOSIS — N83209 Unspecified ovarian cyst, unspecified side: Secondary | ICD-10-CM | POA: Insufficient documentation

## 2013-02-06 DIAGNOSIS — R102 Pelvic and perineal pain: Secondary | ICD-10-CM

## 2013-02-06 DIAGNOSIS — D259 Leiomyoma of uterus, unspecified: Secondary | ICD-10-CM | POA: Insufficient documentation

## 2013-02-06 DIAGNOSIS — Z3202 Encounter for pregnancy test, result negative: Secondary | ICD-10-CM | POA: Insufficient documentation

## 2013-02-06 DIAGNOSIS — N83201 Unspecified ovarian cyst, right side: Secondary | ICD-10-CM

## 2013-02-06 DIAGNOSIS — Z8679 Personal history of other diseases of the circulatory system: Secondary | ICD-10-CM | POA: Insufficient documentation

## 2013-02-06 DIAGNOSIS — D219 Benign neoplasm of connective and other soft tissue, unspecified: Secondary | ICD-10-CM

## 2013-02-06 LAB — URINALYSIS, ROUTINE W REFLEX MICROSCOPIC
Bilirubin Urine: NEGATIVE
Ketones, ur: NEGATIVE mg/dL
Nitrite: NEGATIVE
Urobilinogen, UA: 0.2 mg/dL (ref 0.0–1.0)

## 2013-02-06 LAB — URINE MICROSCOPIC-ADD ON

## 2013-02-06 LAB — COMPREHENSIVE METABOLIC PANEL
Alkaline Phosphatase: 78 U/L (ref 39–117)
BUN: 12 mg/dL (ref 6–23)
Chloride: 101 mEq/L (ref 96–112)
GFR calc Af Amer: >90 mL/min (ref >90)
GFR calc non Af Amer: >90 mL/min (ref >90)
Glucose, Bld: 91 mg/dL (ref 70–99)
Potassium: 3.7 mEq/L (ref 3.5–5.1)
Total Bilirubin: 0.2 mg/dL — ABNORMAL LOW (ref 0.3–1.2)

## 2013-02-06 LAB — CBC WITH DIFFERENTIAL/PLATELET
HCT: 37 % (ref 36.0–46.0)
Hemoglobin: 12.8 g/dL (ref 12.0–15.0)
Lymphs Abs: 3.6 10*3/uL (ref 0.7–4.0)
MCH: 28.8 pg (ref 26.0–34.0)
Monocytes Relative: 8 % (ref 3–12)
Neutro Abs: 2.9 10*3/uL (ref 1.7–7.7)
Neutrophils Relative %: 40 % — ABNORMAL LOW (ref 43–77)
RBC: 4.45 MIL/uL (ref 3.87–5.11)

## 2013-02-06 LAB — WET PREP, GENITAL
Trich, Wet Prep: NONE SEEN
Yeast Wet Prep HPF POC: NONE SEEN

## 2013-02-06 LAB — LIPASE, BLOOD: Lipase: 41 U/L (ref 11–59)

## 2013-02-06 MED ORDER — IBUPROFEN 800 MG PO TABS: 800.0000 mg | ORAL_TABLET | Freq: Three times a day (TID) | ORAL | Status: DC | PRN

## 2013-02-06 MED ORDER — HYDROCODONE-ACETAMINOPHEN 5-325 MG PO TABS: 1.0000 | ORAL_TABLET | Freq: Four times a day (QID) | ORAL | Status: DC | PRN

## 2013-02-06 MED ORDER — IBUPROFEN 800 MG PO TABS
800.0000 mg | ORAL_TABLET | Freq: Once | ORAL | Status: AC
  Administered 2013-02-06: 800 mg via ORAL
  Filled 2013-02-06: qty 1

## 2013-02-06 NOTE — ED Provider Notes (Addendum)
CSN: [id number]     Arrival date & time 02/06/13  1919 History   First MD Initiated Contact with Patient 02/06/13 1954     Chief Complaint  Patient presents with  . Abdominal Pain   (Consider location/radiation/quality/duration/timing/severity/associated sxs/prior Treatment) Patient is a 37 y.o. female presenting with abdominal pain. The history is provided by the patient.  Abdominal Pain Pain location:  Suprapubic (pelvic pain, across lower abdomen) Pain quality: aching and cramping   Pain radiates to:  Does not radiate Pain severity:  Moderate Onset quality:  Gradual Duration:  2 weeks Timing:  Constant Progression:  Unchanged Chronicity:  Recurrent Context comment:  Hx of ovarian cysts and fibroids Relieved by:  Acetaminophen Worsened by:  Nothing tried Associated symptoms: no cough, no diarrhea, no fever, no shortness of breath and no vomiting     Past Medical History  Diagnosis Date  . Incompetence of cervix   . Prior pregnancy with fetal demise   . PVC's (premature ventricular contractions)   . Gestational diabetes     With G4 diet controlled   Past Surgical History  Procedure Laterality Date  . Leep    . Cervical cerclage    . Abdominal cerclage  05/29/2011    Procedure: CERCLAGE ABDOMINAL;  Surgeon: Juluis Mire, MD;  Location: [facility];  Service: Gynecology;  Laterality: N/A;  need to use mersilene band  . Abdominal cerclage  10/20/2011    Procedure: CERCLAGE ABDOMINAL;  Surgeon: Jeani Hawking, MD;  Location: [facility];  Service: Gynecology;  Laterality: N/A;  Removal of abdominal cerclage   Family History  Problem Relation Age of Onset  . Diabetes Mother   . Diabetes Father   . Heart disease Father   . Hypertension Father    History  Substance Use Topics  . Smoking status: Never Smoker   . Smokeless tobacco: Never Used  . Alcohol Use: No   OB History   Grav Para Term Preterm Abortions TAB SAB Ect Mult Living   7 7 1 6  0 0 0 0 0 4     Review of  Systems  Constitutional: Negative for fever.  Respiratory: Negative for cough and shortness of breath.   Gastrointestinal: Negative for vomiting, abdominal pain and diarrhea.  All other systems reviewed and are negative.    Allergies  Review of patient's allergies indicates no known allergies.  Home Medications   Current Outpatient Rx  Name  Route  Sig  Dispense  Refill  . aspirin-acetaminophen-caffeine (EXCEDRIN MIGRAINE) 250-250-65 MG per tablet   Oral   Take 2 tablets by mouth every 6 (six) hours as needed for pain.          BP 162/97  Pulse 80  Temp(Src) 98.8 F (37.1 C) (Oral)  Resp 16  Ht 5\' 5"  (1.651 m)  Wt 170 lb (77.111 kg)  BMI 28.29 kg/m2  SpO2 99%  LMP 01/07/2013 Physical Exam  Nursing note and vitals reviewed. Constitutional: She is oriented to person, place, and time. She appears well-developed and well-nourished. No distress.  HENT:  Head: Normocephalic and atraumatic.  Eyes: EOM are normal. Pupils are equal, round, and reactive to light.  Neck: Normal range of motion. Neck supple.  Cardiovascular: Normal rate and regular rhythm.  Exam reveals no friction rub.   No murmur heard. Pulmonary/Chest: Effort normal and breath sounds normal. No respiratory distress. She has no wheezes. She has no rales.  Abdominal: Soft. She exhibits no distension. There is no tenderness. There is  no rebound.  Genitourinary: Vagina normal. Uterus is tender (mild upper vaginal tenderness). Right adnexum displays no mass, no tenderness and no fullness. Left adnexum displays no mass, no tenderness and no fullness.  Musculoskeletal: Normal range of motion. She exhibits no edema.  Neurological: She is alert and oriented to person, place, and time.  Skin: She is not diaphoretic.    ED Course  Procedures (including critical care time) Labs Review Labs Reviewed  CBC WITH DIFFERENTIAL - Abnormal; Notable for the following:    Neutrophils Relative % 40 (*)    Lymphocytes Relative  50 (*)    All other components within normal limits  URINALYSIS, ROUTINE W REFLEX MICROSCOPIC - Abnormal; Notable for the following:    Hgb urine dipstick SMALL (*)    All other components within normal limits  GC/CHLAMYDIA PROBE AMP  WET PREP, GENITAL  URINE MICROSCOPIC-ADD ON  COMPREHENSIVE METABOLIC PANEL  LIPASE, BLOOD  POCT PREGNANCY, URINE   Imaging Review No results found.  MDM   1. Pelvic pain   2. Bilateral ovarian cysts   3. Fibroid    37 year old female presents with abdominal pain. Been off for 2 weeks. Described as lower abdominal pain, nonradiating, consistent cramping. Mild relief with Excedrin tablets. Similar to her prior ovarian cyst pain and fibroid pain. Record review shows cyst diagnosed in March and fibroids diagnosed later that month. She states she evidently has this pain. Denies any abdominal pain, nausea, vomiting, diarrhea. Denies any vaginal bleeding, vaginal discharge. Has some mild urinary frequency, no urgency and no dysuria. On exam abdomen is benign. She has normal urine studies. On pelvic exam she has no adnexal tenderness, no adnexal masses. Patient instructed to take motrin for pain for next several days and vicodin for breakthrough. I do not feel she needs emergent imaging at this time. She's had this pain and it is consistent with prior pain from her fibroid and ovarian cysts.     Dagmar Hait, MD 02/06/13 2352  Dagmar Hait, MD 02/06/13 [phone]

## 2013-02-06 NOTE — ED Notes (Signed)
The pt is c/o lower abd pain.  She was diagnosed with fibroids in April.  She does not have insurance so she cannot see her ob doctor. lmp last month.  No nv or diarrhea

## 2013-02-06 NOTE — ED Notes (Signed)
PT comfortable with d/c and f/u instructions. Prescriptions x2 

## 2013-07-15 ENCOUNTER — Other Ambulatory Visit (HOSPITAL_COMMUNITY): Payer: Self-pay | Admitting: Obstetrics and Gynecology

## 2013-07-15 DIAGNOSIS — I493 Ventricular premature depolarization: Secondary | ICD-10-CM

## 2013-07-15 DIAGNOSIS — R011 Cardiac murmur, unspecified: Secondary | ICD-10-CM

## 2013-07-27 ENCOUNTER — Ambulatory Visit (HOSPITAL_COMMUNITY): Payer: BC Managed Care – PPO

## 2013-08-11 ENCOUNTER — Ambulatory Visit (HOSPITAL_COMMUNITY): Payer: BC Managed Care – PPO

## 2013-08-19 ENCOUNTER — Other Ambulatory Visit (HOSPITAL_COMMUNITY): Payer: BC Managed Care – PPO

## 2013-08-30 ENCOUNTER — Encounter: Payer: Self-pay | Admitting: Cardiovascular Disease

## 2013-08-30 ENCOUNTER — Ambulatory Visit (HOSPITAL_COMMUNITY): Payer: BC Managed Care – PPO | Attending: Cardiovascular Disease | Admitting: Radiology

## 2013-08-30 ENCOUNTER — Other Ambulatory Visit (HOSPITAL_COMMUNITY): Payer: Self-pay | Admitting: Obstetrics and Gynecology

## 2013-08-30 ENCOUNTER — Other Ambulatory Visit: Payer: Self-pay

## 2013-08-30 DIAGNOSIS — R011 Cardiac murmur, unspecified: Secondary | ICD-10-CM

## 2013-08-30 NOTE — Progress Notes (Signed)
Echocardiogram performed.  

## 2013-09-19 ENCOUNTER — Ambulatory Visit (INDEPENDENT_AMBULATORY_CARE_PROVIDER_SITE_OTHER): Payer: BC Managed Care – PPO | Admitting: Cardiovascular Disease

## 2013-09-19 ENCOUNTER — Encounter: Payer: Self-pay | Admitting: Cardiovascular Disease

## 2013-09-19 VITALS — BP 130/96 | HR 73 | Ht 65.0 in | Wt 189.0 lb

## 2013-09-19 DIAGNOSIS — I10 Essential (primary) hypertension: Secondary | ICD-10-CM

## 2013-09-19 DIAGNOSIS — I253 Aneurysm of heart: Secondary | ICD-10-CM | POA: Insufficient documentation

## 2013-09-19 DIAGNOSIS — I491 Atrial premature depolarization: Secondary | ICD-10-CM

## 2013-09-19 NOTE — Patient Instructions (Signed)
Your physician recommends that you schedule a follow-up appointment in: as needed with Dr. Nahser  

## 2013-09-19 NOTE — Assessment & Plan Note (Signed)
She has PACs on exam and on echo.  These are benign.

## 2013-09-19 NOTE — Assessment & Plan Note (Signed)
Trenda has history of high blood pressure. She is making an effort to avoid salt but is distorted doing so. She's also just started an exercise program. We will continue with her same medications. I'll have her followup with her medical doctor. I'll plan to see her in the future if needed

## 2013-09-19 NOTE — Assessment & Plan Note (Signed)
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    This is a benign finding and does not need any additional follow up or evaluation.

## 2013-09-19 NOTE — Progress Notes (Signed)
     Jennifer Haas Date of Birth  1975/06/29       [facility]    [employer] [address], [address], [place], [place]  [address]   [employer], [place]  [address] [place]   Fax  [phone]     Fax [phone]  Problem List: 1. Atrial septal aneurism 2. HTN 3. Premature atrial contractions.  History of Present Illness:  Jennifer Haas is seen today for 2nd opinion of an atrial septal aneurism.  She has a hx of HTN and palpitations  ( has PACs on ECG)  .  She still eats salt.   She is a Lexicographer at an apt. Complex.    She just started an exercise program.    She is feeling well with the exercise.    Non smoker  FHx:  Father has CAD     No current outpatient prescriptions on file prior to visit.   No current facility-administered medications on file prior to visit.    No Known Allergies  Past Medical History  Diagnosis Date  . Incompetence of cervix   . Prior pregnancy with fetal demise   . PVC's (premature ventricular contractions)   . Gestational diabetes     With G4 diet controlled    Past Surgical History  Procedure Laterality Date  . Leep    . Cervical cerclage    . Abdominal cerclage  05/29/2011    Procedure: CERCLAGE ABDOMINAL;  Surgeon: Darlyn Chamber, MD;  Location: [facility];  Service: Gynecology;  Laterality: N/A;  need to use mersilene band  . Abdominal cerclage  10/20/2011    Procedure: CERCLAGE ABDOMINAL;  Surgeon: Cyril Mourning, MD;  Location: [facility];  Service: Gynecology;  Laterality: N/A;  Removal of abdominal cerclage    History  Smoking status  . Never Smoker   Smokeless tobacco  . Never Used    History  Alcohol Use No    Family History  Problem Relation Age of Onset  . Diabetes Mother   . Diabetes Father   . Heart disease Father   . Hypertension Father     Reviw of Systems:  Reviewed in the HPI.  All other systems are negative.  Physical Exam: Blood  pressure 130/96, pulse 73, height 5\' 5"  (1.651 m), weight 189 lb (85.73 kg). Wt Readings from Last 3 Encounters:  09/19/13 189 lb (85.73 kg)  02/06/13 170 lb (77.111 kg)  07/23/12 170 lb (77.111 kg)     General: Well developed, well nourished, in no acute distress.  Head: Normocephalic, atraumatic, sclera non-icteric, mucus membranes are moist,   Neck: Supple. Carotids are 2 + without bruits. No JVD   Lungs: Clear   Heart: RR with frequent premature beats  Abdomen: Soft, non-tender, non-distended with normal bowel sounds.  Msk:  Strength and tone are normal   Extremities: No clubbing or cyanosis. No edema.  Distal pedal pulses are 2+ and equal    Neuro: CN II - XII intact.  Alert and oriented X 3.   Psych:  Normal   ECG:  09/19/2013: Normal sinus rhythm at 72. She has frequent premature atrial contractions.  Assessment / Plan:

## 2013-11-16 IMAGING — CT CT ABD-PELV W/ CM
2 of 4 series · 14 of 32 positions shown, 19 images · IV contrast (water/omni  & 100ml omni 300)
Comparison: No comparison CT.  Comparison pelvic sonogram
07/08/2012.

CLINICAL DATA: Left-sided abdominal pain.  Pelvic pain greater on
the left over the past month.  History of tubal ligation.

CT ABDOMEN AND PELVIS WITH CONTRAST
TECHNIQUE: Multidetector CT imaging of the abdomen and pelvis was
performed following the standard protocol during bolus
administration of intravenous contrast.
Contrast: 100mL OMNIPAQUE IOHEXOL 300 MG/ML  SOLN

[Series 2: routine abdomen · axial · 0.79mm/px · z∈[-372,-22]mm · 8 of 92 slices shown, 13 images]
[im 11/92  soft-tissue]
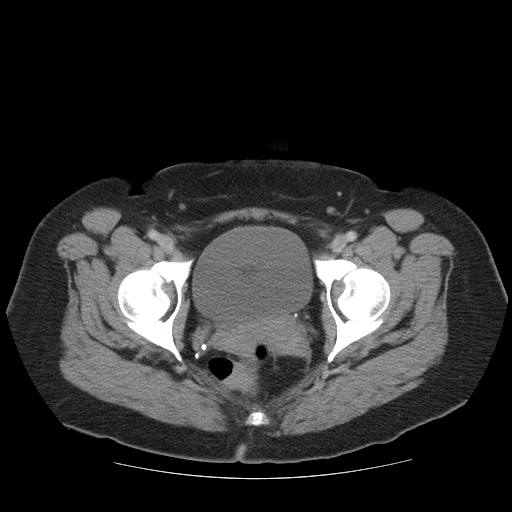
[im 11/92  bone]
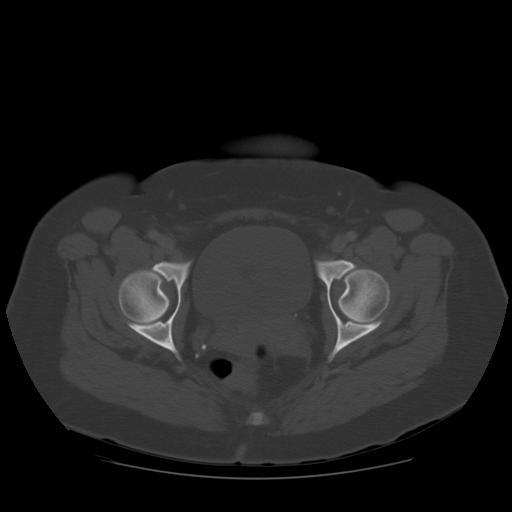
[im 21/92  soft-tissue]
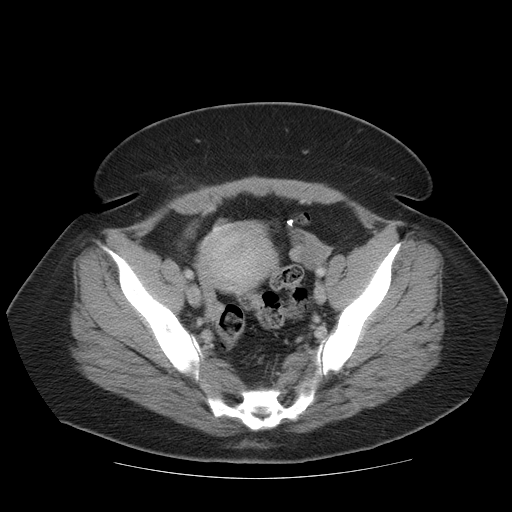
[im 31/92  soft-tissue]
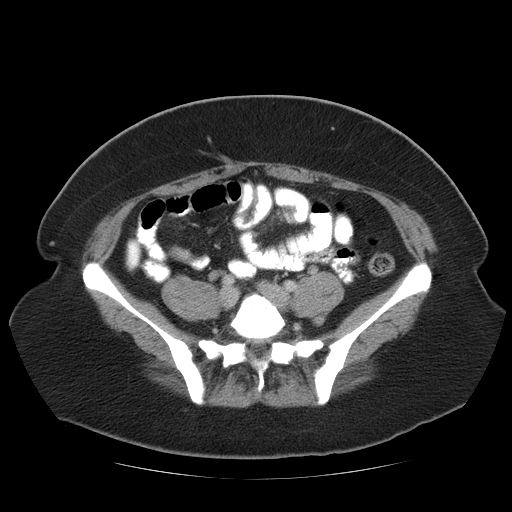
[im 41/92  soft-tissue]
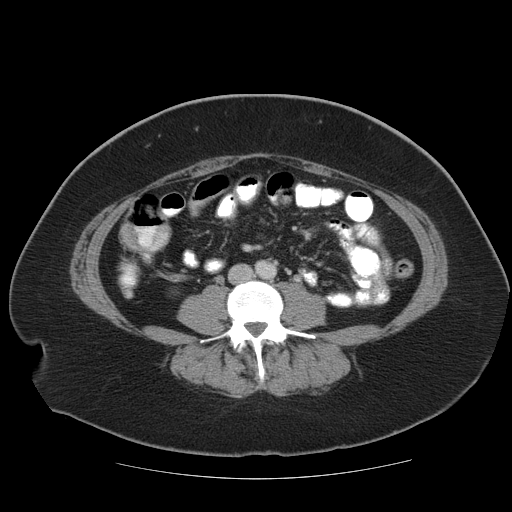
[im 51/92  soft-tissue]
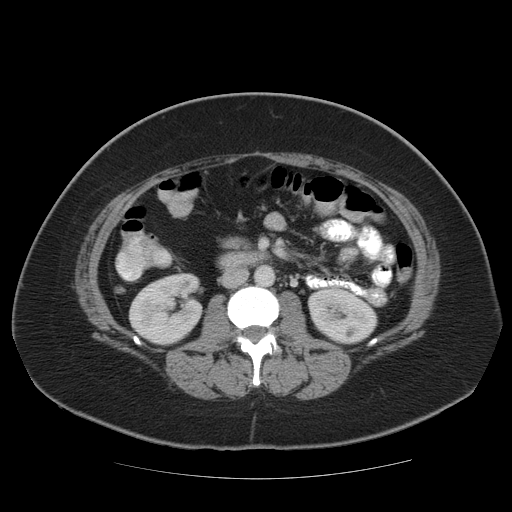
[im 51/92  lung]
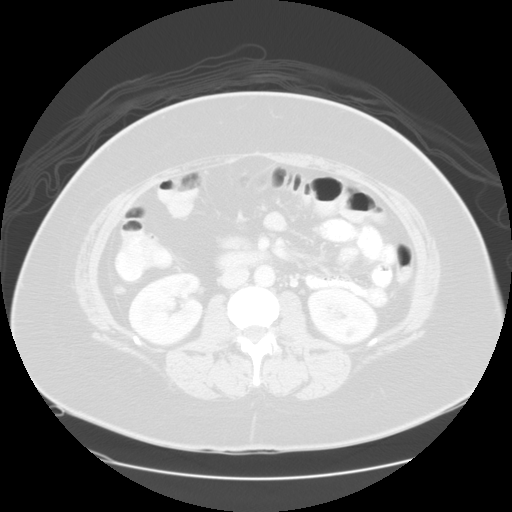
[im 61/92  soft-tissue]
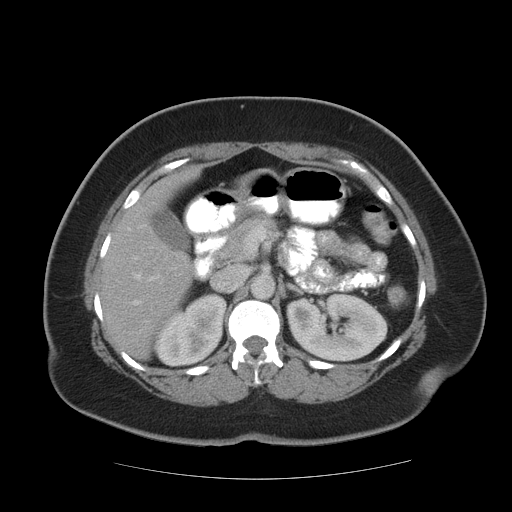
[im 61/92  lung]
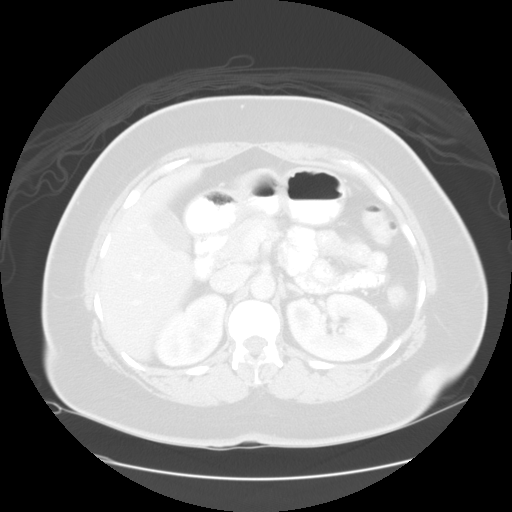
[im 71/92  soft-tissue]
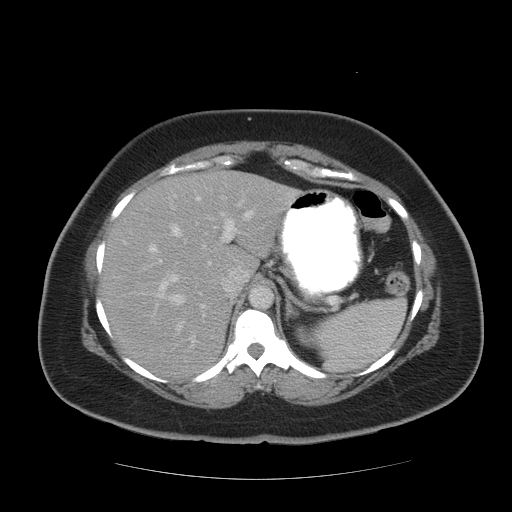
[im 71/92  lung]
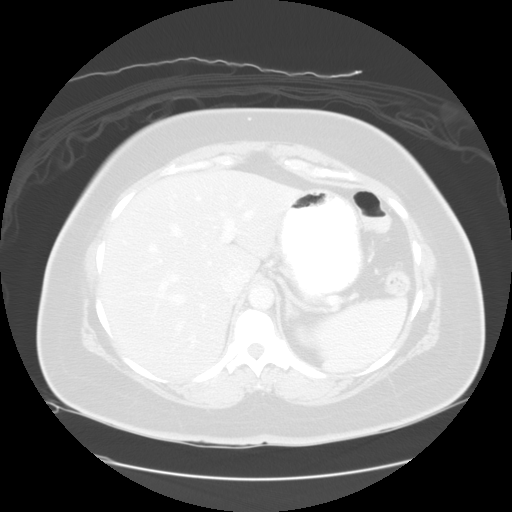
[im 81/92  soft-tissue]
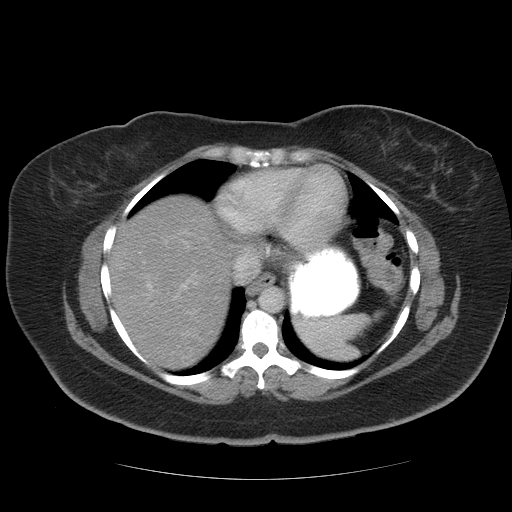
[im 81/92  lung]
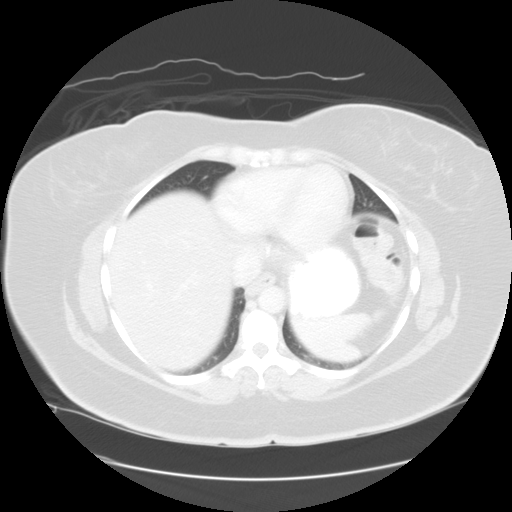

[Series 401: sagittals · sagittal · 0.97mm/px · 6 of 113 slices shown]
[im 12/113  soft-tissue]
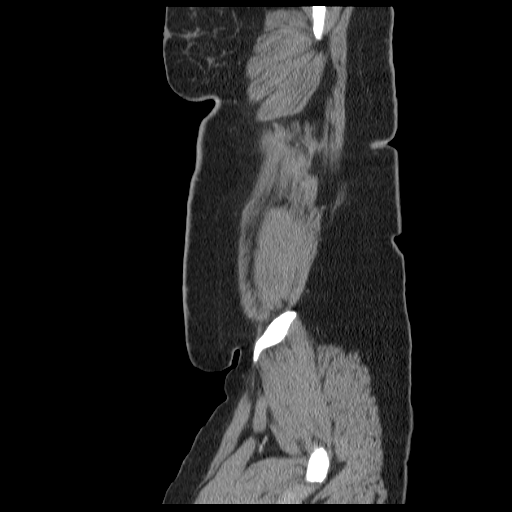
[im 23/113  soft-tissue]
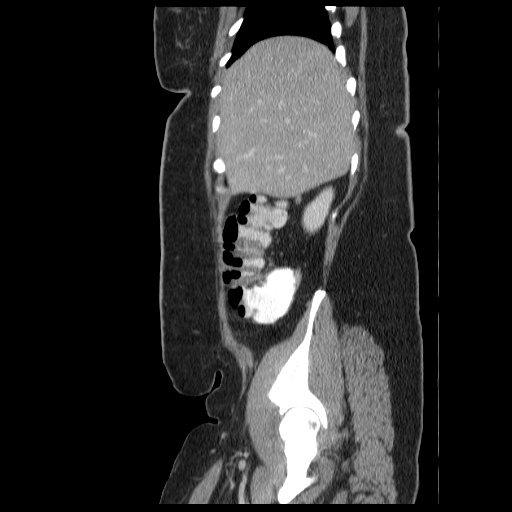
[im 34/113  soft-tissue]
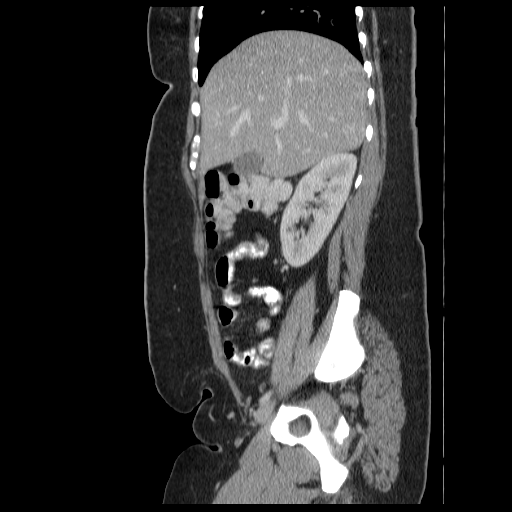
[im 45/113  soft-tissue]
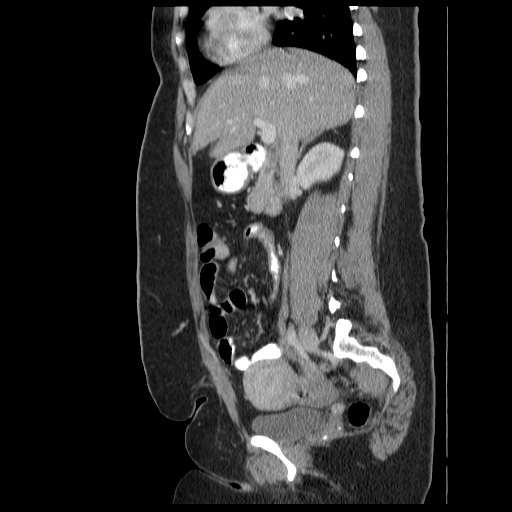
[im 68/113  soft-tissue]
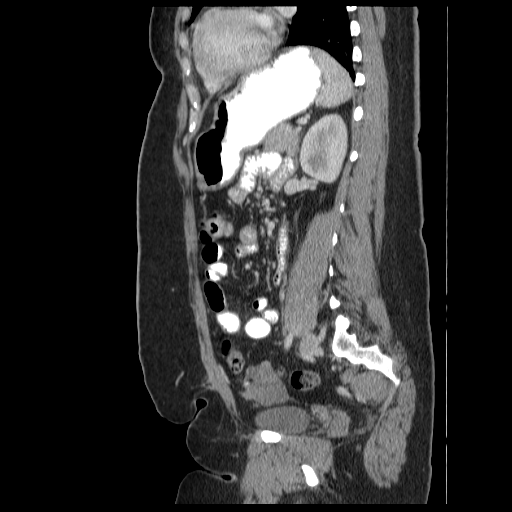
[im 79/113  soft-tissue]
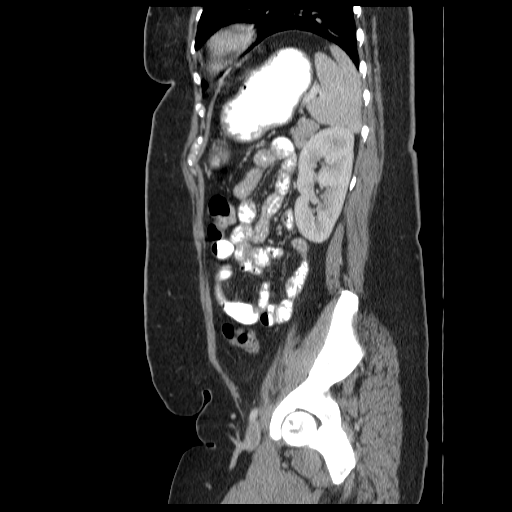

[14 of 32 positions shown; findings below may reference images not displayed]

FINDINGS: No extraluminal bowel inflammatory process, free fluid or
free air.  The appendix is superiorly located between the right
kidney and the liver.

Post tubal ligation.  The left ovary is slightly larger than the
right. Ultrasound detected left ovarian lesion not well delineated
by CT.  Please see prior ultrasound report.

Left sided uterine fibroid.

Mild fatty infiltration of the liver.  No worrisome focal hepatic,
splenic, adrenal, renal or pancreatic lesion.  No calcified
gallstones.

No abdominal aortic aneurysm.

No adenopathy.

No destructive lesion.

Lung bases clear.
IMPRESSION: No extraluminal bowel inflammatory process, free fluid or free air.

Post tubal ligation.  The left ovary is slightly larger than the
right. Ultrasound detected left ovarian lesion not well delineated
by CT.  Please see prior ultrasound report.

Left sided uterine fibroid.

Mild fatty infiltration of the liver..

## 2014-03-06 ENCOUNTER — Encounter: Payer: Self-pay | Admitting: Cardiovascular Disease

## 2014-07-22 ENCOUNTER — Encounter (HOSPITAL_COMMUNITY): Payer: Self-pay | Admitting: Emergency Medicine

## 2014-07-22 ENCOUNTER — Emergency Department (HOSPITAL_COMMUNITY)
Admission: EM | Admit: 2014-07-22 | Discharge: 2014-07-22 | Disposition: A | Discharge status: Home / Self Care | Payer: BLUE CROSS/BLUE SHIELD | Attending: Emergency Medicine | Admitting: Emergency Medicine

## 2014-07-22 DIAGNOSIS — R509 Fever, unspecified: Secondary | ICD-10-CM | POA: Diagnosis present

## 2014-07-22 DIAGNOSIS — Z8632 Personal history of gestational diabetes: Secondary | ICD-10-CM | POA: Diagnosis not present

## 2014-07-22 DIAGNOSIS — I10 Essential (primary) hypertension: Secondary | ICD-10-CM | POA: Diagnosis not present

## 2014-07-22 DIAGNOSIS — J111 Influenza due to unidentified influenza virus with other respiratory manifestations: Secondary | ICD-10-CM | POA: Diagnosis not present

## 2014-07-22 DIAGNOSIS — Z87448 Personal history of other diseases of urinary system: Secondary | ICD-10-CM | POA: Insufficient documentation

## 2014-07-22 LAB — I-STAT CG4 LACTIC ACID, ED: LACTIC ACID, VENOUS: 1.52 mmol/L (ref 0.5–2.0)

## 2014-07-22 MED ORDER — ACETAMINOPHEN 325 MG PO TABS
ORAL_TABLET | ORAL | Status: AC
  Filled 2014-07-22: qty 2

## 2014-07-22 MED ORDER — OSELTAMIVIR PHOSPHATE 75 MG PO CAPS: 75.0000 mg | ORAL_CAPSULE | Freq: Two times a day (BID) | ORAL | Status: DC

## 2014-07-22 MED ORDER — ACETAMINOPHEN 325 MG PO TABS
650.0000 mg | ORAL_TABLET | Freq: Four times a day (QID) | ORAL | Status: DC | PRN
  Administered 2014-07-22: 650 mg via ORAL

## 2014-07-22 NOTE — ED Provider Notes (Addendum)
CSN: [id number]     Arrival date & time 07/22/14  0535 History   First MD Initiated Contact with Patient 07/22/14 0732     Chief Complaint  Patient presents with  . Fever  . Generalized Body Aches  . Cough     (Consider location/radiation/quality/duration/timing/severity/associated sxs/prior Treatment) Patient is a 39 y.o. female presenting with fever and cough. The history is provided by the patient.  Fever Max temp prior to arrival:  101 Temp source:  Oral Severity:  Severe Onset quality:  Gradual Duration:  3 days Timing:  Constant Progression:  Unchanged Chronicity:  New Relieved by:  Acetaminophen and ibuprofen Worsened by:  Nothing tried Ineffective treatments:  None tried Associated symptoms: chills, congestion, cough, myalgias and sore throat   Associated symptoms: no chest pain, no diarrhea, no dysuria, no nausea and no vomiting   Associated symptoms comment:  Only had one episode of vomiting post-tussive yesterday Risk factors: sick contacts   Risk factors: no hx of cancer, no immunosuppression and no recent surgery   Cough Associated symptoms: chills, fever, myalgias and sore throat   Associated symptoms: no chest pain     Past Medical History  Diagnosis Date  . Incompetence of cervix   . Prior pregnancy with fetal demise   . PVC's (premature ventricular contractions)   . Gestational diabetes     With G4 diet controlled  . Hypertension    Past Surgical History  Procedure Laterality Date  . Leep    . Cervical cerclage    . Abdominal cerclage  05/29/2011    Procedure: CERCLAGE ABDOMINAL;  Surgeon: Darlyn Chamber, MD;  Location: [facility];  Service: Gynecology;  Laterality: N/A;  need to use mersilene band  . Abdominal cerclage  10/20/2011    Procedure: CERCLAGE ABDOMINAL;  Surgeon: Cyril Mourning, MD;  Location: [facility];  Service: Gynecology;  Laterality: N/A;  Removal of abdominal cerclage   Family History  Problem Relation Age of Onset  . Diabetes Mother    . Diabetes Father   . Heart disease Father   . Hypertension Father    History  Substance Use Topics  . Smoking status: Never Smoker   . Smokeless tobacco: Never Used  . Alcohol Use: No   OB History    Gravida Para Term Preterm AB TAB SAB Ectopic Multiple Living   7 7 1 6  0 0 0 0 0 4     Review of Systems  Constitutional: Positive for fever and chills.  HENT: Positive for congestion and sore throat.   Respiratory: Positive for cough.   Cardiovascular: Negative for chest pain.  Gastrointestinal: Negative for nausea, vomiting and diarrhea.  Genitourinary: Negative for dysuria.  Musculoskeletal: Positive for myalgias.  All other systems reviewed and are negative.     Allergies  Review of patient's allergies indicates no known allergies.  Home Medications   Prior to Admission medications   Medication Sig Start Date End Date Taking? Authorizing Provider  hydrochlorothiazide (HYDRODIURIL) 25 MG tablet  09/07/13   Historical Provider, MD   BP 138/81 mmHg  Pulse 97  Temp(Src) 101.4 F (38.6 C) (Oral)  Resp 16  Ht 5\' 5"  (1.651 m)  Wt 156 lb (70.761 kg)  BMI 25.96 kg/m2  SpO2 97%  LMP 07/22/2014 Physical Exam  Constitutional: She is oriented to person, place, and time. She appears well-developed and well-nourished. No distress.  HENT:  Head: Normocephalic and atraumatic.  Right Ear: Tympanic membrane normal.  Left Ear: Tympanic membrane  normal.  Nose: Mucosal edema present.  Mouth/Throat: Posterior oropharyngeal erythema present. No oropharyngeal exudate, posterior oropharyngeal edema or tonsillar abscesses.  Eyes: EOM are normal. Pupils are equal, round, and reactive to light.  Cardiovascular: Normal rate, regular rhythm, normal heart sounds and intact distal pulses.  Exam reveals no friction rub.   No murmur heard. Pulmonary/Chest: Effort normal and breath sounds normal. She has no wheezes. She has no rales.  Abdominal: Soft. Bowel sounds are normal. She exhibits no  distension. There is no tenderness. There is no rebound and no guarding.  Musculoskeletal: Normal range of motion. She exhibits no tenderness.  No edema  Neurological: She is alert and oriented to person, place, and time. No cranial nerve deficit.  Skin: Skin is warm and dry. No rash noted.  Psychiatric: She has a normal mood and affect. Her behavior is normal.  Nursing note and vitals reviewed.   ED Course  Procedures (including critical care time) Labs Review Labs Reviewed  I-STAT CG4 LACTIC ACID, ED  POC URINE PREG, ED    Imaging Review No results found.   EKG Interpretation None      MDM   Final diagnoses:  Influenza    Pt with symptoms consistent with influenza.  Normal exam here but is febrile.  No signs of breathing difficulty  No signs of strep pharyngitis, otitis or abnormal abdominal findings.   Repeat temperature was 99.7.  Will continue antipyretica and rest and fluids and return for any further problems. Patient given Tamiflu and discharged home.     Blanchie Dessert, MD 07/22/14 5436  Blanchie Dessert, MD 07/22/14 0677  Blanchie Dessert, MD 07/22/14 [phone]

## 2014-07-22 NOTE — ED Notes (Signed)
PT ambulated with baseline gait; VSS; A&Ox3; no signs of distress; respirations even and unlabored; skin warm and dry; no questions upon discharge.  

## 2014-07-22 NOTE — ED Notes (Signed)
C/o non-productive cough, chills, fever, headache, and generalized body aches since Thursday.  Vomited x 1 yesterday.

## 2015-03-03 ENCOUNTER — Emergency Department (HOSPITAL_COMMUNITY): Payer: BLUE CROSS/BLUE SHIELD

## 2015-03-03 ENCOUNTER — Emergency Department (HOSPITAL_COMMUNITY)
Admission: EM | Admit: 2015-03-03 | Discharge: 2015-03-03 | Disposition: A | Discharge status: Home / Self Care | Payer: BLUE CROSS/BLUE SHIELD | Attending: Emergency Medicine | Admitting: Emergency Medicine

## 2015-03-03 ENCOUNTER — Encounter (HOSPITAL_COMMUNITY): Payer: Self-pay | Admitting: Nurse Practitioner

## 2015-03-03 DIAGNOSIS — M25512 Pain in left shoulder: Secondary | ICD-10-CM | POA: Diagnosis present

## 2015-03-03 DIAGNOSIS — Z8632 Personal history of gestational diabetes: Secondary | ICD-10-CM | POA: Diagnosis not present

## 2015-03-03 DIAGNOSIS — I10 Essential (primary) hypertension: Secondary | ICD-10-CM | POA: Insufficient documentation

## 2015-03-03 MED ORDER — CYCLOBENZAPRINE HCL 10 MG PO TABS: 10.0000 mg | ORAL_TABLET | Freq: Two times a day (BID) | ORAL | Status: DC | PRN

## 2015-03-03 NOTE — ED Provider Notes (Signed)
CSN: [id number]     Arrival date & time 03/03/15  1510 History  By signing my name below, I, Stephania Fragmin, attest that this documentation has been prepared under the direction and in the presence of Zimere Dunlevy, PA-C. Electronically Signed: Stephania Fragmin, ED Scribe. 03/03/2015. 4:07 PM.      Chief Complaint  Patient presents with  . Shoulder Pain   The history is provided by the patient. No language interpreter was used.   HPI Comments: Jennifer Haas is a 39 y.o. female who presents to the Emergency Department complaining of unprovoked, throbbing, constant, aching, left shoulder pain that began 4 days when she woke up. the pain does not radiate. She denies any known trauma, injury, or recent changes in exercise or activity. She is unsure if she slept on her arm in any unusual position. Patient has tried IcyHot cream, a heating pad, and Tylenol, which all provide moderate, temporary relief. Denies aggravating factors; she is able to fully move the shoulder without increasing the pain. She denies a history of any prior left shoulder injuries. She also denies numbness, tingling, or weakness in the left arm. Denies wounds, rashes or color changes of the left shoulder or arm.   Past Medical History  Diagnosis Date  . Incompetence of cervix   . Prior pregnancy with fetal demise   . PVC's (premature ventricular contractions)   . Gestational diabetes     With G4 diet controlled  . Hypertension    Past Surgical History  Procedure Laterality Date  . Leep    . Cervical cerclage    . Abdominal cerclage  05/29/2011    Procedure: CERCLAGE ABDOMINAL;  Surgeon: Darlyn Chamber, MD;  Location: [facility];  Service: Gynecology;  Laterality: N/A;  need to use mersilene band  . Abdominal cerclage  10/20/2011    Procedure: CERCLAGE ABDOMINAL;  Surgeon: Cyril Mourning, MD;  Location: [facility];  Service: Gynecology;  Laterality: N/A;  Removal of abdominal cerclage  . Tubal ligation     Family History  Problem  Relation Age of Onset  . Diabetes Mother   . Diabetes Father   . Heart disease Father   . Hypertension Father    Social History  Substance Use Topics  . Smoking status: Never Smoker   . Smokeless tobacco: Never Used  . Alcohol Use: No   OB History    Gravida Para Term Preterm AB TAB SAB Ectopic Multiple Living   7 7 1 6  0 0 0 0 0 4     Review of Systems  Constitutional: Negative for fever and chills.  Musculoskeletal: Positive for arthralgias (left shoulder pain). Negative for joint swelling and neck pain.  Skin: Negative for color change and rash.  Neurological: Negative for weakness and numbness.   Allergies  Review of patient's allergies indicates no known allergies.  Home Medications   Prior to Admission medications   Medication Sig Start Date End Date Taking? Authorizing Provider  cyclobenzaprine (FLEXERIL) 10 MG tablet Take 1 tablet (10 mg total) by mouth 2 (two) times daily as needed for muscle spasms. 03/03/15   Aiyannah Fayad, PA-C  hydrochlorothiazide (HYDRODIURIL) 25 MG tablet  09/07/13   Historical Provider, MD  oseltamivir (TAMIFLU) 75 MG capsule Take 1 capsule (75 mg total) by mouth every 12 (twelve) hours. 07/22/14   Blanchie Dessert, MD   BP 115/73 mmHg  Pulse 70  Temp(Src) 98.6 F (37 C) (Oral)  Resp 18  Ht 5\' 5"  (1.651 m)  Wt 184 lb 12.8 oz (83.825 kg)  BMI 30.75 kg/m2  SpO2 99% Physical Exam  Constitutional: She appears well-developed and well-nourished. No distress.  HENT:  Head: Normocephalic and atraumatic.  Right Ear: External ear normal.  Left Ear: External ear normal.  Eyes: Conjunctivae are normal. Right eye exhibits no discharge. Left eye exhibits no discharge. No scleral icterus.  Neck: Normal range of motion.  Cardiovascular: Normal rate and intact distal pulses.   Radial pulse palpable  Pulmonary/Chest: Effort normal.  Musculoskeletal: Normal range of motion.  Left shoulder mildly TTP. Most tenderness is located over trapezius between  humeral head and cervical spine. Muscle spasm appreciated in this area. Full active ROM of shoulder intact. No edema or obvious deformity. Negative neer's and hawkin's.   Neurological: She is alert. Coordination normal.  5/5 strength of BUE. Sensation to light touch intact over arms.   Skin: Skin is warm and dry.  No rashes or skin changes noted over area of pain or left arm.   Psychiatric: She has a normal mood and affect. Her behavior is normal.  Nursing note and vitals reviewed.   ED Course  Procedures (including critical care time)  DIAGNOSTIC STUDIES: Oxygen Saturation is 99% on RA, normal by my interpretation.    COORDINATION OF CARE: 4:04 PM - Suspect muscle spasm. However, will perform XR to r/o bony involvement. Will Rx muscle relaxant in addition to Tylenol and heat at home. Advised pt to f/u with PCP if pain persists/muscle relaxants are not helping after 1 week. Pt verbalized understanding and agreed to plan.   Imaging Review Dg Shoulder Left  03/03/2015  CLINICAL DATA:  Pain to left shoulder for 4 days, no known injury. EXAM: LEFT SHOULDER - 2+ VIEW COMPARISON:  None. FINDINGS: There is no evidence of fracture or dislocation. There is no evidence of arthropathy or other focal bone abnormality. Soft tissues are unremarkable. IMPRESSION: Negative. Electronically Signed   By: Franki Cabot M.D.   On: 03/03/2015 16:31   I have personally reviewed and evaluated these images and lab results as part of my medical decision-making.   MDM   Final diagnoses:  Left shoulder pain   Pt presenting with left shoulder pain x 3 days. No known trauma or recent strenuous activity. Pain is aching and moderately relieved by tylenol, heat and ice. Tenderness over left trapezius with muscle spasm noted. FROM of left shoulder intact. Left arm neurovascularly intact. Negative shoulder xray. Encouraged pt to continue using tylenol, heat and ice for pain management. Will give short course of flexeril  for muscle spasm. Instructed to follow up with PCP if pain persists after conservative treatment.  At this time there does not appear to be any evidence of an acute emergency medical condition and the patient appears stable for discharge with appropriate outpatient follow up. Diagnosis was discussed with patient who verbalizes understanding and is agreeable to discharge. Return precautions given in discharge paperwork and discussed with pt at bedside. Pt stable for discharge     I personally performed the services described in this documentation, which was scribed in my presence. The recorded information has been reviewed and is accurate.     Lahoma Crocker Miran Kautzman, PA-C 03/03/15 1804  Carmin Muskrat, MD 03/04/15 Dyann Kief

## 2015-03-03 NOTE — Discharge Instructions (Signed)
Shoulder Pain The shoulder is the joint that connects your arms to your body. The bones that form the shoulder joint include the upper arm bone (humerus), the shoulder blade (scapula), and the collarbone (clavicle). The top of the humerus is shaped like a ball and fits into a rather flat socket on the scapula (glenoid cavity). A combination of muscles and strong, fibrous tissues that connect muscles to bones (tendons) support your shoulder joint and hold the ball in the socket. Small, fluid-filled sacs (bursae) are located in different areas of the joint. They act as cushions between the bones and the overlying soft tissues and help reduce friction between the gliding tendons and the bone as you move your arm. Your shoulder joint allows a wide range of motion in your arm. This range of motion allows you to do things like scratch your back or throw a ball. However, this range of motion also makes your shoulder more prone to pain from overuse and injury. Causes of shoulder pain can originate from both injury and overuse and usually can be grouped in the following four categories:  Redness, swelling, and pain (inflammation) of the tendon (tendinitis) or the bursae (bursitis).  Instability, such as a dislocation of the joint.  Inflammation of the joint (arthritis).  Broken bone (fracture). HOME CARE INSTRUCTIONS   Apply ice to the sore area.  Put ice in a plastic bag.  Place a towel between your skin and the bag.  Leave the ice on for 15-20 minutes, 3-4 times per day for the first 2 days, or as directed by your health care provider.  Stop using cold packs if they do not help with the pain.  If you have a shoulder sling or immobilizer, wear it as long as your caregiver instructs. Only remove it to shower or bathe. Move your arm as little as possible, but keep your hand moving to prevent swelling.  Squeeze a soft ball or foam pad as much as possible to help prevent swelling.  Only take  over-the-counter or prescription medicines for pain, discomfort, or fever as directed by your caregiver. SEEK MEDICAL CARE IF:   Your shoulder pain increases, or new pain develops in your arm, hand, or fingers.  Your hand or fingers become cold and numb.  Your pain is not relieved with medicines. SEEK IMMEDIATE MEDICAL CARE IF:   Your arm, hand, or fingers are numb or tingling.  Your arm, hand, or fingers are significantly swollen or turn white or blue. MAKE SURE YOU:   Understand these instructions.  Will watch your condition.  Will get help right away if you are not doing well or get worse.   This information is not intended to replace advice given to you by your health care provider. Make sure you discuss any questions you have with your health care provider.   Document Released: 01/29/2005 Document Revised: 05/12/2014 Document Reviewed: 08/14/2014 Elsevier Interactive Patient Education 2016 [place].  Cryotherapy Cryotherapy means treatment with cold. Ice or gel packs can be used to reduce both pain and swelling. Ice is the most helpful within the first 24 to 48 hours after an injury or flare-up from overusing a muscle or joint. Sprains, strains, spasms, burning pain, shooting pain, and aches can all be eased with ice. Ice can also be used when recovering from surgery. Ice is effective, has very few side effects, and is safe for most people to use. PRECAUTIONS  Ice is not a safe treatment option for people with:  Raynaud phenomenon. This is a condition affecting small blood vessels in the extremities. Exposure to cold may cause your problems to return.  Cold hypersensitivity. There are many forms of cold hypersensitivity, including:  Cold urticaria. Red, itchy hives appear on the skin when the tissues begin to warm after being iced.  Cold erythema. This is a red, itchy rash caused by exposure to cold.  Cold hemoglobinuria. Red blood cells break down when the tissues  begin to warm after being iced. The hemoglobin that carry oxygen are passed into the urine because they cannot combine with blood proteins fast enough.  Numbness or altered sensitivity in the area being iced. If you have any of the following conditions, do not use ice until you have discussed cryotherapy with your caregiver:  Heart conditions, such as arrhythmia, angina, or chronic heart disease.  High blood pressure.  Healing wounds or open skin in the area being iced.  Current infections.  Rheumatoid arthritis.  Poor circulation.  Diabetes. Ice slows the blood flow in the region it is applied. This is beneficial when trying to stop inflamed tissues from spreading irritating chemicals to surrounding tissues. However, if you expose your skin to cold temperatures for too long or without the proper protection, you can damage your skin or nerves. Watch for signs of skin damage due to cold. HOME CARE INSTRUCTIONS Follow these tips to use ice and cold packs safely.  Place a dry or damp towel between the ice and skin. A damp towel will cool the skin more quickly, so you may need to shorten the time that the ice is used.  For a more rapid response, add gentle compression to the ice.  Ice for no more than 10 to 20 minutes at a time. The bonier the area you are icing, the less time it will take to get the benefits of ice.  Check your skin after 5 minutes to make sure there are no signs of a poor response to cold or skin damage.  Rest 20 minutes or more between uses.  Once your skin is numb, you can end your treatment. You can test numbness by very lightly touching your skin. The touch should be so light that you do not see the skin dimple from the pressure of your fingertip. When using ice, most people will feel these normal sensations in this order: cold, burning, aching, and numbness.  Do not use ice on someone who cannot communicate their responses to pain, such as small children or  people with dementia. HOW TO MAKE AN ICE PACK Ice packs are the most common way to use ice therapy. Other methods include ice massage, ice baths, and cryosprays. Muscle creams that cause a cold, tingly feeling do not offer the same benefits that ice offers and should not be used as a substitute unless recommended by your caregiver. To make an ice pack, do one of the following:  Place crushed ice or a bag of frozen vegetables in a sealable plastic bag. Squeeze out the excess air. Place this bag inside another plastic bag. Slide the bag into a pillowcase or place a damp towel between your skin and the bag.  Mix 3 parts water with 1 part rubbing alcohol. Freeze the mixture in a sealable plastic bag. When you remove the mixture from the freezer, it will be slushy. Squeeze out the excess air. Place this bag inside another plastic bag. Slide the bag into a pillowcase or place a damp towel between your skin  and the bag. SEEK MEDICAL CARE IF:  You develop white spots on your skin. This may give the skin a blotchy (mottled) appearance.  Your skin turns blue or pale.  Your skin becomes waxy or hard.  Your swelling gets worse. MAKE SURE YOU:   Understand these instructions.  Will watch your condition.  Will get help right away if you are not doing well or get worse.   This information is not intended to replace advice given to you by your health care provider. Make sure you discuss any questions you have with your health care provider.   Document Released: 12/16/2010 Document Revised: 05/12/2014 Document Reviewed: 12/16/2010 Elsevier Interactive Patient Education 2016 [place] therapy can help ease sore, stiff, injured, and tight muscles and joints. Heat relaxes your muscles, which may help ease your pain.  RISKS AND COMPLICATIONS If you have any of the following conditions, do not use heat therapy unless your health care provider has approved:  Poor  circulation.  Healing wounds or scarred skin in the area being treated.  Diabetes, heart disease, or high blood pressure.  Not being able to feel (numbness) the area being treated.  Unusual swelling of the area being treated.  Active infections.  Blood clots.  Cancer.  Inability to communicate pain. This may include young children and people who have problems with their brain function (dementia).  Pregnancy. Heat therapy should only be used on old, pre-existing, or long-lasting (chronic) injuries. Do not use heat therapy on new injuries unless directed by your health care provider. HOW TO USE HEAT THERAPY There are several different kinds of heat therapy, including:  Moist heat pack.  Warm water bath.  Hot water bottle.  Electric heating pad.  Heated gel pack.  Heated wrap.  Electric heating pad. Use the heat therapy method suggested by your health care provider. Follow your health care provider's instructions on when and how to use heat therapy. GENERAL HEAT THERAPY RECOMMENDATIONS  Do not sleep while using heat therapy. Only use heat therapy while you are awake.  Your skin may turn pink while using heat therapy. Do not use heat therapy if your skin turns red.  Do not use heat therapy if you have new pain.  High heat or long exposure to heat can cause burns. Be careful when using heat therapy to avoid burning your skin.  Do not use heat therapy on areas of your skin that are already irritated, such as with a rash or sunburn. SEEK MEDICAL CARE IF:  You have blisters, redness, swelling, or numbness.  You have new pain.  Your pain is worse. MAKE SURE YOU:  Understand these instructions.  Will watch your condition.  Will get help right away if you are not doing well or get worse.   This information is not intended to replace advice given to you by your health care provider. Make sure you discuss any questions you have with your health care provider.    Document Released: 07/14/2011 Document Revised: 05/12/2014 Document Reviewed: 06/14/2013 Elsevier Interactive Patient Education [employer].

## 2015-03-03 NOTE — ED Notes (Signed)
She c/o 3 day history L shoulder pain. She noticed the pain after waking that morning. Denies exertion, strenuous activity, trauma prior to the pain. denies history of shoulder pain. Movement increases the pain. Tried Heat, icy hot, OTC pain pills with moderate pain relief but pain persists. Cms intact

## 2015-03-03 NOTE — ED Notes (Signed)
Declined W/C at D/C and was escorted to lobby by RN. 

## 2015-06-05 ENCOUNTER — Ambulatory Visit: Payer: BLUE CROSS/BLUE SHIELD | Admitting: Cardiovascular Disease

## 2015-06-13 ENCOUNTER — Ambulatory Visit: Payer: BLUE CROSS/BLUE SHIELD | Admitting: Cardiovascular Disease

## 2017-07-21 ENCOUNTER — Other Ambulatory Visit: Payer: Self-pay | Admitting: Obstetrics and Gynecology

## 2017-07-21 DIAGNOSIS — R928 Other abnormal and inconclusive findings on diagnostic imaging of breast: Secondary | ICD-10-CM

## 2017-07-22 ENCOUNTER — Emergency Department (HOSPITAL_COMMUNITY)
Admission: EM | Admit: 2017-07-22 | Discharge: 2017-07-22 | Disposition: A | Discharge status: Home / Self Care | Payer: BLUE CROSS/BLUE SHIELD | Attending: Emergency Medicine | Admitting: Emergency Medicine

## 2017-07-22 ENCOUNTER — Other Ambulatory Visit: Payer: Self-pay

## 2017-07-22 ENCOUNTER — Emergency Department (HOSPITAL_COMMUNITY): Payer: BLUE CROSS/BLUE SHIELD

## 2017-07-22 ENCOUNTER — Encounter (HOSPITAL_COMMUNITY): Payer: Self-pay | Admitting: Emergency Medicine

## 2017-07-22 DIAGNOSIS — M549 Dorsalgia, unspecified: Secondary | ICD-10-CM | POA: Diagnosis not present

## 2017-07-22 DIAGNOSIS — I10 Essential (primary) hypertension: Secondary | ICD-10-CM | POA: Insufficient documentation

## 2017-07-22 DIAGNOSIS — R0789 Other chest pain: Secondary | ICD-10-CM

## 2017-07-22 LAB — BASIC METABOLIC PANEL
ANION GAP: 12 (ref 5–15)
BUN: 16 mg/dL (ref 6–20)
CO2: 22 mmol/L (ref 22–32)
Calcium: 9.3 mg/dL (ref 8.9–10.3)
Chloride: 102 mmol/L (ref 101–111)
Creatinine, Ser: 0.68 mg/dL (ref 0.44–1.00)
GFR calc Af Amer: >60 mL/min (ref >60)
GFR calc non Af Amer: >60 mL/min (ref >60)
GLUCOSE: 184 mg/dL — AB (ref 65–99)
POTASSIUM: 3.5 mmol/L (ref 3.5–5.1)
Sodium: 136 mmol/L (ref 135–145)

## 2017-07-22 LAB — CBC
HEMATOCRIT: 39 % (ref 36.0–46.0)
HEMOGLOBIN: 12.8 g/dL (ref 12.0–15.0)
MCH: 28.4 pg (ref 26.0–34.0)
MCHC: 32.8 g/dL (ref 30.0–36.0)
MCV: 86.5 fL (ref 78.0–100.0)
Platelets: 279 10*3/uL (ref 150–400)
RBC: 4.51 MIL/uL (ref 3.87–5.11)
RDW: 13.8 % (ref 11.5–15.5)
WBC: 4.2 10*3/uL (ref 4.0–10.5)

## 2017-07-22 LAB — I-STAT BETA HCG BLOOD, ED (MC, WL, AP ONLY)

## 2017-07-22 LAB — I-STAT TROPONIN, ED
Troponin i, poc: 0 ng/mL (ref 0.00–0.08)
Troponin i, poc: 0 ng/mL (ref 0.00–0.08)

## 2017-07-22 MED ORDER — METHOCARBAMOL 500 MG PO TABS: 500.0000 mg | ORAL_TABLET | Freq: Two times a day (BID) | ORAL | 0 refills | Status: DC

## 2017-07-22 MED ORDER — MORPHINE SULFATE (PF) 4 MG/ML IV SOLN
4.0000 mg | Freq: Once | INTRAVENOUS | Status: AC
  Administered 2017-07-22: 4 mg via INTRAVENOUS
  Filled 2017-07-22: qty 1

## 2017-07-22 MED ORDER — IBUPROFEN 400 MG PO TABS
600.0000 mg | ORAL_TABLET | Freq: Once | ORAL | Status: AC
  Administered 2017-07-22: 600 mg via ORAL
  Filled 2017-07-22: qty 1

## 2017-07-22 MED ORDER — METHOCARBAMOL 500 MG PO TABS
500.0000 mg | ORAL_TABLET | Freq: Once | ORAL | Status: AC
  Administered 2017-07-22: 500 mg via ORAL
  Filled 2017-07-22: qty 1

## 2017-07-22 MED ORDER — NAPROXEN 500 MG PO TABS: 500.0000 mg | ORAL_TABLET | Freq: Two times a day (BID) | ORAL | 0 refills | Status: DC

## 2017-07-22 MED ORDER — IOPAMIDOL (ISOVUE-370) INJECTION 76%
INTRAVENOUS | Status: AC
  Administered 2017-07-22: 100 mL
  Filled 2017-07-22: qty 100

## 2017-07-22 NOTE — ED Notes (Signed)
PA at bedside.

## 2017-07-22 NOTE — ED Notes (Signed)
Patient ambulated independently to the restroom.  Gait steady and even.

## 2017-07-22 NOTE — ED Triage Notes (Signed)
Pt states she has been having SOB/back pains for several weeks. While she was getting ready this morning she started having CP and some dizziness. Pt also reports some tingling into her left. Denies N/V

## 2017-07-22 NOTE — Discharge Instructions (Signed)
Read instructions below for reasons to return to the Emergency Department. It is recommended that your follow up with your Primary Care Doctor in regards to today's visit. If you do not have a doctor, use the resource guide listed below to help you find one.   Tests performed today include: An EKG of your heart A chest x-ray CT scan of the chest, abdomen and pelvis  Cardiac enzymes - a blood test for heart muscle damage x2  Blood counts and electrolytes Vital signs. See below for your results today.  Chest Pain (Nonspecific)  HOME CARE INSTRUCTIONS  For the next few days, avoid physical activities that bring on chest pain. Continue physical activities as directed.  Do not smoke cigarettes or drink alcohol until your symptoms are gone. If you do smoke, it is time to quit. You may receive instructions and counseling on how to stop smoking. Only take over-the-counter or prescription medicine for pain, discomfort, or fever as directed by your caregiver.  Follow your caregiver's suggestions for further testing if your chest pain does not go away.  Keep any follow-up appointments you made. If you do not go to an appointment, you could develop lasting (chronic) problems with pain. If there is any problem keeping an appointment, you must call to reschedule.  SEEK MEDICAL CARE IF:  You think you are having problems from the medicine you are taking. Read your medicine instructions carefully.  Your chest pain does not go away, even after treatment.  You develop a rash with blisters on your chest.  SEEK IMMEDIATE MEDICAL CARE IF:  You have increased chest pain or pain that spreads to your arm, neck, jaw, back, or belly (abdomen).  You develop shortness of breath, an increasing cough, or you are coughing up blood.  You have severe back or abdominal pain, feel sick to your stomach (nauseous) or throw up (vomit).  You develop severe weakness, fainting, or chills.  You have an oral temperature above 102 F  (38.9 C), not controlled by medicine.  THIS IS AN EMERGENCY. Do not wait to see if the pain will go away. Get medical help at once. Call your local emergency services (911 in [place] Do not drive yourself to the hospital. Additional Information:  Your vital signs today were: BP 105/74    Pulse 75    Temp 99.2 F (37.3 C) (Oral)    Resp 10    Ht 5\' 5"  (1.651 m)    Wt 81.6 kg (180 lb)    LMP 07/02/2017    SpO2 100%    BMI 29.95 kg/m  If your blood pressure (BP) was elevated above 135/85 this visit, please have this repeated by your doctor within one month. ---------------

## 2017-07-22 NOTE — ED Provider Notes (Signed)
[facility] EMERGENCY DEPARTMENT Provider Note   CSN: [id number] Arrival date & time: 07/22/17  2725     History   Chief Complaint Chief Complaint  Patient presents with  . Shortness of Breath  . Chest Pain  . Back Pain    HPI Jennifer Haas is a 42 y.o. female with a history of hypertension, atrial septal aneurysm who presents the emergency department today for chest pain, back pain, shortness of breath.  Patient reports that she has been having mid and upper back pain bilaterally over the last 1 month that she describes as burning in sensation.  She states she has been taking Excedrin for this without relief.  She notes that the pain is worse with movement and improved with rest.  Today when the patient was getting ready for work and is standing front of the mirror she had the acute onset of intense substernal chest pain with radiation to the upper and mid back that she describes as sharp and "extremely painful".  Pain did not radiate into her neck, jaw, either shoulder or abdomen.  She notes she had associated shortness of breath with this.  She reports her back pain was at her worst and rates it as a 10/10.  She states that the pain was extreme for 15-20 minutes before starting to improve when sitting down and is now resolved.  She reports that during that time she had some intermittent left arm tingling that is also now resolved.  She denied taking any medicines for this prior to arrival.  She had nausea, emesis or diaphoresis.  The pain was nonexertional, not positional and nonpleuritic in nature.  She states that she has never had chest pain like this before.  She does have a family history of early cardiac death including her father.  She is a never smoker.  She notes that she has had a previous echocardiogram and on my review in 2015 that showed a EF of 60-65% with normal systolic function, no wall motion abnormalities.  There was noted to be atrial septal aneurysm  without obvious PFO or ASD.  Is never had a stress test or cardiac catheterization. Denies risk factors for DVT/PE including exogenous estrogen use, recent surgery or travel, trauma, immobilization, smoking, previous blood clot, cough, hemoptysis, cancer, lower extremity pain or swelling, or family history of bleeding/clotting disorder.  No recent illnesses.  Denies fever or URI symptoms.  Denies vertigo, numbness/weakness of the extremities, facial droop, numbness of the face, visual changes, diplopia, hearing changes, difficulty with gait.   HPI  Past Medical History:  Diagnosis Date  . Gestational diabetes    With G4 diet controlled  . Hypertension   . Incompetence of cervix   . Prior pregnancy with fetal demise   . PVC's (premature ventricular contractions)     Patient Active Problem List   Diagnosis Date Noted  . Atrial septal aneurysm 09/19/2013  . Premature atrial contractions 09/19/2013  . HTN (hypertension) 09/19/2013  . Abdominal pain, LLQ 05/18/2011    Past Surgical History:  Procedure Laterality Date  . ABDOMINAL CERCLAGE  05/29/2011   Procedure: CERCLAGE ABDOMINAL;  Surgeon: Darlyn Chamber, MD;  Location: [facility];  Service: Gynecology;  Laterality: N/A;  need to use mersilene band  . ABDOMINAL CERCLAGE  10/20/2011   Procedure: CERCLAGE ABDOMINAL;  Surgeon: Cyril Mourning, MD;  Location: [facility];  Service: Gynecology;  Laterality: N/A;  Removal of abdominal cerclage  . CERVICAL CERCLAGE    .  LEEP    . TUBAL LIGATION      OB History    Gravida Para Term Preterm AB Living   7 7 1 6  0 4   SAB TAB Ectopic Multiple Live Births   0 0 0 0 6       Home Medications    Prior to Admission medications   Medication Sig Start Date End Date Taking? Authorizing Provider  cyclobenzaprine (FLEXERIL) 10 MG tablet Take 1 tablet (10 mg total) by mouth 2 (two) times daily as needed for muscle spasms. 03/03/15   Barrett, Lahoma Crocker, PA-C  hydrochlorothiazide (HYDRODIURIL) 25 MG tablet   09/07/13   [provider]  oseltamivir (TAMIFLU) 75 MG capsule Take 1 capsule (75 mg total) by mouth every 12 (twelve) hours. 07/22/14   Blanchie Dessert, MD    Family History Family History  Problem Relation Age of Onset  . Diabetes Mother   . Diabetes Father   . Heart disease Father   . Hypertension Father     Social History Social History   Tobacco Use  . Smoking status: Never Smoker  . Smokeless tobacco: Never Used  Substance Use Topics  . Alcohol use: No  . Drug use: No     Allergies   Patient has no known allergies.   Review of Systems Review of Systems  All other systems reviewed and are negative.    Physical Exam Updated Vital Signs BP 114/76   Pulse 80   Temp 99.2 F (37.3 C) (Oral)   Resp 18   Ht 5\' 5"  (1.651 m)   Wt 81.6 kg (180 lb)   LMP 07/02/2017   SpO2 100%   BMI 29.95 kg/m   Physical Exam  Constitutional: She appears well-developed and well-nourished.  HENT:  Head: Normocephalic and atraumatic.  Right Ear: External ear normal.  Left Ear: External ear normal.  Nose: Nose normal.  Mouth/Throat: Uvula is midline, oropharynx is clear and moist and mucous membranes are normal. No tonsillar exudate.  Eyes: Pupils are equal, round, and reactive to light. Right eye exhibits no discharge. Left eye exhibits no discharge. No scleral icterus.  Neck: Trachea normal. Neck supple. No JVD present. No spinous process tenderness present. Carotid bruit is not present. No neck rigidity. Normal range of motion present.  No meningismus.  No C-spine tenderness palpation or step-offs.  Normal range of motion.  Bilateral paraspinal tenderness palpation.  Negative Spurling's test  Cardiovascular: Normal rate, regular rhythm and intact distal pulses.  Pulses:      Radial pulses are 2+ on the right side, and 2+ on the left side.       Dorsalis pedis pulses are 2+ on the right side, and 2+ on the left side.       Posterior tibial pulses are 2+ on the right  side, and 2+ on the left side.  No lower extremity swelling or edema. Calves symmetric in size bilaterally.  No calf tenderness bilaterally.  Pulmonary/Chest: Effort normal and breath sounds normal. She exhibits tenderness.    Abdominal: Soft. Bowel sounds are normal. There is no tenderness. There is no rebound and no guarding.  Musculoskeletal: She exhibits no edema.  Diffuse tenderness to the cervical and thoracic paraspinal muscles.  No lumbar paraspinal tenderness palpation.  No C, T, L spinous tenderness palpation or step-offs.  Lymphadenopathy:    She has no cervical adenopathy.  Neurological: She is alert.  Speech clear. Follows commands. No facial droop. PERRLA. EOM grossly intact. CN III-XII  grossly intact. Grossly moves all extremities 4 without ataxia. Able and appropriate strength for age to upper and lower extremities bilaterally including grip strength, and ankle dorsiflexion/plantarflexion.  Patient able to raise legs off the bed against gravity bilaterally without difficulty.  Sensation to light touch intact to the upper and lower extremities bilaterally and equal.  Normal finger to nose bilaterally.  No pronator drift.  Skin: Skin is warm, dry and intact. Capillary refill takes less than 2 seconds. No rash noted. She is not diaphoretic.  Psychiatric: She has a normal mood and affect.  Nursing note and vitals reviewed.    ED Treatments / Results  Labs (all labs ordered are listed, but only abnormal results are displayed) Labs Reviewed  BASIC METABOLIC PANEL - Abnormal; Notable for the following components:      Result Value   Glucose, Bld 184 (*)    All other components within normal limits  CBC  I-STAT TROPONIN, ED  I-STAT BETA HCG BLOOD, ED ([facility], [facility], [facility] ONLY)  I-STAT TROPONIN, ED    EKG  EKG Interpretation None       Radiology Dg Chest 2 View  Result Date: 07/22/2017 CLINICAL DATA:  Shortness of breath and chest pain EXAM: CHEST - 2 VIEW COMPARISON:  None.  FINDINGS: Lungs are clear. Heart size and pulmonary vascularity are normal. No adenopathy. No bone lesions. No pneumothorax. IMPRESSION: No edema or consolidation. Electronically Signed   By: Lowella Grip III M.D.   On: 07/22/2017 10:34   Ct Angio Chest/abd/pel For Dissection W And/or W/wo  Result Date: 07/22/2017 CLINICAL DATA:  Back pain, chest pain extending into the left arm EXAM: CT ANGIOGRAPHY CHEST, ABDOMEN AND PELVIS TECHNIQUE: Multidetector CT imaging through the chest, abdomen and pelvis was performed using the standard protocol during bolus administration of intravenous contrast. Multiplanar reconstructed images and MIPs were obtained and reviewed to evaluate the vascular anatomy. CONTRAST:  144mL ISOVUE-370 IOPAMIDOL (ISOVUE-370) INJECTION 76% COMPARISON:  None. FINDINGS: CTA CHEST FINDINGS Cardiovascular: Preferential opacification of the thoracic aorta. No evidence of thoracic aortic aneurysm or dissection. Great vessels arising from the aortic arch are patent without focal stenosis or vasculitis. Normal heart size. No pericardial effusion. Central pulmonary arteries are well opacified. No evidence of pulmonary embolus. Mediastinum/Nodes: No enlarged mediastinal, hilar, or axillary lymph nodes. Thyroid gland, trachea, and esophagus demonstrate no significant findings. Lungs/Pleura: Lungs are clear. No pleural effusion or pneumothorax. Musculoskeletal: No chest wall abnormality. No acute or significant osseous findings. Review of the MIP images confirms the above findings. CTA ABDOMEN AND PELVIS FINDINGS VASCULAR Aorta: Normal caliber aorta without aneurysm, dissection, vasculitis or significant stenosis. Celiac: Smooth focal stenosis with mild deformity of origin of the celiac artery which can be seen with median arcuate ligament syndrome. Otherwise patent without evidence of aneurysm, dissection or vasculitis. SMA: Patent without evidence of aneurysm, dissection, vasculitis or significant  stenosis. Renals: Both renal arteries are patent without evidence of aneurysm, dissection, vasculitis, fibromuscular dysplasia or significant stenosis. IMA: Patent without evidence of aneurysm, dissection, vasculitis or significant stenosis. Inflow: Patent without evidence of aneurysm, dissection, vasculitis or significant stenosis. Minimal atherosclerotic plaque within common iliac arteries bilaterally. Veins: No obvious venous abnormality within the limitations of this arterial phase study. Review of the MIP images confirms the above findings. NON-VASCULAR Hepatobiliary: No focal liver abnormality is seen. No gallstones, gallbladder wall thickening, or biliary dilatation. Pancreas: Unremarkable. No pancreatic ductal dilatation or surrounding inflammatory changes. Spleen: Normal in size without focal abnormality. Adrenals/Urinary Tract: Adrenal glands  are unremarkable. Kidneys are normal, without renal calculi, focal lesion, or hydronephrosis. Bladder is unremarkable. Stomach/Bowel: Stomach is within normal limits. Appendix appears normal. No evidence of bowel wall thickening, distention, or inflammatory changes. Lymphatic: No lymphadenopathy. Reproductive: Uterus and bilateral adnexa are unremarkable. Other: No abdominal wall hernia or abnormality. No abdominopelvic ascites. Musculoskeletal: No acute or significant osseous findings. Mild osteoarthritis of bilateral sacroiliac joints. Review of the MIP images confirms the above findings. IMPRESSION: 1. No aortic aneurysm or dissection. 2. No central pulmonary embolus. 3. No acute abnormality of the chest, abdomen or pelvis. Electronically Signed   By: Kathreen Devoid   On: 07/22/2017 13:27    Procedures Procedures (including critical care time)  Medications Ordered in ED Medications  iopamidol (ISOVUE-370) 76 % injection (100 mLs  Contrast Given 07/22/17 1245)  morphine 4 MG/ML injection 4 mg (4 mg Intravenous Given 07/22/17 1324)  methocarbamol (ROBAXIN)  tablet 500 mg (500 mg Oral Given 07/22/17 1319)     Initial Impression / Assessment and Plan / ED Course  I have reviewed the triage vital signs and the nursing notes.  Pertinent labs & imaging results that were available during my care of the patient were reviewed by me and considered in my medical decision making (see chart for details).     42 year old female with history of hypertension presenting with acute onset of substernal chest pain that radiated to her upper and mid back this morning that she states lasted for 15-20 minutes before improving with rest.  There was some associated shortness of breath with this.  No nausea, emesis or diaphoresis.  Patient's pain was nonexertional, non-positional and nonpleuritic.  She does have a positive family history including her father.  She is a never smoker.  Most recent echocardiogram, 2015 shows EF of 60-65% with normal systolic function.  On exam the patient does have diffuse tenderness to the chest wall where her pain is located as well as the cervical and thoracic paraspinal muscles.  No C-spine or thoracic tenderness palpation or step-offs.  Negative Spurling's test.  Exam is not consistent with cervical radiculopathy.  No bowel or bladder incontinence.  No concern for cauda equina.  Screening labs done prior to my evaluation of the patient reassuring.  CBC unremarkable.  BMP reassuring. Initial Tn 0.00. No ischemic changes on ECG. Patient neurologic exam unremarkable.   Given acute onset of pain with radiation to the upper and lower back with reported tingling.  Will obtain CTA dissection study to evaluate pain.  Will cycle troponin at 3 hours.  CTA of the chest abdomen pelvis without evidence of aortic aneurysm or dissection.  No PE.  No acute abnormalities of the chest, abdomen or pelvis.  Repeat troponin 0.00.  Based on the heart score the patient is considered low risk.  Pain currently controlled in the emergency department.  Feels significant  relief after Robaxin. Patient has been advised to return to the ED if chest pain becomes exertional, associated with diaphoresis or nausea, radiates to left jaw/arm, worsens or becomes concerning in any way. Patient appears reliable for follow up and is agreeable to discharge. I advised the patient to follow-up with their primary care provider this week. I advised the patient to return to the emergency department with new or worsening symptoms or new concerns. The patient verbalized understanding and agreement with plan. Patient stable   Final Clinical Impressions(s) / ED Diagnoses   Final diagnoses:  Atypical chest pain  Musculoskeletal back pain  ED Discharge Orders        Ordered    methocarbamol (ROBAXIN) 500 MG tablet  2 times daily     07/22/17 1603    naproxen (NAPROSYN) 500 MG tablet  2 times daily     07/22/17 1603       Lorelle Gibbs 07/22/17 1618    Daleen Bo, MD 07/22/17 2000

## 2017-07-24 ENCOUNTER — Ambulatory Visit
Admission: RE | Admit: 2017-07-24 | Discharge: 2017-07-24 | Disposition: A | Discharge status: Home / Self Care | Payer: BLUE CROSS/BLUE SHIELD | Source: Ambulatory Visit | Attending: Obstetrics and Gynecology | Admitting: Obstetrics and Gynecology

## 2017-07-24 DIAGNOSIS — R928 Other abnormal and inconclusive findings on diagnostic imaging of breast: Secondary | ICD-10-CM

## 2017-12-14 ENCOUNTER — Other Ambulatory Visit: Payer: Self-pay | Admitting: Obstetrics and Gynecology

## 2017-12-14 DIAGNOSIS — R921 Mammographic calcification found on diagnostic imaging of breast: Secondary | ICD-10-CM

## 2017-12-17 ENCOUNTER — Ambulatory Visit (HOSPITAL_COMMUNITY)
Admission: EM | Admit: 2017-12-17 | Discharge: 2017-12-17 | Disposition: A | Discharge status: Home / Self Care | Payer: BLUE CROSS/BLUE SHIELD | Attending: Family Medicine | Admitting: Family Medicine

## 2017-12-17 ENCOUNTER — Other Ambulatory Visit: Payer: Self-pay

## 2017-12-17 ENCOUNTER — Encounter (HOSPITAL_COMMUNITY): Payer: Self-pay

## 2017-12-17 DIAGNOSIS — L0291 Cutaneous abscess, unspecified: Secondary | ICD-10-CM | POA: Diagnosis not present

## 2017-12-17 MED ORDER — SULFAMETHOXAZOLE-TRIMETHOPRIM 800-160 MG PO TABS: 1.0000 | ORAL_TABLET | Freq: Two times a day (BID) | ORAL | 0 refills | Status: AC

## 2017-12-17 MED ORDER — HYDROCODONE-ACETAMINOPHEN 5-325 MG PO TABS: 2.0000 | ORAL_TABLET | ORAL | 0 refills | Status: DC | PRN

## 2017-12-17 NOTE — ED Provider Notes (Signed)
[facility]    CSN: [id number] Arrival date & time: 12/17/17  1619     History   Chief Complaint No chief complaint on file.   HPI Jennifer Haas is a 42 y.o. female.   Patient is a 42 year old female past medical history of hypertension.  She presents with 1 week and a half of worsening abscess to right side area near the bra line.  It has become more painful and swollen.  She denies any drainage from the area but has been using some cream from the pharmacy.  She denies any fever, chills, body aches, fatigue or night sweats.  She denies any history of MRSA or abscesses.       Past Medical History:  Diagnosis Date  . Gestational diabetes    With G4 diet controlled  . Hypertension   . Incompetence of cervix   . Prior pregnancy with fetal demise   . PVC's (premature ventricular contractions)     Patient Active Problem List   Diagnosis Date Noted  . Atrial septal aneurysm 09/19/2013  . Premature atrial contractions 09/19/2013  . HTN (hypertension) 09/19/2013  . Abdominal pain, LLQ 05/18/2011    Past Surgical History:  Procedure Laterality Date  . ABDOMINAL CERCLAGE  05/29/2011   Procedure: CERCLAGE ABDOMINAL;  Surgeon: Darlyn Chamber, MD;  Location: [facility];  Service: Gynecology;  Laterality: N/A;  need to use mersilene band  . ABDOMINAL CERCLAGE  10/20/2011   Procedure: CERCLAGE ABDOMINAL;  Surgeon: Cyril Mourning, MD;  Location: [facility];  Service: Gynecology;  Laterality: N/A;  Removal of abdominal cerclage  . CERVICAL CERCLAGE    . LEEP    . TUBAL LIGATION      OB History    Gravida  7   Para  7   Term  1   Preterm  6   AB  0   Living  4     SAB  0   TAB  0   Ectopic  0   Multiple  0   Live Births  6            Home Medications    Prior to Admission medications   Medication Sig Start Date End Date Taking? Authorizing Provider  cyclobenzaprine (FLEXERIL) 10 MG tablet Take 1 tablet (10 mg total) by mouth 2 (two) times  daily as needed for muscle spasms. Patient not taking: Reported on 12/17/2017 03/03/15   Barrett, Lahoma Crocker, PA-C  hydrochlorothiazide (HYDRODIURIL) 25 MG tablet  09/07/13   [provider]  HYDROcodone-acetaminophen (NORCO/VICODIN) 5-325 MG tablet Take 2 tablets by mouth every 4 (four) hours as needed. 12/17/17   Loura Halt A, NP  methocarbamol (ROBAXIN) 500 MG tablet Take 1 tablet (500 mg total) by mouth 2 (two) times daily. Patient not taking: Reported on 12/17/2017 07/22/17   Maczis, Barth Kirks, PA-C  naproxen (NAPROSYN) 500 MG tablet Take 1 tablet (500 mg total) by mouth 2 (two) times daily. Patient not taking: Reported on 12/17/2017 07/22/17   Maczis, Barth Kirks, PA-C  oseltamivir (TAMIFLU) 75 MG capsule Take 1 capsule (75 mg total) by mouth every 12 (twelve) hours. Patient not taking: Reported on 12/17/2017 07/22/14   Blanchie Dessert, MD  sulfamethoxazole-trimethoprim (BACTRIM DS,SEPTRA DS) 800-160 MG tablet Take 1 tablet by mouth 2 (two) times daily for 7 days. 12/17/17 12/24/17  Orvan July, NP    Family History Family History  Problem Relation Age of Onset  . Diabetes Mother   .  Diabetes Father   . Heart disease Father   . Hypertension Father     Social History Social History   Tobacco Use  . Smoking status: Never Smoker  . Smokeless tobacco: Never Used  Substance Use Topics  . Alcohol use: No  . Drug use: No     Allergies   Patient has no known allergies.   Review of Systems Review of Systems  Constitutional: Negative for fatigue and fever.  Skin: Negative for rash.  Allergic/Immunologic: Negative for immunocompromised state.  Neurological: Negative for weakness.  All other systems reviewed and are negative.    Physical Exam Triage Vital Signs ED Triage Vitals  Enc Vitals Group     BP 12/17/17 1640 122/80     Pulse Rate 12/17/17 1640 94     Resp 12/17/17 1640 18     Temp 12/17/17 1640 97.9 F (36.6 C)     Temp src --      SpO2 12/17/17 1640 100 %      Weight 12/17/17 1642 167 lb (75.8 kg)     Height --      Head Circumference --      Peak Flow --      Pain Score 12/17/17 1642 7     Pain Loc --      Pain Edu? --      Excl. in [place]? --    No data found.  Updated Vital Signs BP 122/80   Pulse 94   Temp 97.9 F (36.6 C)   Resp 18   Wt 167 lb (75.8 kg)   LMP 12/03/2017   SpO2 100%   BMI 27.79 kg/m   Visual Acuity Right Eye Distance:   Left Eye Distance:   Bilateral Distance:    Right Eye Near:   Left Eye Near:    Bilateral Near:     Physical Exam  Constitutional: She appears well-developed and well-nourished. No distress.  HENT:  Head: Normocephalic and atraumatic.  Eyes: Conjunctivae are normal.  Neck: Normal range of motion.  Pulmonary/Chest: Effort normal.  Musculoskeletal: Normal range of motion.  Neurological: She is alert.  Skin: Skin is warm and dry.  4 x 2 cm erythematous , indurated abscess to right flank  near her bra line. No  Fluctuance palpated.  Minimal drainage to Band-Aid when taken off. Very tender to touch.   Psychiatric: She has a normal mood and affect.  Nursing note and vitals reviewed.    UC Treatments / Results  Labs (all labs ordered are listed, but only abnormal results are displayed) Labs Reviewed - No data to display  EKG None  Radiology No results found.   Procedures Procedures (including critical care time)  Medications Ordered in UC Medications - No data to display  Initial Impression / Assessment and Plan / UC Course  I have reviewed the triage vital signs and the nursing notes.  Pertinent labs & imaging results that were available during my care of the patient were reviewed by me and considered in my medical decision making (see chart for details).     No fluctuant area felt. No indication for I & D today. Will try antibiotics and warm compresses to see if this helps. Told to return in 2 days for recheck or sooner if getting worse.   Pt understanding and agreeable  to plan.  Final Clinical Impressions(s) / UC Diagnoses   Final diagnoses:  Abscess     Discharge Instructions     It was  nice meeting you!!  Will try antibiotics and warm compresses to see if this helps.  I will give you some pain pills.   Please return in 2 days for recheck or sooner if worsening.     ED Prescriptions    Medication Sig Dispense Auth. Provider   sulfamethoxazole-trimethoprim (BACTRIM DS,SEPTRA DS) 800-160 MG tablet Take 1 tablet by mouth 2 (two) times daily for 7 days. 14 tablet Daleyza Gadomski A, NP   HYDROcodone-acetaminophen (NORCO/VICODIN) 5-325 MG tablet Take 2 tablets by mouth every 4 (four) hours as needed. 10 tablet Loura Halt A, NP     Controlled Substance Prescriptions [place] Controlled Substance Registry consulted? Not Applicable   Orvan July, NP 12/17/17 1735

## 2017-12-17 NOTE — Discharge Instructions (Signed)
It was nice meeting you!!  Will try antibiotics and warm compresses to see if this helps.  I will give you some pain pills.   Please return in 2 days for recheck or sooner if worsening.

## 2017-12-21 ENCOUNTER — Ambulatory Visit (HOSPITAL_COMMUNITY)
Admission: EM | Admit: 2017-12-21 | Discharge: 2017-12-21 | Disposition: A | Discharge status: Home / Self Care | Payer: BLUE CROSS/BLUE SHIELD | Attending: Family Medicine | Admitting: Family Medicine

## 2017-12-21 ENCOUNTER — Encounter (HOSPITAL_COMMUNITY): Payer: Self-pay | Admitting: Emergency Medicine

## 2017-12-21 DIAGNOSIS — L02213 Cutaneous abscess of chest wall: Secondary | ICD-10-CM | POA: Diagnosis not present

## 2017-12-21 MED ORDER — HYDROCODONE-ACETAMINOPHEN 5-325 MG PO TABS: 2.0000 | ORAL_TABLET | Freq: Four times a day (QID) | ORAL | 0 refills | Status: DC | PRN

## 2017-12-21 NOTE — Discharge Instructions (Signed)
Be aware, pain medications may cause drowsiness. Please do not drive, operate heavy machinery or make important decisions while on this medication, it can cloud your judgement.  

## 2017-12-21 NOTE — ED Provider Notes (Signed)
[place]   [id number] 12/21/17 Arrival Time: 4944  ASSESSMENT & PLAN:  1. Abscess of chest wall     Incision and Drainage Procedure Note  Anesthesia: 2% plain lidocaine  Procedure Details  The procedure, risks and complications have been discussed in detail (including, but not limited to pain and bleeding) with the patient.  The skin induration was prepped and draped in the usual fashion. After adequate local anesthesia, I&D with a #11 blade was performed on the right side/chest wall. Purulent drainage: present. Packing placed.  EBL: minmal  Drains: none  Condition: Tolerated procedure well  Complications: none.  Meds ordered this encounter  Medications  . HYDROcodone-acetaminophen (NORCO/VICODIN) 5-325 MG tablet    Sig: Take 2 tablets by mouth every 6 (six) hours as needed.    Dispense:  6 tablet    Refill:  0    Finish all antibiotics. Pain medication sedation precautions.  Follow-up Information    [facility].   Specialty:  Urgent Care Why:  Return in 48 hours for wound check and packing removal. Contact information: [place] [phone]         Reviewed expectations re: course of current medical issues. Questions answered. Outlined signs and symptoms indicating need for more acute intervention. Patient verbalized understanding. After Visit Summary given.   SUBJECTIVE:  Jennifer Haas is a 42 y.o. female who presents with an abscess of her R lateral chest wall. Onset gradual, approximately several days ago. Has been on antibiotic for approx 4 days. No significant change. No drainage. Now more painful. No fever reported.  ROS: As per HPI.  OBJECTIVE:  Vitals:   12/21/17 1437  BP: 128/82  Pulse: 88  Resp: 18  Temp: 99.7 F (37.6 C)  TempSrc: Oral  SpO2: 99%     General appearance: alert; no distress Skin: 4x2 cm induration of her R lateral chest  wall; firm; tender to touch; no active drainage Psychological: alert and cooperative; normal mood and affect  No Known Allergies  Past Medical History:  Diagnosis Date  . Gestational diabetes    With G4 diet controlled  . Hypertension   . Incompetence of cervix   . Prior pregnancy with fetal demise   . PVC's (premature ventricular contractions)    Social History   Socioeconomic History  . Marital status: Married    Spouse name: Not on file  . Number of children: Not on file  . Years of education: Not on file  . Highest education level: Not on file  Occupational History  . Not on file  Social Needs  . Financial resource strain: Not on file  . Food insecurity:    Worry: Not on file    Inability: Not on file  . Transportation needs:    Medical: Not on file    Non-medical: Not on file  Tobacco Use  . Smoking status: Never Smoker  . Smokeless tobacco: Never Used  Substance and Sexual Activity  . Alcohol use: No  . Drug use: No  . Sexual activity: Yes    Birth control/protection: Surgical  Lifestyle  . Physical activity:    Days per week: Not on file    Minutes per session: Not on file  . Stress: Not on file  Relationships  . Social connections:    Talks on phone: Not on file    Gets together: Not on file    Attends religious service: Not  on file    Active member of club or organization: Not on file    Attends meetings of clubs or organizations: Not on file    Relationship status: Not on file  Other Topics Concern  . Not on file  Social History Narrative  . Not on file   Family History  Problem Relation Age of Onset  . Diabetes Mother   . Diabetes Father   . Heart disease Father   . Hypertension Father    Past Surgical History:  Procedure Laterality Date  . ABDOMINAL CERCLAGE  05/29/2011   Procedure: CERCLAGE ABDOMINAL;  Surgeon: Darlyn Chamber, MD;  Location: [facility];  Service: Gynecology;  Laterality: N/A;  need to use mersilene band  . ABDOMINAL CERCLAGE   10/20/2011   Procedure: CERCLAGE ABDOMINAL;  Surgeon: Cyril Mourning, MD;  Location: [facility];  Service: Gynecology;  Laterality: N/A;  Removal of abdominal cerclage  . CERVICAL CERCLAGE    . LEEP    . TUBAL LIGATION             Vanessa Kick, MD 12/21/17 1504

## 2017-12-21 NOTE — ED Triage Notes (Signed)
Pt here for recheck of abscess to right axillary area

## 2017-12-24 ENCOUNTER — Encounter (HOSPITAL_COMMUNITY): Payer: Self-pay

## 2017-12-24 ENCOUNTER — Ambulatory Visit (HOSPITAL_COMMUNITY)
Admission: EM | Admit: 2017-12-24 | Discharge: 2017-12-24 | Disposition: A | Discharge status: Home / Self Care | Payer: BLUE CROSS/BLUE SHIELD | Attending: Emergency Medicine | Admitting: Emergency Medicine

## 2017-12-24 ENCOUNTER — Other Ambulatory Visit: Payer: Self-pay

## 2017-12-24 DIAGNOSIS — Z09 Encounter for follow-up examination after completed treatment for conditions other than malignant neoplasm: Secondary | ICD-10-CM

## 2017-12-24 DIAGNOSIS — L02213 Cutaneous abscess of chest wall: Secondary | ICD-10-CM

## 2017-12-24 NOTE — Discharge Instructions (Signed)
Packing removed Clean dressing applied Apply warm compresses 3-4x daily for 10-15 minutes, or perform epsom salt water soaks Wash site daily with warm water and mild soap Keep covered to avoid friction Follow up on 12/28/17 for wound recheck Return sooner or go to the ED if you have any new or worsening symptoms such as worsening pain, increased redness, swelling or drainage, or systemic symptoms of infection like fever, chills, nausea, vomiting, etc..

## 2017-12-24 NOTE — ED Provider Notes (Signed)
[place]   [id number] 12/24/17 Arrival Time: 9528  CC: Wound check  SUBJECTIVE:  Jennifer Haas is a 42 y.o. female who presents post incision and drainage on 12/21/17 to right lateral chest.  Presents today for wound recheck and packing removal.  Has been taking antibiotic as prescribed.  Finished course today.  Reports improvement in symptoms.  Complains of drainage and pain at site.  Denies fever, chills, nausea, vomiting, SOB, chest pain, abdominal pain, changes in bowel or bladder function.    ROS: As per HPI.  Past Medical History:  Diagnosis Date  . Gestational diabetes    With G4 diet controlled  . Hypertension   . Incompetence of cervix   . Prior pregnancy with fetal demise   . PVC's (premature ventricular contractions)    Past Surgical History:  Procedure Laterality Date  . ABDOMINAL CERCLAGE  05/29/2011   Procedure: CERCLAGE ABDOMINAL;  Surgeon: Darlyn Chamber, MD;  Location: [facility];  Service: Gynecology;  Laterality: N/A;  need to use mersilene band  . ABDOMINAL CERCLAGE  10/20/2011   Procedure: CERCLAGE ABDOMINAL;  Surgeon: Cyril Mourning, MD;  Location: [facility];  Service: Gynecology;  Laterality: N/A;  Removal of abdominal cerclage  . CERVICAL CERCLAGE    . LEEP    . TUBAL LIGATION     No Known Allergies No current facility-administered medications on file prior to encounter.    Current Outpatient Medications on File Prior to Encounter  Medication Sig Dispense Refill  . cyclobenzaprine (FLEXERIL) 10 MG tablet Take 1 tablet (10 mg total) by mouth 2 (two) times daily as needed for muscle spasms. (Patient not taking: Reported on 12/17/2017) 20 tablet 0  . hydrochlorothiazide (HYDRODIURIL) 25 MG tablet     . HYDROcodone-acetaminophen (NORCO/VICODIN) 5-325 MG tablet Take 2 tablets by mouth every 6 (six) hours as needed. 6 tablet 0  . methocarbamol (ROBAXIN) 500 MG tablet Take 1 tablet (500 mg total) by mouth 2 (two) times daily. (Patient not taking:  Reported on 12/17/2017) 20 tablet 0  . naproxen (NAPROSYN) 500 MG tablet Take 1 tablet (500 mg total) by mouth 2 (two) times daily. (Patient not taking: Reported on 12/17/2017) 30 tablet 0  . oseltamivir (TAMIFLU) 75 MG capsule Take 1 capsule (75 mg total) by mouth every 12 (twelve) hours. (Patient not taking: Reported on 12/17/2017) 10 capsule 0  . sulfamethoxazole-trimethoprim (BACTRIM DS,SEPTRA DS) 800-160 MG tablet Take 1 tablet by mouth 2 (two) times daily for 7 days. 14 tablet 0   Social History   Socioeconomic History  . Marital status: Married    Spouse name: Not on file  . Number of children: Not on file  . Years of education: Not on file  . Highest education level: Not on file  Occupational History  . Not on file  Social Needs  . Financial resource strain: Not on file  . Food insecurity:    Worry: Not on file    Inability: Not on file  . Transportation needs:    Medical: Not on file    Non-medical: Not on file  Tobacco Use  . Smoking status: Never Smoker  . Smokeless tobacco: Never Used  Substance and Sexual Activity  . Alcohol use: No  . Drug use: No  . Sexual activity: Yes    Birth control/protection: Surgical  Lifestyle  . Physical activity:    Days per week: Not on file    Minutes per session: Not on file  . Stress: Not  on file  Relationships  . Social connections:    Talks on phone: Not on file    Gets together: Not on file    Attends religious service: Not on file    Active member of club or organization: Not on file    Attends meetings of clubs or organizations: Not on file    Relationship status: Not on file  . Intimate partner violence:    Fear of current or ex partner: Not on file    Emotionally abused: Not on file    Physically abused: Not on file    Forced sexual activity: Not on file  Other Topics Concern  . Not on file  Social History Narrative  . Not on file   Family History  Problem Relation Age of Onset  . Diabetes Mother   . Diabetes  Father   . Heart disease Father   . Hypertension Father     OBJECTIVE: Vitals:   12/24/17 0815 12/24/17 0819  BP: 116/81   Pulse: 95   Resp: 16   Temp: 98.5 F (36.9 C)   TempSrc: Oral   SpO2: 96%   Weight:  168 lb (76.2 kg)    General appearance: alert; no distress Lungs: clear to auscultation bilaterally Heart: regular rate and rhythm.  Radial pulse 2+ bilaterally Extremities: no edema Skin: 2 cm incision localized to right lateral chest, packing removed; bloody purulent drainage evident; no obvious erythema around incision site; mildly tender to palpation  Psychological: alert and cooperative; normal mood and affect  ASSESSMENT & PLAN:  1. Encounter for recheck of abscess following incision and drainage    Discussed patient case with Dr. Mannie Stabile.  Recommended follow up on Monday or sooner for reevaluation and management.    No orders of the defined types were placed in this encounter.  Packing removed Clean dressing applied Apply warm compresses 3-4x daily for 10-15 minutes, or perform epsom salt water soaks Wash site daily with warm water and mild soap Keep covered to avoid friction Follow up on 12/28/17 for wound recheck Return sooner or go to the ED if you have any new or worsening symptoms such as worsening pain, increased redness, swelling or drainage, or systemic symptoms of infection like fever, chills, nausea, vomiting, etc..  Reviewed expectations re: course of current medical issues. Questions answered. Outlined signs and symptoms indicating need for more acute intervention. Patient verbalized understanding. After Visit Summary given.   Lestine Box, PA-C 12/24/17 1003

## 2017-12-24 NOTE — ED Triage Notes (Signed)
Pt here for a wound check on her right side. Pt needs the packing changed.

## 2018-05-05 HISTORY — PX: BREAST EXCISIONAL BIOPSY: SUR124

## 2018-06-15 ENCOUNTER — Emergency Department (HOSPITAL_COMMUNITY)
Admission: EM | Admit: 2018-06-15 | Discharge: 2018-06-16 | Disposition: A | Discharge status: Other Acute Inpatient Hospital | Payer: Medicaid Other | Attending: Emergency Medicine | Admitting: Emergency Medicine

## 2018-06-15 ENCOUNTER — Other Ambulatory Visit: Payer: Self-pay

## 2018-06-15 ENCOUNTER — Telehealth: Payer: Self-pay | Admitting: *Deleted

## 2018-06-15 DIAGNOSIS — T1491XA Suicide attempt, initial encounter: Secondary | ICD-10-CM | POA: Diagnosis present

## 2018-06-15 DIAGNOSIS — T43012A Poisoning by tricyclic antidepressants, intentional self-harm, initial encounter: Secondary | ICD-10-CM | POA: Diagnosis present

## 2018-06-15 DIAGNOSIS — T43012D Poisoning by tricyclic antidepressants, intentional self-harm, subsequent encounter: Secondary | ICD-10-CM | POA: Diagnosis not present

## 2018-06-15 DIAGNOSIS — F322 Major depressive disorder, single episode, severe without psychotic features: Secondary | ICD-10-CM | POA: Diagnosis not present

## 2018-06-15 DIAGNOSIS — T50902A Poisoning by unspecified drugs, medicaments and biological substances, intentional self-harm, initial encounter: Secondary | ICD-10-CM

## 2018-06-15 DIAGNOSIS — I10 Essential (primary) hypertension: Secondary | ICD-10-CM | POA: Diagnosis not present

## 2018-06-15 DIAGNOSIS — Z79899 Other long term (current) drug therapy: Secondary | ICD-10-CM | POA: Diagnosis not present

## 2018-06-15 DIAGNOSIS — F329 Major depressive disorder, single episode, unspecified: Secondary | ICD-10-CM | POA: Diagnosis not present

## 2018-06-15 LAB — COMPREHENSIVE METABOLIC PANEL
ALBUMIN: 4.3 g/dL (ref 3.5–5.0)
ALT: 13 U/L (ref 0–44)
AST: 33 U/L (ref 15–41)
Alkaline Phosphatase: 61 U/L (ref 38–126)
Anion gap: 9 (ref 5–15)
BILIRUBIN TOTAL: 1.2 mg/dL (ref 0.3–1.2)
BUN: 16 mg/dL (ref 6–20)
CO2: 24 mmol/L (ref 22–32)
Calcium: 9.5 mg/dL (ref 8.9–10.3)
Chloride: 104 mmol/L (ref 98–111)
Creatinine, Ser: 0.7 mg/dL (ref 0.44–1.00)
GFR calc Af Amer: >60 mL/min (ref >60)
GLUCOSE: 96 mg/dL (ref 70–99)
Potassium: 4.8 mmol/L (ref 3.5–5.1)
Sodium: 137 mmol/L (ref 135–145)
TOTAL PROTEIN: 8.2 g/dL — AB (ref 6.5–8.1)

## 2018-06-15 LAB — RAPID URINE DRUG SCREEN, HOSP PERFORMED
Amphetamines: NOT DETECTED
BARBITURATES: NOT DETECTED
Benzodiazepines: NOT DETECTED
Cocaine: NOT DETECTED
Opiates: NOT DETECTED
Tetrahydrocannabinol: NOT DETECTED

## 2018-06-15 LAB — POCT I-STAT EG7
Acid-Base Excess: 3 mmol/L — ABNORMAL HIGH (ref 0.0–2.0)
Bicarbonate: 27.9 mmol/L (ref 20.0–28.0)
Calcium, Ion: 1.18 mmol/L (ref 1.15–1.40)
HEMATOCRIT: 41 % (ref 36.0–46.0)
Hemoglobin: 13.9 g/dL (ref 12.0–15.0)
O2 Saturation: 94 %
PCO2 VEN: 40.8 mmHg — AB (ref 44.0–60.0)
POTASSIUM: 5.2 mmol/L — AB (ref 3.5–5.1)
SODIUM: 138 mmol/L (ref 135–145)
TCO2: 29 mmol/L (ref 22–32)
pH, Ven: 7.443 — ABNORMAL HIGH (ref 7.250–7.430)
pO2, Ven: 70 mmHg — ABNORMAL HIGH (ref 32.0–45.0)

## 2018-06-15 LAB — URINALYSIS, ROUTINE W REFLEX MICROSCOPIC
BILIRUBIN URINE: NEGATIVE
Bacteria, UA: NONE SEEN
GLUCOSE, UA: NEGATIVE mg/dL
Ketones, ur: NEGATIVE mg/dL
Leukocytes,Ua: NEGATIVE
NITRITE: NEGATIVE
Protein, ur: NEGATIVE mg/dL
Specific Gravity, Urine: 1.006 (ref 1.005–1.030)
pH: 6 (ref 5.0–8.0)

## 2018-06-15 LAB — CBG MONITORING, ED: Glucose-Capillary: 88 mg/dL (ref 70–99)

## 2018-06-15 LAB — CBC WITH DIFFERENTIAL/PLATELET
ABS IMMATURE GRANULOCYTES: 0.03 10*3/uL (ref 0.00–0.07)
BASOS ABS: 0 10*3/uL (ref 0.0–0.1)
BASOS PCT: 0 %
EOS PCT: 1 %
Eosinophils Absolute: 0.1 10*3/uL (ref 0.0–0.5)
HCT: 41 % (ref 36.0–46.0)
HEMOGLOBIN: 13.3 g/dL (ref 12.0–15.0)
Immature Granulocytes: 0 %
LYMPHS PCT: 30 %
Lymphs Abs: 2.3 10*3/uL (ref 0.7–4.0)
MCH: 28.2 pg (ref 26.0–34.0)
MCHC: 32.4 g/dL (ref 30.0–36.0)
MCV: 86.9 fL (ref 80.0–100.0)
MONO ABS: 0.6 10*3/uL (ref 0.1–1.0)
Monocytes Relative: 8 %
NEUTROS ABS: 4.5 10*3/uL (ref 1.7–7.7)
NRBC: 0 % (ref 0.0–0.2)
Neutrophils Relative %: 61 %
Platelets: 312 10*3/uL (ref 150–400)
RBC: 4.72 MIL/uL (ref 3.87–5.11)
RDW: 14.2 % (ref 11.5–15.5)
WBC: 7.5 10*3/uL (ref 4.0–10.5)

## 2018-06-15 LAB — I-STAT BETA HCG BLOOD, ED (MC, WL, AP ONLY)

## 2018-06-15 LAB — ETHANOL

## 2018-06-15 LAB — ACETAMINOPHEN LEVEL

## 2018-06-15 LAB — I-STAT CREATININE, ED: Creatinine, Ser: 0.5 mg/dL (ref 0.44–1.00)

## 2018-06-15 LAB — SALICYLATE LEVEL: Salicylate Lvl: <7 mg/dL (ref 2.8–30.0)

## 2018-06-15 MED ORDER — DIPHENHYDRAMINE HCL 25 MG PO CAPS
50.0000 mg | ORAL_CAPSULE | Freq: Once | ORAL | Status: AC
Start: 2018-06-15 — End: 2018-06-15
  Administered 2018-06-15: 50 mg via ORAL
  Filled 2018-06-15: qty 2

## 2018-06-15 NOTE — ED Notes (Signed)
Spoke

## 2018-06-15 NOTE — ED Notes (Signed)
Bed: QH22 Expected date:  Expected time:  Means of arrival:  Comments: EMS-OD-room 18

## 2018-06-15 NOTE — Telephone Encounter (Signed)
Telephoned patient, left a message to return a call to [name], regarding a mammo scholarship and appointment.Marland Kitchen

## 2018-06-15 NOTE — ED Triage Notes (Signed)
Per EMS:  Pt from home.  Overdose amitriptyline 19 pills 10 mg each with intent to hut herself.  No IVC from family.  Pt A&Ox4 and is drowsy.

## 2018-06-15 NOTE — ED Provider Notes (Signed)
Medical screening examination/treatment/procedure(s) were conducted as a shared visit with non-physician practitioner(s) and myself.  I personally evaluated the patient during the encounter. Briefly, the patient is a 43 y.o. female with hx of HTN presents to ED after SI attempt after taking 19-20 pils of amytriptyline of 10 mg tablets. Patient asymptomatic on arrival. Normal vitals and no fever, no clonus, normal neuro exam. Patient denies current SI but IVC placed when patient tried to leave. Patient depressed. Poison control callled, recommend 8 hour obs prior to being medically cleared. EKG with NSR, normal intervals. Lab work obtained so no significant findings. Tylenol level normal. Repeat EKGs wnl. Patient observed for 8 hours and cleared medically. Was given benadryl for some mild dystonia but no seizures and patient overall stable throughout care. Psych recommending inpt care.  This chart was dictated using voice recognition software.  Despite best efforts to proofread,  errors can occur which can change the documentation meaning.     EKG Interpretation  Date/Time:  Tuesday June 15 2018 15:35:38 EST Ventricular Rate:  116 PR Interval:    QRS Duration: 80 QT Interval:  311 QTC Calculation: 432 R Axis:   -9 Text Interpretation:  Sinus tachycardia Ventricular premature complex Aberrant complex Consider anterior infarct Confirmed by Lennice Sites [phone]) on 06/15/2018 4:33:57 PM           Lennice Sites, DO 06/16/18 7510

## 2018-06-15 NOTE — BH Assessment (Addendum)
Assessment Note  Jennifer Haas is an 43 y.o. female.  -Clinician reviewed note from [place], [place].  Jennifer Haas is a 43 y.o. female who presents emergency department after intentional overdose.  Is a history of hypertension and atrial septal aneurysm.  Patient took about 20 amitriptyline 10 mg tablets around 1 PM today.  Patient states that she was feeling extremely overwhelmed because of financial difficulties she is having.  She has no previous history of intentional overdose, psychiatric illness or psychiatric hospitalization.  The patient also takes lisinopril for hypertension.  She did not take any other medications.  She does not use other drugs or alcohol.  She states that this is very out of character for her and she does not know why she acted that way earlier.  She is here voluntarily.  Patient denies hallucinations or homicidality.  She feels sleepy but has no other complaints.  Patient says that she thinks she may have taken around 10 of her amitriptyline.  She says that she has been having financial problems which have made her upset.  Patient says she took more of her medication because "I did not want to be bothered."  When asked how EMS got involved she said her oldest son had called her and she was "going in and out."  He then called his aunt (pt's sister) and she then called EMS.  EMS had said that patient had taken about 19 of the amitriptyline.  Patient says that she had no intention to kill herself.  Patient is denying wanting to kill herself.  She wanted to leave so the EDP had to initiate IVC papers.  Patient denies any previous suicide attempts.  Patient denies any HI or A/V hallucinations also.  Patient says that she did drink a glass of wine once every week or two.  Patient denies other drug use.  Patient is sleepy during most of the assessment.  She does rouse easily however.  She says that she lives with her husband and gives permission for this clinician to talk  to her husband, Jennifer Haas.  Patient did call husband.  He said that the family is surprised by patient's actions.  He said she has not been talking about killing herself and has no previous hx of this type of behavior.  He did endorse her having financial problems.  He did say that this afternoon she told him, "I fucking can't take it anymore."    Patient has no previous inpatient or outpatient care.    -Clinician discussed patient care with Jennifer Clan, PA who recommends inpatient care.  Patient is under IVC initiated by EDP.  Clinician did talk to Jennifer Mail, PA and let her know that patient needs inpatient care, to which she agreed.    Diagnosis: F32.2 MDD single episode severe  Past Medical History:  Past Medical History:  Diagnosis Date  . Gestational diabetes    With G4 diet controlled  . Hypertension   . Incompetence of cervix   . Prior pregnancy with fetal demise   . PVC's (premature ventricular contractions)     Past Surgical History:  Procedure Laterality Date  . ABDOMINAL CERCLAGE  05/29/2011   Procedure: CERCLAGE ABDOMINAL;  Surgeon: Darlyn Chamber, MD;  Location: [facility];  Service: Gynecology;  Laterality: N/A;  need to use mersilene band  . ABDOMINAL CERCLAGE  10/20/2011   Procedure: CERCLAGE ABDOMINAL;  Surgeon: Cyril Mourning, MD;  Location: [facility];  Service: Gynecology;  Laterality:  N/A;  Removal of abdominal cerclage  . CERVICAL CERCLAGE    . LEEP    . TUBAL LIGATION      Family History:  Family History  Problem Relation Age of Onset  . Diabetes Mother   . Diabetes Father   . Heart disease Father   . Hypertension Father     Social History:  reports that she has never smoked. She has never used smokeless tobacco. She reports that she does not drink alcohol or use drugs.  Additional Social History:  Alcohol / Drug Use Pain Medications: None Prescriptions: Lisinopril, Amitriptyline Over the Counter: None History of alcohol / drug use?: No  history of alcohol / drug abuse  CIWA: CIWA-Ar BP: 116/86 Pulse Rate: (!) 102 COWS:    Allergies: No Known Allergies  Home Medications: (Not in a hospital admission)   OB/GYN Status:  No LMP recorded.  General Assessment Data Location of Assessment: [facility] TTS Assessment: In system Is this a Tele or Face-to-Face Assessment?: Face-to-Face Is this an Initial Assessment or a Re-assessment for this encounter?: Initial Assessment Patient Accompanied by:: N/A Language Other than English: No Living Arrangements: Other (Comment)(Pt lives w/ her husband and her two boys.) What gender do you identify as?: Female Marital status: Married Pharmacist, community name: [place] Pregnancy Status: No Living Arrangements: Spouse/significant other Can pt return to current living arrangement?: Yes Admission Status: Voluntary Is patient capable of signing voluntary admission?: Yes Referral Source: Self/Family/Friend(Sister called EMS.) Insurance type: self pay     Crisis Care Plan Living Arrangements: Spouse/significant other Name of Psychiatrist: None Name of Therapist: None  Education Status Is patient currently in school?: No Is the patient employed, unemployed or receiving disability?: Employed  Risk to self with the past 6 months Suicidal Ideation: Yes-Currently Present(Pt is denying that this was a suicide attempt.) Has patient been a risk to self within the past 6 months prior to admission? : No Suicidal Intent: No(Pt is denying suicidal intention.) Has patient had any suicidal intent within the past 6 months prior to admission? : No Is patient at risk for suicide?: Yes Suicidal Plan?: No(Pt denies that this was a plan to kill self.) Has patient had any suicidal plan within the past 6 months prior to admission? : No Access to Means: Yes Specify Access to Suicidal Means: Medications What has been your use of drugs/alcohol within the last 12 months?: N/A Previous Attempts/Gestures: No How many  times?: 0 Other Self Harm Risks: None Triggers for Past Attempts: None known Intentional Self Injurious Behavior: None Family Suicide History: No Recent stressful life event(s): Financial Problems Persecutory voices/beliefs?: Yes Depression: No Depression Symptoms: (Pt denies depressive symptoms.) Substance abuse history and/or treatment for substance abuse?: No Suicide prevention information given to non-admitted patients: Not applicable  Risk to Others within the past 6 months Homicidal Ideation: No Does patient have any lifetime risk of violence toward others beyond the six months prior to admission? : No Thoughts of Harm to Others: No Current Homicidal Intent: No Current Homicidal Plan: No Access to Homicidal Means: No Identified Victim: No one History of harm to others?: No Assessment of Violence: None Noted Violent Behavior Description: None reported Does patient have access to weapons?: No Criminal Charges Pending?: No Does patient have a court date: No Is patient on probation?: No  Psychosis Hallucinations: None noted Delusions: None noted  Mental Status Report Appearance/Hygiene: Unremarkable, In scrubs Eye Contact: Poor Motor Activity: Freedom of movement, Unremarkable Speech: Logical/coherent, Soft Level of Consciousness: Drowsy  Mood: Depressed, Sad Affect: Sad Anxiety Level: Moderate Thought Processes: Coherent, Relevant Judgement: Impaired Orientation: Person, Place, Situation, Time Obsessive Compulsive Thoughts/Behaviors: None  Cognitive Functioning Concentration: Normal Memory: Recent Intact, Remote Intact Is patient IDD: No Insight: Poor Impulse Control: Poor Appetite: Good Have you had any weight changes? : No Change Sleep: No Change Total Hours of Sleep: 8 Vegetative Symptoms: None  ADLScreening [facility] Assessment Services) Patient's cognitive ability adequate to safely complete daily activities?: Yes Patient able to express need for  assistance with ADLs?: Yes Independently performs ADLs?: Yes (appropriate for developmental age)  Prior Inpatient Therapy Prior Inpatient Therapy: No  Prior Outpatient Therapy Prior Outpatient Therapy: No Does patient have an ACCT team?: No Does patient have Intensive In-House Services?  : No Does patient have [facility] services? : No Does patient have P4CC services?: No  ADL Screening (condition at time of admission) Patient's cognitive ability adequate to safely complete daily activities?: Yes Is the patient deaf or have difficulty hearing?: No Does the patient have difficulty seeing, even when wearing glasses/contacts?: No(Pt wears contacts.) Does the patient have difficulty concentrating, remembering, or making decisions?: No Patient able to express need for assistance with ADLs?: Yes Does the patient have difficulty dressing or bathing?: No Independently performs ADLs?: Yes (appropriate for developmental age) Does the patient have difficulty walking or climbing stairs?: No Weakness of Legs: None Weakness of Arms/Hands: None       Abuse/Neglect Assessment (Assessment to be complete while patient is alone) Abuse/Neglect Assessment Can Be Completed: Yes Physical Abuse: Denies Verbal Abuse: Denies Sexual Abuse: Denies Exploitation of patient/patient's resources: Denies Self-Neglect: Denies     Regulatory affairs officer (For Healthcare) Does Patient Have a Medical Advance Directive?: No Would patient like information on creating a medical advance directive?: No - Patient declined          Disposition:  Disposition Initial Assessment Completed for this Encounter: Yes Patient referred to: Other (Comment)(To be reviewed w/ PA)  On Site Evaluation by:   Reviewed with Physician:    Raymondo Band 06/15/2018 9:33 PM

## 2018-06-15 NOTE — ED Notes (Signed)
Bed: WA30 Expected date:  Expected time:  Means of arrival:  Comments: 

## 2018-06-15 NOTE — ED Triage Notes (Signed)
Patient arrived by EMS from home. Pt overdosed amitriptyline w/ the intent to hurt herself. Pt alert and oriented x 4. Pt is drowsy per EMS.

## 2018-06-15 NOTE — ED Notes (Signed)
Family member at bedside is very aggressive. Came into hall agitated with nurse because pt was calling for help and RN was never notified. RN explained to family member that she never received the call and that she has been checking on her periodically. Family member was very rude to Advice worker. Family member stated "we need to get in the room and figure out what is wrong with her now."

## 2018-06-15 NOTE — ED Notes (Addendum)
Brant from [place] control called and requested update.

## 2018-06-15 NOTE — ED Provider Notes (Signed)
[facility] DEPT Provider Note   CSN: [id number] Arrival date & time: 06/15/18  1513     History   Chief Complaint No chief complaint on file.   HPI Jennifer Haas is a 43 y.o. female who presents emergency department after intentional overdose.  Is a history of hypertension and atrial septal aneurysm.  Patient took about 20 amitriptyline 10 mg tablets around 1 PM today.  Patient states that she was feeling extremely overwhelmed because of financial difficulties she is having.  She has no previous history of intentional overdose, psychiatric illness or psychiatric hospitalization.  The patient also takes lisinopril for hypertension.  She did not take any other medications.  She does not use other drugs or alcohol.  She states that this is very out of character for her and she does not know why she acted that way earlier.  She is here voluntarily.  Patient denies hallucinations or homicidality.  She feels sleepy but has no other complaints.  HPI  Past Medical History:  Diagnosis Date  . Gestational diabetes    With G4 diet controlled  . Hypertension   . Incompetence of cervix   . Prior pregnancy with fetal demise   . PVC's (premature ventricular contractions)     Patient Active Problem List   Diagnosis Date Noted  . Atrial septal aneurysm 09/19/2013  . Premature atrial contractions 09/19/2013  . HTN (hypertension) 09/19/2013  . Abdominal pain, LLQ 05/18/2011    Past Surgical History:  Procedure Laterality Date  . ABDOMINAL CERCLAGE  05/29/2011   Procedure: CERCLAGE ABDOMINAL;  Surgeon: Darlyn Chamber, MD;  Location: [facility];  Service: Gynecology;  Laterality: N/A;  need to use mersilene band  . ABDOMINAL CERCLAGE  10/20/2011   Procedure: CERCLAGE ABDOMINAL;  Surgeon: Cyril Mourning, MD;  Location: [facility];  Service: Gynecology;  Laterality: N/A;  Removal of abdominal cerclage  . CERVICAL CERCLAGE    . LEEP    . TUBAL LIGATION       OB  History    Gravida  7   Para  7   Term  1   Preterm  6   AB  0   Living  4     SAB  0   TAB  0   Ectopic  0   Multiple  0   Live Births  6            Home Medications    Prior to Admission medications   Medication Sig Start Date End Date Taking? Authorizing Provider  amitriptyline (ELAVIL) 10 MG tablet Take 10 mg by mouth daily. 06/02/18  Yes [provider]  lisinopril (PRINIVIL,ZESTRIL) 10 MG tablet Take 10 mg by mouth daily.   Yes [provider]  cyclobenzaprine (FLEXERIL) 10 MG tablet Take 1 tablet (10 mg total) by mouth 2 (two) times daily as needed for muscle spasms. Patient not taking: Reported on 12/17/2017 03/03/15   Barrett, Lahoma Crocker, PA-C  HYDROcodone-acetaminophen (NORCO/VICODIN) 5-325 MG tablet Take 2 tablets by mouth every 6 (six) hours as needed. Patient not taking: Reported on 06/15/2018 12/21/17   Vanessa Kick, MD  methocarbamol (ROBAXIN) 500 MG tablet Take 1 tablet (500 mg total) by mouth 2 (two) times daily. Patient not taking: Reported on 12/17/2017 07/22/17   Maczis, Barth Kirks, PA-C  naproxen (NAPROSYN) 500 MG tablet Take 1 tablet (500 mg total) by mouth 2 (two) times daily. Patient not taking: Reported on 12/17/2017 07/22/17   Alferd Apa  M, PA-C  oseltamivir (TAMIFLU) 75 MG capsule Take 1 capsule (75 mg total) by mouth every 12 (twelve) hours. Patient not taking: Reported on 12/17/2017 07/22/14   Blanchie Dessert, MD    Family History Family History  Problem Relation Age of Onset  . Diabetes Mother   . Diabetes Father   . Heart disease Father   . Hypertension Father     Social History Social History   Tobacco Use  . Smoking status: Never Smoker  . Smokeless tobacco: Never Used  Substance Use Topics  . Alcohol use: No  . Drug use: No     Allergies   Patient has no known allergies.   Review of Systems Review of Systems Ten systems reviewed and are negative for acute change, except as noted in the HPI.     Physical Exam Updated Vital Signs BP 106/70 (BP Location: Right Arm)   Pulse 96   Temp 98.6 F (37 C) (Oral)   Resp 20   Ht 5\' 5"  (1.651 m)   Wt 79.4 kg   SpO2 97%   BMI 29.12 kg/m   Physical Exam Vitals signs and nursing note reviewed.  Constitutional:      General: She is not in acute distress.    Appearance: She is well-developed. She is not diaphoretic.  HENT:     Head: Normocephalic and atraumatic.  Eyes:     General: No scleral icterus.    Conjunctiva/sclera: Conjunctivae normal.  Neck:     Musculoskeletal: Normal range of motion.  Cardiovascular:     Rate and Rhythm: Normal rate and regular rhythm.     Heart sounds: Normal heart sounds. No murmur. No friction rub. No gallop.   Pulmonary:     Effort: Pulmonary effort is normal. No respiratory distress.     Breath sounds: Normal breath sounds.  Abdominal:     General: Bowel sounds are normal. There is no distension.     Palpations: Abdomen is soft. There is no mass.     Tenderness: There is no abdominal tenderness. There is no guarding.  Skin:    General: Skin is warm and dry.  Neurological:     Mental Status: She is alert and oriented to person, place, and time.  Psychiatric:        Behavior: Behavior normal.      ED Treatments / Results  Labs (all labs ordered are listed, but only abnormal results are displayed) Labs Reviewed  COMPREHENSIVE METABOLIC PANEL - Abnormal; Notable for the following components:      Result Value   Total Protein 8.2 (*)    All other components within normal limits  ACETAMINOPHEN LEVEL - Abnormal; Notable for the following components:   Acetaminophen (Tylenol), Serum <10 (*)    All other components within normal limits  URINALYSIS, ROUTINE W REFLEX MICROSCOPIC - Abnormal; Notable for the following components:   Color, Urine STRAW (*)    Hgb urine dipstick SMALL (*)    All other components within normal limits  ACETAMINOPHEN LEVEL - Abnormal; Notable for the following  components:   Acetaminophen (Tylenol), Serum <10 (*)    All other components within normal limits  POCT I-STAT EG7 - Abnormal; Notable for the following components:   pH, Ven 7.443 (*)    pCO2, Ven 40.8 (*)    pO2, Ven 70.0 (*)    Acid-Base Excess 3.0 (*)    Potassium 5.2 (*)    All other components within normal limits  ETHANOL  RAPID URINE DRUG SCREEN, HOSP PERFORMED  CBC WITH DIFFERENTIAL/PLATELET  SALICYLATE LEVEL  I-STAT BETA HCG BLOOD, ED ([facility], [facility], [facility] ONLY)  I-STAT CREATININE, ED  CBG MONITORING, ED    EKG EKG Interpretation  Date/Time:  Tuesday June 15 2018 15:35:38 EST Ventricular Rate:  116 PR Interval:    QRS Duration: 80 QT Interval:  311 QTC Calculation: 432 R Axis:   -9 Text Interpretation:  Sinus tachycardia Ventricular premature complex Aberrant complex Consider anterior infarct Confirmed by Lennice Sites [phone]) on 06/15/2018 4:33:57 PM   ED ECG REPORT   Date: 06/15/2018  Rate: 116  Rhythm: sinus tachycardia  QRS Axis: normal  Intervals: normal  ST/T Wave abnormalities: normal  Conduction Disutrbances:none  Narrative Interpretation:   Old EKG Reviewed: faster rate  I have personally reviewed the EKG tracing and agree with the computerized printout as noted. 11 Radiology No results found.  Procedures .Critical Care Performed by: Margarita Mail, PA-C Authorized by: Margarita Mail, PA-C   Critical care provider statement:    Critical care time (minutes):  50   Critical care was necessary to treat or prevent imminent or life-threatening deterioration of the following conditions:  Toxidrome   Critical care was time spent personally by me on the following activities:  Discussions with consultants, evaluation of patient's response to treatment, examination of patient, ordering and performing treatments and interventions, ordering and review of laboratory studies, ordering and review of radiographic studies, pulse oximetry, re-evaluation of  patient's condition, obtaining history from patient or surrogate and review of old charts   (including critical care time)  Medications Ordered in ED Medications  diphenhydrAMINE (BENADRYL) capsule 50 mg (50 mg Oral Given 06/15/18 1721)     Initial Impression / Assessment and Plan / ED Course  I have reviewed the triage vital signs and the nursing notes.  Pertinent labs & imaging results that were available during my care of the patient were reviewed by me and considered in my medical decision making (see chart for details).     11:10 AM BP 106/70 (BP Location: Right Arm)   Pulse 96   Temp 98.6 F (37 C) (Oral)   Resp 20   Ht 5\' 5"  (1.651 m)   Wt 79.4 kg   SpO2 97%   BMI 29.28 kg/m  43 year old female with intentional overdose on amitriptyline.  I spoke with recent control who recommends a period of 6 to 8 hours of monitoring from 1 PM.,  They recommend monitoring for widening of the QRS and if that occurs bicarb boluses at 1 to 2 mEq/kg. Recommend monitoring for acidosis with an ABG, if there is hypertension patient will need bicarb and fluids with pressors if necessary.  Potential for seizures which should be treated with benzodiazepine should they occur.  Patient has a Tylenol and salicylate level letter sent pending.  Patient understands that she will need both medical and psychiatric clearance and is currently here voluntarily.  I have explained that should she decide she needs to leave she will be committed.     Patient observed in the emergency department for over 8 hours.  She did attempt to leave and IVC paperwork was filed.  History gathered from the patient as well as her husband.  Review of EMR shows no previous psychiatric history or inpatient psychiatric hospitalizations.  her husband discussed the case with Curlene Dolphin, our therapeutic treatment specialists and does confirm that the patient although quite out of character was trying to harm herself.  She  is  qualified for inpatient psychiatric admission.  I have reviewed the patient's lab work which shows no metabolic acidosis, mild microscopic hematuria without evidence of infection.  Metabolic panel shows no significant kidney injury or anion gap. The patient has been on cardiac monitoring for over 8 hours without any decompensation or widening of her QRS. Patient is medically clear for psychiatric admission.  Final Clinical Impressions(s) / ED Diagnoses   Final diagnoses:  Suicide attempt [facility])  Intentional overdose of drug in tablet form [facility])    ED Discharge Orders    None       Margarita Mail, PA-C 06/16/18 [place], [place], DO 06/17/18 2207

## 2018-06-15 NOTE — ED Notes (Signed)
Bed: WA27 Expected date:  Expected time:  Means of arrival:  Comments: Hold for room 23

## 2018-06-16 ENCOUNTER — Inpatient Hospital Stay (HOSPITAL_COMMUNITY)
Admission: AD | Admit: 2018-06-16 | Discharge: 2018-06-17 | DRG: 885 | Disposition: A | Discharge status: Home / Self Care | Payer: Medicaid Other | Source: Intra-hospital | Attending: Psychiatry | Admitting: Psychiatry

## 2018-06-16 ENCOUNTER — Encounter (HOSPITAL_COMMUNITY): Payer: Self-pay | Admitting: *Deleted

## 2018-06-16 ENCOUNTER — Other Ambulatory Visit: Payer: Self-pay | Admitting: Registered Nurse

## 2018-06-16 ENCOUNTER — Other Ambulatory Visit: Payer: Self-pay

## 2018-06-16 DIAGNOSIS — Z8632 Personal history of gestational diabetes: Secondary | ICD-10-CM | POA: Diagnosis not present

## 2018-06-16 DIAGNOSIS — Z8249 Family history of ischemic heart disease and other diseases of the circulatory system: Secondary | ICD-10-CM

## 2018-06-16 DIAGNOSIS — F322 Major depressive disorder, single episode, severe without psychotic features: Secondary | ICD-10-CM | POA: Diagnosis not present

## 2018-06-16 DIAGNOSIS — F329 Major depressive disorder, single episode, unspecified: Secondary | ICD-10-CM

## 2018-06-16 DIAGNOSIS — I10 Essential (primary) hypertension: Secondary | ICD-10-CM | POA: Diagnosis present

## 2018-06-16 DIAGNOSIS — T1491XA Suicide attempt, initial encounter: Secondary | ICD-10-CM | POA: Diagnosis present

## 2018-06-16 DIAGNOSIS — Z599 Problem related to housing and economic circumstances, unspecified: Secondary | ICD-10-CM

## 2018-06-16 DIAGNOSIS — Z915 Personal history of self-harm: Secondary | ICD-10-CM | POA: Diagnosis not present

## 2018-06-16 DIAGNOSIS — R4587 Impulsiveness: Secondary | ICD-10-CM | POA: Diagnosis present

## 2018-06-16 DIAGNOSIS — Z833 Family history of diabetes mellitus: Secondary | ICD-10-CM

## 2018-06-16 DIAGNOSIS — T43012D Poisoning by tricyclic antidepressants, intentional self-harm, subsequent encounter: Secondary | ICD-10-CM | POA: Diagnosis not present

## 2018-06-16 DIAGNOSIS — G4700 Insomnia, unspecified: Secondary | ICD-10-CM | POA: Diagnosis present

## 2018-06-16 DIAGNOSIS — Z79899 Other long term (current) drug therapy: Secondary | ICD-10-CM

## 2018-06-16 NOTE — ED Notes (Addendum)
Poison control called and requested recent labs, vital signs and EKG results. After communicating results, agent stated that patient has now been cleared by Poison Control.

## 2018-06-16 NOTE — ED Notes (Signed)
Pt belongings given to officer and pt is leaving [facility] at this time. Pt stable at time of discharge.

## 2018-06-16 NOTE — ED Notes (Signed)
[facility] TEAM  Provider at bedside.

## 2018-06-16 NOTE — Progress Notes (Signed)
Jennifer Haas is a 43 year old female pt admitted on involuntary basis. On admission, she reports she was home looking at bills and became overwhelmed and took the overdose. She denies SI on admission and is able to contract for safety in the hospital. She reports having never been hospitalized before and regrets the decision that she made. She reports that she was taking amitriptyline to help her with sleep. She denies any substance abuse issues. She reports that she lives at home with her husband and children and will go back there after discharge. Jennifer Haas was cooperative throughout the admission process. She was escorted to the unit, oriented to the milieu and safety maintained.

## 2018-06-16 NOTE — Tx Team (Signed)
Initial Treatment Plan 06/16/2018 9:51 PM Jennifer Haas [id number]    PATIENT STRESSORS: Financial difficulties   PATIENT STRENGTHS: Ability for insight Active sense of humor Average or above average intelligence Capable of independent living Communication skills General fund of knowledge Motivation for treatment/growth Supportive family/friends   PATIENT IDENTIFIED PROBLEMS: Depression Suicidal thoughts "I was thinking at the hospital why did I do this"                     DISCHARGE CRITERIA:  Ability to meet basic life and health needs Improved stabilization in mood, thinking, and/or behavior Reduction of life-threatening or endangering symptoms to within safe limits Verbal commitment to aftercare and medication compliance  PRELIMINARY DISCHARGE PLAN: Attend aftercare/continuing care group Return to previous living arrangement  PATIENT/FAMILY INVOLVEMENT: This treatment plan has been presented to and reviewed with the patient, [name], and/or family member, .  The patient and family have been given the opportunity to ask questions and make suggestions.  [place], [place], [place] 06/16/2018, 9:51 PM

## 2018-06-16 NOTE — Consult Note (Signed)
[place] Psychiatry Consult   Reason for Consult:  Elavil overdose  Referring Physician:  EDP  Patient Identification: Jennifer Haas MRN:  [id number] Principal Diagnosis: Suicide attempt [facility]) Diagnosis:  Principal Problem:   Suicide attempt ([place])   Total Time spent with patient: 30 minutes  Subjective:   Jennifer Haas is a 43 y.o. female patient admitted with Elavil overdose.  HPI:   Per chart review, patient was admitted with Elavil overdose. She ingested Elavil 10 mg tablets (#20) in the setting of financial stressors. On interview, she reports intermittently taking Elavil for insomnia. She reports that she never takes more than 3 tablets at a time. She reports that this was out of character for her. She is normally able to reach out to family for support but she did not this time. She reports that after taking the medication that she called her sister who then called EMS although yesterday she reported that her son called her and felt that she was altered and he called her sister. She denies current SI, HI or AVH. She denies a prior psychiatric history or suicide attempts. Her husband is at bedside with her consent. He feels that she is safe to return home and has locked up the medications. She does not have access to guns or weapons. She would be returning home to the same stressors so it is felt that inpatient psychiatric hospitalization is the least restrictive alternative at this time so that she may be discharged with resources in place to maintain her safety.   Past Psychiatric History: Denies   Risk to Self: Suicidal Ideation: Yes-Currently Present(Pt is denying that this was a suicide attempt.) Suicidal Intent: No(Pt is denying suicidal intention.) Is patient at risk for suicide?: Yes Suicidal Plan?: No(Pt denies that this was a plan to kill self.) Access to Means: Yes Specify Access to Suicidal Means: Medications What has been your use of drugs/alcohol within  the last 12 months?: N/A How many times?: 0 Other Self Harm Risks: None Triggers for Past Attempts: None known Intentional Self Injurious Behavior: None Risk to Others: Homicidal Ideation: No Thoughts of Harm to Others: No Current Homicidal Intent: No Current Homicidal Plan: No Access to Homicidal Means: No Identified Victim: No one History of harm to others?: No Assessment of Violence: None Noted Violent Behavior Description: None reported Does patient have access to weapons?: No Criminal Charges Pending?: No Does patient have a court date: No Prior Inpatient Therapy: Prior Inpatient Therapy: No Prior Outpatient Therapy: Prior Outpatient Therapy: No Does patient have an ACCT team?: No Does patient have Intensive In-House Services?  : No Does patient have [facility] services? : No Does patient have P4CC services?: No  Past Medical History:  Past Medical History:  Diagnosis Date  . Gestational diabetes    With G4 diet controlled  . Hypertension   . Incompetence of cervix   . Prior pregnancy with fetal demise   . PVC's (premature ventricular contractions)     Past Surgical History:  Procedure Laterality Date  . ABDOMINAL CERCLAGE  05/29/2011   Procedure: CERCLAGE ABDOMINAL;  Surgeon: Darlyn Chamber, MD;  Location: [facility];  Service: Gynecology;  Laterality: N/A;  need to use mersilene band  . ABDOMINAL CERCLAGE  10/20/2011   Procedure: CERCLAGE ABDOMINAL;  Surgeon: Cyril Mourning, MD;  Location: [place] [facility];  Service: Gynecology;  Laterality: N/A;  Removal of abdominal cerclage  . CERVICAL CERCLAGE    . LEEP    . TUBAL LIGATION  Family History:  Family History  Problem Relation Age of Onset  . Diabetes Mother   . Diabetes Father   . Heart disease Father   . Hypertension Father    Family Psychiatric  History: Denies  Social History:  Social History   Substance and Sexual Activity  Alcohol Use No     Social History   Substance and Sexual Activity  Drug Use No     Social History   Socioeconomic History  . Marital status: Married    Spouse name: Not on file  . Number of children: Not on file  . Years of education: Not on file  . Highest education level: Not on file  Occupational History  . Not on file  Social Needs  . Financial resource strain: Not on file  . Food insecurity:    Worry: Not on file    Inability: Not on file  . Transportation needs:    Medical: Not on file    Non-medical: Not on file  Tobacco Use  . Smoking status: Never Smoker  . Smokeless tobacco: Never Used  Substance and Sexual Activity  . Alcohol use: No  . Drug use: No  . Sexual activity: Yes    Birth control/protection: Surgical  Lifestyle  . Physical activity:    Days per week: Not on file    Minutes per session: Not on file  . Stress: Not on file  Relationships  . Social connections:    Talks on phone: Not on file    Gets together: Not on file    Attends religious service: Not on file    Active member of club or organization: Not on file    Attends meetings of clubs or organizations: Not on file    Relationship status: Not on file  Other Topics Concern  . Not on file  Social History Narrative  . Not on file   Additional Social History: She lives at home with her husband, 80 y/o and 66 y/o children. She has 2 adult children that do not live at home. She manages a temp agency. She denies alcohol or illicit substance use.     Allergies:  No Known Allergies  Labs:  Results for orders placed or performed during the hospital encounter of 06/15/18 (from the past 48 hour(s))  Comprehensive metabolic panel     Status: Abnormal   Collection Time: 06/15/18  3:34 PM  Result Value Ref Range   Sodium 137 135 - 145 mmol/L   Potassium 4.8 3.5 - 5.1 mmol/L   Chloride 104 98 - 111 mmol/L   CO2 24 22 - 32 mmol/L   Glucose, Bld 96 70 - 99 mg/dL   BUN 16 6 - 20 mg/dL   Creatinine, Ser 0.70 0.44 - 1.00 mg/dL   Calcium 9.5 8.9 - 10.3 mg/dL   Total Protein 8.2 (H)  6.5 - 8.1 g/dL   Albumin 4.3 3.5 - 5.0 g/dL   AST 33 15 - 41 U/L   ALT 13 0 - 44 U/L   Alkaline Phosphatase 61 38 - 126 U/L   Total Bilirubin 1.2 0.3 - 1.2 mg/dL   GFR calc non Af Amer >60 >60 mL/min   GFR calc Af Amer >60 >60 mL/min   Anion gap 9 5 - 15    Comment: Performed at [facility], [place] [address] [place], [place] [address]  Ethanol     Status: None   Collection Time: 06/15/18  3:34 PM  Result  Value Ref Range   Alcohol, Ethyl (B) <10 <10 mg/dL    Comment: (NOTE) Lowest detectable limit for serum alcohol is 10 mg/dL. For medical purposes only. Performed at [facility], [place] [address] [place], [place] [address]   CBC with Diff     Status: None   Collection Time: 06/15/18  3:34 PM  Result Value Ref Range   WBC 7.5 4.0 - 10.5 K/uL   RBC 4.72 3.87 - 5.11 MIL/uL   Hemoglobin 13.3 12.0 - 15.0 g/dL   HCT 41.0 36.0 - 46.0 %   MCV 86.9 80.0 - 100.0 fL   MCH 28.2 26.0 - 34.0 pg   MCHC 32.4 30.0 - 36.0 g/dL   RDW 14.2 11.5 - 15.5 %   Platelets 312 150 - 400 K/uL   nRBC 0.0 0.0 - 0.2 %   Neutrophils Relative % 61 %   Neutro Abs 4.5 1.7 - 7.7 K/uL   Lymphocytes Relative 30 %   Lymphs Abs 2.3 0.7 - 4.0 K/uL   Monocytes Relative 8 %   Monocytes Absolute 0.6 0.1 - 1.0 K/uL   Eosinophils Relative 1 %   Eosinophils Absolute 0.1 0.0 - 0.5 K/uL   Basophils Relative 0 %   Basophils Absolute 0.0 0.0 - 0.1 K/uL   Immature Granulocytes 0 %   Abs Immature Granulocytes 0.03 0.00 - 0.07 K/uL    Comment: Performed at [facility], [place] [address] [place], [place] [address]  Salicylate level     Status: None   Collection Time: 06/15/18  3:34 PM  Result Value Ref Range   Salicylate Lvl <5.6 2.8 - 30.0 mg/dL    Comment: Performed at [facility], [place] [address], [place], [place] [address]  Acetaminophen level     Status: Abnormal   Collection Time: 06/15/18  3:34 PM  Result Value Ref Range   Acetaminophen  (Tylenol), Serum <10 (L) 10 - 30 ug/mL    Comment: (NOTE) Therapeutic concentrations vary significantly. A range of 10-30 ug/mL  may be an effective concentration for many patients. However, some  are best treated at concentrations outside of this range. Acetaminophen concentrations >150 ug/mL at 4 hours after ingestion  and >50 ug/mL at 12 hours after ingestion are often associated with  toxic reactions. Performed at [facility], [place] [address] [place], [place] [address]   CBG monitoring, ED     Status: None   Collection Time: 06/15/18  3:58 PM  Result Value Ref Range   Glucose-Capillary 88 70 - 99 mg/dL  I-Stat beta hCG blood, ED     Status: None   Collection Time: 06/15/18  4:00 PM  Result Value Ref Range   I-stat hCG, quantitative <5.0 <5 mIU/mL   Comment 3            Comment:   GEST. AGE      CONC.  (mIU/mL)   <=1 WEEK        5 - 50     2 WEEKS       50 - 500     3 WEEKS       100 - 10,000     4 WEEKS     1,000 - 30,000        FEMALE AND NON-PREGNANT FEMALE:     LESS THAN 5 mIU/mL   I-stat Creatinine, ED     Status: None   Collection Time: 06/15/18  4:01 PM  Result  Value Ref Range   Creatinine, Ser 0.50 0.44 - 1.00 mg/dL  POCT I-Stat EG7     Status: Abnormal   Collection Time: 06/15/18  4:01 PM  Result Value Ref Range   pH, Ven 7.443 (H) 7.250 - 7.430   pCO2, Ven 40.8 (L) 44.0 - 60.0 mmHg   pO2, Ven 70.0 (H) 32.0 - 45.0 mmHg   Bicarbonate 27.9 20.0 - 28.0 mmol/L   TCO2 29 22 - 32 mmol/L   O2 Saturation 94.0 %   Acid-Base Excess 3.0 (H) 0.0 - 2.0 mmol/L   Sodium 138 135 - 145 mmol/L   Potassium 5.2 (H) 3.5 - 5.1 mmol/L   Calcium, Ion 1.18 1.15 - 1.40 mmol/L   HCT 41.0 36.0 - 46.0 %   Hemoglobin 13.9 12.0 - 15.0 g/dL   Patient temperature HIDE    Sample type VENOUS   Urine rapid drug screen (hosp performed)     Status: None   Collection Time: 06/15/18  4:13 PM  Result Value Ref Range   Opiates NONE DETECTED NONE DETECTED   Cocaine NONE  DETECTED NONE DETECTED   Benzodiazepines NONE DETECTED NONE DETECTED   Amphetamines NONE DETECTED NONE DETECTED   Tetrahydrocannabinol NONE DETECTED NONE DETECTED   Barbiturates NONE DETECTED NONE DETECTED    Comment: (NOTE) DRUG SCREEN FOR MEDICAL PURPOSES ONLY.  IF CONFIRMATION IS NEEDED FOR ANY PURPOSE, NOTIFY LAB WITHIN 5 DAYS. LOWEST DETECTABLE LIMITS FOR URINE DRUG SCREEN Drug Class                     Cutoff (ng/mL) Amphetamine and metabolites    1000 Barbiturate and metabolites    200 Benzodiazepine                 161 Tricyclics and metabolites     300 Opiates and metabolites        300 Cocaine and metabolites        300 THC                            50 Performed at [facility], [place] [address], [place], [place] [address]   Urinalysis, Routine w reflex microscopic     Status: Abnormal   Collection Time: 06/15/18  4:13 PM  Result Value Ref Range   Color, Urine STRAW (A) YELLOW   APPearance CLEAR CLEAR   Specific Gravity, Urine 1.006 1.005 - 1.030   pH 6.0 5.0 - 8.0   Glucose, UA NEGATIVE NEGATIVE mg/dL   Hgb urine dipstick SMALL (A) NEGATIVE   Bilirubin Urine NEGATIVE NEGATIVE   Ketones, ur NEGATIVE NEGATIVE mg/dL   Protein, ur NEGATIVE NEGATIVE mg/dL   Nitrite NEGATIVE NEGATIVE   Leukocytes,Ua NEGATIVE NEGATIVE   RBC / HPF 0-5 0 - 5 RBC/hpf   Bacteria, UA NONE SEEN NONE SEEN   Squamous Epithelial / LPF 0-5 0 - 5   Mucus PRESENT     Comment: Performed at [facility], [place] [address] [place], [place] [address]  Acetaminophen level     Status: Abnormal   Collection Time: 06/15/18  8:39 PM  Result Value Ref Range   Acetaminophen (Tylenol), Serum <10 (L) 10 - 30 ug/mL    Comment: (NOTE) Therapeutic concentrations vary significantly. A range of 10-30 ug/mL  may be an effective concentration for many patients. However, some  are best treated at concentrations outside of this range. Acetaminophen concentrations >150 ug/mL at  4  hours after ingestion  and >50 ug/mL at 12 hours after ingestion are often associated with  toxic reactions. Performed at [facility], [place] [address], [place], [place] [address]     No current facility-administered medications for this encounter.    Current Outpatient Medications  Medication Sig Dispense Refill  . amitriptyline (ELAVIL) 10 MG tablet Take 10 mg by mouth daily.    Marland Kitchen lisinopril (PRINIVIL,ZESTRIL) 10 MG tablet Take 10 mg by mouth daily.    . cyclobenzaprine (FLEXERIL) 10 MG tablet Take 1 tablet (10 mg total) by mouth 2 (two) times daily as needed for muscle spasms. (Patient not taking: Reported on 12/17/2017) 20 tablet 0  . HYDROcodone-acetaminophen (NORCO/VICODIN) 5-325 MG tablet Take 2 tablets by mouth every 6 (six) hours as needed. (Patient not taking: Reported on 06/15/2018) 6 tablet 0  . methocarbamol (ROBAXIN) 500 MG tablet Take 1 tablet (500 mg total) by mouth 2 (two) times daily. (Patient not taking: Reported on 12/17/2017) 20 tablet 0  . naproxen (NAPROSYN) 500 MG tablet Take 1 tablet (500 mg total) by mouth 2 (two) times daily. (Patient not taking: Reported on 12/17/2017) 30 tablet 0  . oseltamivir (TAMIFLU) 75 MG capsule Take 1 capsule (75 mg total) by mouth every 12 (twelve) hours. (Patient not taking: Reported on 12/17/2017) 10 capsule 0    Musculoskeletal: Strength & Muscle Tone: within normal limits Gait & Station: UTA since patient is lying in bed. Patient leans: N/A  Psychiatric Specialty Exam: Physical Exam  Nursing note and vitals reviewed. Constitutional: She is oriented to person, place, and time. She appears well-developed and well-nourished.  HENT:  Head: Normocephalic and atraumatic.  Neck: Normal range of motion.  Respiratory: Effort normal.  Musculoskeletal: Normal range of motion.  Neurological: She is alert and oriented to person, place, and time.  Psychiatric: Her speech is normal and behavior is normal. Judgment and thought  content normal. Cognition and memory are normal. She exhibits a depressed mood.    Review of Systems  Psychiatric/Behavioral: Positive for depression. Negative for hallucinations, substance abuse and suicidal ideas. The patient does not have insomnia.   All other systems reviewed and are negative.   Blood pressure 123/84, pulse 86, temperature 98.4 F (36.9 C), temperature source Oral, resp. rate 18, height 5\' 5"  (1.651 m), weight 79.4 kg, SpO2 98 %.Body mass index is 29.12 kg/m.  General Appearance: Fairly Groomed, middle aged, African American female, wearing paper hospital scrubs who is lying in bed. NAD.   Eye Contact:  Good  Speech:  Clear and Coherent and Normal Rate  Volume:  Normal  Mood:  Depressed  Affect:  Constricted  Thought Process:  Goal Directed, Linear and Descriptions of Associations: Intact  Orientation:  Full (Time, Place, and Person)  Thought Content:  Logical  Suicidal Thoughts:  No  Homicidal Thoughts:  No  Memory:  Immediate;   Good Recent;   Good Remote;   Good  Judgement:  Fair  Insight:  Fair  Psychomotor Activity:  Normal  Concentration:  Concentration: Good and Attention Span: Good  Recall:  Good  Fund of Knowledge:  Good  Language:  Good  Akathisia:  No  Handed:  Right  AIMS (if indicated):   N/A  Assets:  Communication Skills Desire for Improvement Financial Resources/Insurance Housing Intimacy Physical Health Resilience Social Support  ADL's:  Intact  Cognition:  WNL  Sleep:   Okay   Assessment:  Jennifer Haas is a 43 y.o. female who  was admitted with suicide attempt by Elavil overdose. She reports feeling overwhelmed in the setting of financial stressors. She appears depressed on presentation. She warrants inpatient psychiatric hospitalization for stabilization and treatment.   Treatment Plan Summary: -Continue Benadryl PRN for insomnia.   Disposition: Recommend psychiatric Inpatient admission when medically  cleared.  Faythe Dingwall, DO 06/16/2018 1:37 PM

## 2018-06-16 NOTE — BH Assessment (Signed)
[facility] Assessment Progress Note  Per Buford Dresser, DO, this pt requires psychiatric hospitalization.  Drake Leach, RN, [facility] has assigned pt to [facility] Rm [id number]; [facility] will be ready to receive pt at 20:00.  Pt presents under IVC initiated by EDP Lennice Sites, MD, and IVC documents have been faxed to [facility].  Pt's nurse, Caryl Pina, has been notified, and agrees to call report to [phone].  Pt is to be transported via [facility].   Jalene Mullet, [place] Coordinator [phone]

## 2018-06-16 NOTE — ED Notes (Signed)
Pt awaiting transport.

## 2018-06-17 DIAGNOSIS — T43012D Poisoning by tricyclic antidepressants, intentional self-harm, subsequent encounter: Secondary | ICD-10-CM

## 2018-06-17 DIAGNOSIS — F322 Major depressive disorder, single episode, severe without psychotic features: Principal | ICD-10-CM

## 2018-06-17 MED ORDER — CYCLOBENZAPRINE HCL 10 MG PO TABS: 10.0000 mg | ORAL_TABLET | Freq: Two times a day (BID) | ORAL | 0 refills | Status: DC | PRN

## 2018-06-17 MED ORDER — NAPROXEN 500 MG PO TABS: 500.0000 mg | ORAL_TABLET | Freq: Two times a day (BID) | ORAL | 0 refills | Status: DC

## 2018-06-17 MED ORDER — LISINOPRIL 10 MG PO TABS: 10.0000 mg | ORAL_TABLET | Freq: Every day | ORAL | Status: DC

## 2018-06-17 NOTE — BHH Suicide Risk Assessment (Signed)
[facility] Discharge Suicide Risk Assessment   Principal Problem: Overdose Discharge Diagnoses: Active Problems:   MDD (major depressive disorder), severe ([place])   Total Time spent with patient: 45 minutes   Mental Status Per Nursing Assessment::   On Admission:  NA  Demographic Factors:  NA  Loss Factors: NA  Historical Factors: NA  Risk Reduction Factors:   Sense of responsibility to family and Religious beliefs about death  Continued Clinical Symptoms:  Dysthymia  Cognitive Features That Contribute To Risk:  None    Suicide Risk:  Minimal: No identifiable suicidal ideation.  Patients presenting with no risk factors but with morbid ruminations; may be classified as minimal risk based on the severity of the depressive symptoms    Plan Of Care/Follow-up recommendations:  Activity:  full  Atavia Poppe, MD 06/17/2018, 9:34 AM

## 2018-06-17 NOTE — Discharge Summary (Signed)
Physician Discharge Summary Note  Patient:  Jennifer Haas is an 43 y.o., female  MRN:  [id number]  DOB:  [birth year]  Patient phone:  [phone] (home)   Patient address:   [place] [address],   Total Time spent with patient: Greater than 30 minutes  Date of Admission:  06/16/2018  Date of Discharge: 06-16-18  Reason for Admission: Impulsive overdose on Elavil.  Principal Problem: MDD (major depressive disorder), severe ([place])  Discharge Diagnoses: Principal Problem:   MDD (major depressive disorder), severe ([place])  Past Psychiatric History: Insomnia.  Past Medical History:  Past Medical History:  Diagnosis Date  . Gestational diabetes    With G4 diet controlled  . Hypertension   . Incompetence of cervix   . Prior pregnancy with fetal demise   . PVC's (premature ventricular contractions)     Past Surgical History:  Procedure Laterality Date  . ABDOMINAL CERCLAGE  05/29/2011   Procedure: CERCLAGE ABDOMINAL;  Surgeon: Darlyn Chamber, MD;  Location: [facility];  Service: Gynecology;  Laterality: N/A;  need to use mersilene band  . ABDOMINAL CERCLAGE  10/20/2011   Procedure: CERCLAGE ABDOMINAL;  Surgeon: Cyril Mourning, MD;  Location: [facility];  Service: Gynecology;  Laterality: N/A;  Removal of abdominal cerclage  . CERVICAL CERCLAGE    . LEEP    . TUBAL LIGATION     Family History:  Family History  Problem Relation Age of Onset  . Diabetes Mother   . Diabetes Father   . Heart disease Father   . Hypertension Father    Family Psychiatric  History: See H&P Social History:  Social History   Substance and Sexual Activity  Alcohol Use No     Social History   Substance and Sexual Activity  Drug Use No    Social History   Socioeconomic History  . Marital status: Married    Spouse name: Not on file  . Number of children: Not on file  . Years of education: Not on file  . Highest education level: Not on file  Occupational  History  . Not on file  Social Needs  . Financial resource strain: Not on file  . Food insecurity:    Worry: Not on file    Inability: Not on file  . Transportation needs:    Medical: Not on file    Non-medical: Not on file  Tobacco Use  . Smoking status: Never Smoker  . Smokeless tobacco: Never Used  Substance and Sexual Activity  . Alcohol use: No  . Drug use: No  . Sexual activity: Yes    Birth control/protection: Surgical  Lifestyle  . Physical activity:    Days per week: Not on file    Minutes per session: Not on file  . Stress: Not on file  Relationships  . Social connections:    Talks on phone: Not on file    Gets together: Not on file    Attends religious service: Not on file    Active member of club or organization: Not on file    Attends meetings of clubs or organizations: Not on file    Relationship status: Not on file  Other Topics Concern  . Not on file  Social History Narrative  . Not on file   Hospital Course: (Per Md's admission evaluation): Patient is very pleasant. She was evaluated through the emergency department and referred to [place] to make sure she was indeed stable and not  suicidal. She states that she and her husband moved into a new home and she had some sticker shock with a new mortgage but they are not under water and they can afford it, but this caused her to impulsively overdose on 10 Flexeril but then she immediately called her sister, she sought help she is not suicidal now. She is alert oriented cooperative denies wanting to harm self or others & can contract fully. I the attending psychiatrist find her to be in no acute danger to self or others. She does not believe she needs an antidepressant. She believes it was just a moment of stress and she can contract fully.  (Discharge): This is a very brief hospital stay for this 43 year old AA female with previous psychiatric hx of insomnia of which she was receiving Elavil to aid her sleep at night. And  from chart review results as well, this is her first inpatient psychiatric admission per involuntarily commitment because report indicated that patient impulsively overdosed on some tablets of Elavil, apparently triggered by financial related stressors. After ingestion of the Elavil tablets, patient notified her sister who called the EMS & she was taken to the ED for evaluation. While at the ED, chart review reports indicated that patient stated that she felt regretful of her impulsive overdose on Elavil & should have reached out to her family for support instead. While at the ED, she denied feeling depressed, suicidal/homicidal ideations. She denies any hx of psychiatric hospitalizations. She denies any hx of suicide attempts. She has asked to be discharged from the ED then, but sent to the [facility] under IVC for a more thorough psychiatric evaluation to assure her safety.  During the above admission evaluation with the attending psychiatrist this am, Jennifer Haas reports that she is doing well. She presented with a good affect, good judgement, regretful of her overly reaction to their financial issues. She was making good eye contact. She denies any serious mental health issues other than the insomnia which is not a new issue for her. She denies any suicidal/homicidal ideations, AVH, delusional thoughts or paranoia. There are no documented hx of suicide attempts by this patient or any familial hx of completed suicide attempt. She is married, lives with her husband, has children & says she has the support of her family including her husband. Assessments so far has revealed that patient does not have or own a gun & has no access to any guns or other weapons. She is seriously asking to be discharged to her home with family. Chart review has shown that patient's husband has been supportive of her & was at her bedside at the ED during the ED evaluation.  Although with a serious suicide attempt on Elavil, patient continues  to endorse that it was rather an impulsive act other than planned. She says she has the support of her family & should have reached out to them first. She says she has learned a valuable lesson from this overdose attempt on how her family cares & loves her. Besides, she says although with some financial stressors, she does have a very genuine reason to live & that is her family. She states that she is not depressed or anxious & does not want to be on any medications for depression. And because there are no clinical criteria to keep this patient admitted to the hospital, she will be discharged as requested in the care/support of her husband. She is not being discharged with medications. We discontinued the  Elavil. She is being discharged with the resumption of her pertinent medications for her other medical issues. However, she is cautioned, instructed & encouraged to reach out to her outpatient provider(s) on their recommendation on how to take her current medications for her other medical issues. In the event of worsening symptoms, patient is instructed to call the crisis hotline, 911 and or go to the nearest ED for appropriate evaluation and treatment of symptoms. Jennifer Haas left [facility] with all personal belongings in no apparent distress. Transportation per husband.  Physical Findings: AIMS: Facial and Oral Movements Muscles of Facial Expression: None, normal Lips and Perioral Area: None, normal Jaw: None, normal Tongue: None, normal,Extremity Movements Upper (arms, wrists, hands, fingers): None, normal Lower (legs, knees, ankles, toes): None, normal, Trunk Movements Neck, shoulders, hips: None, normal, Overall Severity Severity of abnormal movements (highest score from questions above): None, normal Incapacitation due to abnormal movements: None, normal Patient's awareness of abnormal movements (rate only patient's report): No Awareness, Dental Status Current problems with teeth and/or dentures?:  No Does patient usually wear dentures?: No  CIWA:  CIWA-Ar Total: 0 COWS:  COWS Total Score: 0  Musculoskeletal: Strength & Muscle Tone: within normal limits Gait & Station: normal Patient leans: N/A  Psychiatric Specialty Exam: Physical Exam  Nursing note and vitals reviewed. Constitutional: She appears well-developed.  HENT:  Head: Normocephalic.  Eyes: Pupils are equal, round, and reactive to light.  Cardiovascular: Normal rate.  Respiratory: Effort normal.  GI: Soft.  Genitourinary:    Genitourinary Comments: Deferred   Musculoskeletal: Normal range of motion.  Neurological: She is alert.  Skin: Skin is warm.    Review of Systems  Constitutional: Negative.   HENT: Negative.   Eyes: Negative.   Respiratory: Negative.  Negative for cough and shortness of breath.   Cardiovascular: Negative.  Negative for chest pain and palpitations.  Gastrointestinal: Negative.  Negative for abdominal pain, heartburn, nausea and vomiting.  Genitourinary: Negative.   Musculoskeletal: Negative.   Skin: Negative.   Neurological: Negative.  Negative for dizziness and headaches.  Endo/Heme/Allergies: Negative.   Psychiatric/Behavioral: Positive for depression (Stable). Negative for hallucinations, memory loss, substance abuse and suicidal ideas. The patient has insomnia (Hx. of prior to admission.). The patient is not nervous/anxious.     Blood pressure 111/82, pulse 96, temperature 98.1 F (36.7 C), temperature source Oral, resp. rate 20, height 5\' 5"  (1.651 m), weight 81.2 kg.Body mass index is 29.79 kg/m.  See Md's discharge SRA   Have you used any form of tobacco in the last 30 days? (Cigarettes, Smokeless Tobacco, Cigars, and/or Pipes): No  Has this patient used any form of tobacco in the last 30 days? (Cigarettes, Smokeless Tobacco, Cigars, and/or Pipes): N/A  Blood Alcohol level:  Lab Results  Component Value Date   ETH <10 13/12/6576   Metabolic Disorder Labs:  No results  found for: HGBA1C, MPG No results found for: PROLACTIN No results found for: CHOL, TRIG, HDL, CHOLHDL, VLDL, LDLCALC  See Psychiatric Specialty Exam and Suicide Risk Assessment completed by Attending Physician prior to discharge.  Discharge destination:  Home  Is patient on multiple antipsychotic therapies at discharge:  No   Has Patient had three or more failed trials of antipsychotic monotherapy by history:  No  Recommended Plan for Multiple Antipsychotic Therapies: NA Discharge Instructions    Discharge instructions   Complete by:  As directed    You are hereby instructed & encouraged to call the crisis hotline, go to the nearest ED  or call 911 in the event of:  1. Worsening symptoms. 2.  If you start to feel unsafe for yourself and or others. 3.  You start to hear voices when no one is speaking to you or you start to see things that other people are unable to see. 4. Please seek the advice of your primary care provider(s) prior to resuming your every/any other pertinent home medications you are already taking prior to this current hospitalization.     Allergies as of 06/17/2018   No Known Allergies     Medication List    STOP taking these medications   amitriptyline 10 MG tablet Commonly known as:  ELAVIL   HYDROcodone-acetaminophen 5-325 MG tablet Commonly known as:  NORCO/VICODIN   methocarbamol 500 MG tablet Commonly known as:  ROBAXIN   oseltamivir 75 MG capsule Commonly known as:  TAMIFLU     TAKE these medications     Indication  cyclobenzaprine 10 MG tablet Commonly known as:  FLEXERIL Take 1 tablet (10 mg total) by mouth 2 (two) times daily as needed for muscle spasms. (Resume as instructed by your outpatient provider) What changed:  additional instructions  Indication:  Muscle Spasm   lisinopril 10 MG tablet Commonly known as:  PRINIVIL,ZESTRIL Take 1 tablet (10 mg total) by mouth daily. (Resume as instructed by your outpatient provider): For  hypertension What changed:  additional instructions  Indication:  High Blood Pressure Disorder   naproxen 500 MG tablet Commonly known as:  NAPROSYN Take 1 tablet (500 mg total) by mouth 2 (two) times daily. (Resume as instructed by your outpatient provider): For pain What changed:  additional instructions  Indication:  Pain      Follow-up Information    Pt declines Follow up.          Follow-up recommendations: Activity:  As tolerated Diet: As recommended by your primary care doctor. Keep all scheduled follow-up appointments as recommended.   Comments: Patient is instructed prior to discharge to: Take all medications as prescribed by his/her mental healthcare provider. Report any adverse effects and or reactions from the medicines to his/her outpatient provider promptly. Patient has been instructed & cautioned: To not engage in alcohol and or illegal drug use while on prescription medicines. In the event of worsening symptoms, patient is instructed to call the crisis hotline, 911 and or go to the nearest ED for appropriate evaluation and treatment of symptoms. To follow-up with his/her primary care provider for your other medical issues, concerns and or health care needs.   Signed: Lindell Spar, NP, PMHNP, FNP-BC 06/17/2018, 2:07 PM

## 2018-06-17 NOTE — Progress Notes (Addendum)
D: Pt A & O X 3. Denies SI, HI, AVH and pain at this time. Patient slept well last night, and did not request medication to help. Her appetite is good, energy normal, concentration good. She rates her depression, hopelessness, and anxiety 0/10. She denies physical symptoms or withdrawal complaints. Her goal: " work on "learn how to manage myself" and "keep loving on my family."  D/C home as ordered. Picked up in lobby by husband.  A: D/C instructions reviewed with pt including prescriptions, medication samples and follow up appointment, compliance encouraged. All belongings from locker #42 given to pt at time of departure. Scheduled and PRN medications given with verbal education and effects monitored. Safety checks maintained without incident till time of d/c.  R: Pt receptive to care. Compliant with medications when offered. Denies adverse drug reactions when assessed. Verbalized understanding related to d/c instructions. Signed belonging sheet in agreement with items received from locker. Ambulatory with a steady gait. Appears to be in no physical distress at time of departure.

## 2018-06-17 NOTE — [facility] Counselor (Signed)
Patient is discharging within 12 hours of admission. As such, a PSA was not completed with patient.  Patient verbally contracts for safety; denies SI/HI. Patient declined outpatient follow up, stating she is close to her pastor (he visited her in the ED) and she will follow up with him for counseling and support.  Patient reports her husband will pick her up at 12pm.  Stephanie Acre, [place] Social Worker

## 2018-06-17 NOTE — Progress Notes (Signed)
  [facility] Adult Case Management Discharge Plan :  Will you be returning to the same living situation after discharge:  Yes,  home with family in [place] At discharge, do you have transportation home?: Yes,  husband will pick up at noon Do you have the ability to pay for your medications: Yes,  Income from employment  Release of information consent forms completed and in the chart;  Work Quarry manager on chart.  Patient to Follow up at: Follow-up Information    Pt declines Follow up.           Next level of care provider has access to [place] and Suicide Prevention discussed: Yes,  with patient  Have you used any form of tobacco in the last 30 days? (Cigarettes, Smokeless Tobacco, Cigars, and/or Pipes): No  Has patient been referred to the Quitline?: N/A patient is not a smoker  Patient has been referred for addiction treatment: Yes  Joellen Jersey, [place] 06/17/2018, 9:41 AM

## 2018-06-17 NOTE — H&P (Signed)
Psychiatric Admission Assessment Adult  Patient Identification: Jennifer Haas MRN:  [id number] Date of Evaluation:  06/17/2018 Chief Complaint:  mdd with suicidal attempt Principal Diagnosis: <principal problem not specified> Diagnosis:  Active Problems:   MDD (major depressive disorder), severe (HCC)  History of Present Illness:   Patient is very pleasant she was evaluated through the emergency department and referred to [place] to make sure she was indeed stable and not suicidal she states that she and her husband moved into a new home and she had some sticker shock with a new mortgage but they are not under water and they can afford it but this caused her to impulsively overdose on 10 Flexeril but then she immediately called her sister she sought help she is not suicidal now she is alert oriented cooperative denies wanting to harm self or others can contract fully I find her to be no in no acute danger to self or others she does not believe she needs an antidepressant she believes it was just a moment of stress and she can contract fully  Associated Signs/Symptoms: Depression Symptoms:  suicidal attempt, (Hypo) Manic Symptoms:  Impulsivity, Anxiety Symptoms:  Excessive Worry, Psychotic Symptoms:  n/a PTSD Symptoms: NA Total Time spent with patient: 45 minutes  Is the patient at risk to self? No.  Has the patient been a risk to self in the past 6 months? No.  Has the patient been a risk to self within the distant past? No.  Is the patient a risk to others? No.  Has the patient been a risk to others in the past 6 months? No.  Has the patient been a risk to others within the distant past? No.   Prior Inpatient Therapy:   Prior Outpatient Therapy:    Alcohol Screening: 1. How often do you have a drink containing alcohol?: Never 2. How many drinks containing alcohol do you have on a typical day when you are drinking?: 1 or 2 3. How often do you have six or more drinks on one  occasion?: Never AUDIT-C Score: 0 4. How often during the last year have you found that you were not able to stop drinking once you had started?: Never 5. How often during the last year have you failed to do what was normally expected from you becasue of drinking?: Never 6. How often during the last year have you needed a first drink in the morning to get yourself going after a heavy drinking session?: Never 7. How often during the last year have you had a feeling of guilt of remorse after drinking?: Never 8. How often during the last year have you been unable to remember what happened the night before because you had been drinking?: Never 9. Have you or someone else been injured as a result of your drinking?: No 10. Has a relative or friend or a doctor or another health worker been concerned about your drinking or suggested you cut down?: No Alcohol Use Disorder Identification Test Final Score (AUDIT): 0 Alcohol Brief Interventions/Follow-up: AUDIT Score <7 follow-up not indicated Substance Abuse History in the last 12 months:  No. Consequences of Substance Abuse: NA Previous Psychotropic Medications: No  Psychological Evaluations: No  Past Medical History:  Past Medical History:  Diagnosis Date  . Gestational diabetes    With G4 diet controlled  . Hypertension   . Incompetence of cervix   . Prior pregnancy with fetal demise   . PVC's (premature ventricular contractions)  Past Surgical History:  Procedure Laterality Date  . ABDOMINAL CERCLAGE  05/29/2011   Procedure: CERCLAGE ABDOMINAL;  Surgeon: Darlyn Chamber, MD;  Location: [facility];  Service: Gynecology;  Laterality: N/A;  need to use mersilene band  . ABDOMINAL CERCLAGE  10/20/2011   Procedure: CERCLAGE ABDOMINAL;  Surgeon: Cyril Mourning, MD;  Location: [facility];  Service: Gynecology;  Laterality: N/A;  Removal of abdominal cerclage  . CERVICAL CERCLAGE    . LEEP    . TUBAL LIGATION     Family History:  Family History   Problem Relation Age of Onset  . Diabetes Mother   . Diabetes Father   . Heart disease Father   . Hypertension Father     Tobacco Screening: Have you used any form of tobacco in the last 30 days? (Cigarettes, Smokeless Tobacco, Cigars, and/or Pipes): No Social History:  Social History   Substance and Sexual Activity  Alcohol Use No     Social History   Substance and Sexual Activity  Drug Use No    Additional Social History:                           Allergies:  No Known Allergies Lab Results:  Results for orders placed or performed during the hospital encounter of 06/15/18 (from the past 48 hour(s))  Comprehensive metabolic panel     Status: Abnormal   Collection Time: 06/15/18  3:34 PM  Result Value Ref Range   Sodium 137 135 - 145 mmol/L   Potassium 4.8 3.5 - 5.1 mmol/L   Chloride 104 98 - 111 mmol/L   CO2 24 22 - 32 mmol/L   Glucose, Bld 96 70 - 99 mg/dL   BUN 16 6 - 20 mg/dL   Creatinine, Ser 0.70 0.44 - 1.00 mg/dL   Calcium 9.5 8.9 - 10.3 mg/dL   Total Protein 8.2 (H) 6.5 - 8.1 g/dL   Albumin 4.3 3.5 - 5.0 g/dL   AST 33 15 - 41 U/L   ALT 13 0 - 44 U/L   Alkaline Phosphatase 61 38 - 126 U/L   Total Bilirubin 1.2 0.3 - 1.2 mg/dL   GFR calc non Af Amer >60 >60 mL/min   GFR calc Af Amer >60 >60 mL/min   Anion gap 9 5 - 15    Comment: Performed at [facility], [place] [address] [place], [place] [address]  Ethanol     Status: None   Collection Time: 06/15/18  3:34 PM  Result Value Ref Range   Alcohol, Ethyl (B) <10 <10 mg/dL    Comment: (NOTE) Lowest detectable limit for serum alcohol is 10 mg/dL. For medical purposes only. Performed at [facility], [place] [address] [place], [place] [address]   CBC with Diff     Status: None   Collection Time: 06/15/18  3:34 PM  Result Value Ref Range   WBC 7.5 4.0 - 10.5 K/uL   RBC 4.72 3.87 - 5.11 MIL/uL   Hemoglobin 13.3 12.0 - 15.0 g/dL   HCT 41.0 36.0 - 46.0 %   MCV  86.9 80.0 - 100.0 fL   MCH 28.2 26.0 - 34.0 pg   MCHC 32.4 30.0 - 36.0 g/dL   RDW 14.2 11.5 - 15.5 %   Platelets 312 150 - 400 K/uL   nRBC 0.0 0.0 - 0.2 %   Neutrophils Relative % 61 %   Neutro Abs 4.5 1.7 -  7.7 K/uL   Lymphocytes Relative 30 %   Lymphs Abs 2.3 0.7 - 4.0 K/uL   Monocytes Relative 8 %   Monocytes Absolute 0.6 0.1 - 1.0 K/uL   Eosinophils Relative 1 %   Eosinophils Absolute 0.1 0.0 - 0.5 K/uL   Basophils Relative 0 %   Basophils Absolute 0.0 0.0 - 0.1 K/uL   Immature Granulocytes 0 %   Abs Immature Granulocytes 0.03 0.00 - 0.07 K/uL    Comment: Performed at [facility], [place] [address], [place], [place] [address]  Salicylate level     Status: None   Collection Time: 06/15/18  3:34 PM  Result Value Ref Range   Salicylate Lvl <0.1 2.8 - 30.0 mg/dL    Comment: Performed at [facility], [place] [address] [place], [place] [address]  Acetaminophen level     Status: Abnormal   Collection Time: 06/15/18  3:34 PM  Result Value Ref Range   Acetaminophen (Tylenol), Serum <10 (L) 10 - 30 ug/mL    Comment: (NOTE) Therapeutic concentrations vary significantly. A range of 10-30 ug/mL  may be an effective concentration for many patients. However, some  are best treated at concentrations outside of this range. Acetaminophen concentrations >150 ug/mL at 4 hours after ingestion  and >50 ug/mL at 12 hours after ingestion are often associated with  toxic reactions. Performed at [facility], [place] [address] [place],  [address]   CBG monitoring, ED     Status: None   Collection Time: 06/15/18  3:58 PM  Result Value Ref Range   Glucose-Capillary 88 70 - 99 mg/dL  I-Stat beta hCG blood, ED     Status: None   Collection Time: 06/15/18  4:00 PM  Result Value Ref Range   I-stat hCG, quantitative <5.0 <5 mIU/mL   Comment 3            Comment:   GEST. AGE      CONC.  (mIU/mL)   <=1 WEEK        5 - 50     2 WEEKS        50 - 500     3 WEEKS       100 - 10,000     4 WEEKS     1,000 - 30,000        FEMALE AND NON-PREGNANT FEMALE:     LESS THAN 5 mIU/mL   I-stat Creatinine, ED     Status: None   Collection Time: 06/15/18  4:01 PM  Result Value Ref Range   Creatinine, Ser 0.50 0.44 - 1.00 mg/dL  POCT I-Stat EG7     Status: Abnormal   Collection Time: 06/15/18  4:01 PM  Result Value Ref Range   pH, Ven 7.443 (H) 7.250 - 7.430   pCO2, Ven 40.8 (L) 44.0 - 60.0 mmHg   pO2, Ven 70.0 (H) 32.0 - 45.0 mmHg   Bicarbonate 27.9 20.0 - 28.0 mmol/L   TCO2 29 22 - 32 mmol/L   O2 Saturation 94.0 %   Acid-Base Excess 3.0 (H) 0.0 - 2.0 mmol/L   Sodium 138 135 - 145 mmol/L   Potassium 5.2 (H) 3.5 - 5.1 mmol/L   Calcium, Ion 1.18 1.15 - 1.40 mmol/L   HCT 41.0 36.0 - 46.0 %   Hemoglobin 13.9 12.0 - 15.0 g/dL   Patient temperature HIDE    Sample type VENOUS   Urine rapid drug screen (hosp performed)  Status: None   Collection Time: 06/15/18  4:13 PM  Result Value Ref Range   Opiates NONE DETECTED NONE DETECTED   Cocaine NONE DETECTED NONE DETECTED   Benzodiazepines NONE DETECTED NONE DETECTED   Amphetamines NONE DETECTED NONE DETECTED   Tetrahydrocannabinol NONE DETECTED NONE DETECTED   Barbiturates NONE DETECTED NONE DETECTED    Comment: (NOTE) DRUG SCREEN FOR MEDICAL PURPOSES ONLY.  IF CONFIRMATION IS NEEDED FOR ANY PURPOSE, NOTIFY LAB WITHIN 5 DAYS. LOWEST DETECTABLE LIMITS FOR URINE DRUG SCREEN Drug Class                     Cutoff (ng/mL) Amphetamine and metabolites    1000 Barbiturate and metabolites    200 Benzodiazepine                 202 Tricyclics and metabolites     300 Opiates and metabolites        300 Cocaine and metabolites        300 THC                            50 Performed at [facility], [place] [address] [place], [place] [address]   Urinalysis, Routine w reflex microscopic     Status: Abnormal   Collection Time: 06/15/18  4:13 PM  Result Value Ref Range    Color, Urine STRAW (A) YELLOW   APPearance CLEAR CLEAR   Specific Gravity, Urine 1.006 1.005 - 1.030   pH 6.0 5.0 - 8.0   Glucose, UA NEGATIVE NEGATIVE mg/dL   Hgb urine dipstick SMALL (A) NEGATIVE   Bilirubin Urine NEGATIVE NEGATIVE   Ketones, ur NEGATIVE NEGATIVE mg/dL   Protein, ur NEGATIVE NEGATIVE mg/dL   Nitrite NEGATIVE NEGATIVE   Leukocytes,Ua NEGATIVE NEGATIVE   RBC / HPF 0-5 0 - 5 RBC/hpf   Bacteria, UA NONE SEEN NONE SEEN   Squamous Epithelial / LPF 0-5 0 - 5   Mucus PRESENT     Comment: Performed at [facility], [place] [address], [place], [place] [address]  Acetaminophen level     Status: Abnormal   Collection Time: 06/15/18  8:39 PM  Result Value Ref Range   Acetaminophen (Tylenol), Serum <10 (L) 10 - 30 ug/mL    Comment: (NOTE) Therapeutic concentrations vary significantly. A range of 10-30 ug/mL  may be an effective concentration for many patients. However, some  are best treated at concentrations outside of this range. Acetaminophen concentrations >150 ug/mL at 4 hours after ingestion  and >50 ug/mL at 12 hours after ingestion are often associated with  toxic reactions. Performed at [facility], [place] [address], [place],  [address]     Blood Alcohol level:  Lab Results  Component Value Date   ETH <10 17/61/6073    Metabolic Disorder Labs:  No results found for: HGBA1C, MPG No results found for: PROLACTIN No results found for: CHOL, TRIG, HDL, CHOLHDL, VLDL, LDLCALC  Current Medications: No current facility-administered medications for this encounter.    PTA Medications: Medications Prior to Admission  Medication Sig Dispense Refill Last Dose  . amitriptyline (ELAVIL) 10 MG tablet Take 10 mg by mouth daily.   06/15/2018 at 7:00am  . cyclobenzaprine (FLEXERIL) 10 MG tablet Take 1 tablet (10 mg total) by mouth 2 (two) times daily as needed for muscle spasms. (Patient not taking: Reported on 12/17/2017) 20 tablet  0 Not Taking at  Unknown time  . HYDROcodone-acetaminophen (NORCO/VICODIN) 5-325 MG tablet Take 2 tablets by mouth every 6 (six) hours as needed. (Patient not taking: Reported on 06/15/2018) 6 tablet 0 Not Taking at Unknown time  . lisinopril (PRINIVIL,ZESTRIL) 10 MG tablet Take 10 mg by mouth daily.   06/15/2018 at 7:00am  . methocarbamol (ROBAXIN) 500 MG tablet Take 1 tablet (500 mg total) by mouth 2 (two) times daily. (Patient not taking: Reported on 12/17/2017) 20 tablet 0 Not Taking at Unknown time  . naproxen (NAPROSYN) 500 MG tablet Take 1 tablet (500 mg total) by mouth 2 (two) times daily. (Patient not taking: Reported on 12/17/2017) 30 tablet 0 Not Taking at Unknown time  . oseltamivir (TAMIFLU) 75 MG capsule Take 1 capsule (75 mg total) by mouth every 12 (twelve) hours. (Patient not taking: Reported on 12/17/2017) 10 capsule 0 Not Taking at Unknown time    Musculoskeletal: Strength & Muscle Tone: within normal limits Gait & Station: normal Patient leans: N/A  Psychiatric Specialty Exam: Physical Exam  ROS  Blood pressure 111/82, pulse 96, temperature 98.1 F (36.7 C), temperature source Oral, resp. rate 20, height 5\' 5"  (1.651 m), weight 81.2 kg.Body mass index is 29.79 kg/m.  General Appearance: Casual  Eye Contact:  Good  Speech:  Clear and Coherent  Volume:  Normal  Mood:  Euthymic  Affect:  Congruent  Thought Process:  Goal Directed  Orientation:  Full (Time, Place, and Person)  Thought Content:  Logical  Suicidal Thoughts:  No  Homicidal Thoughts:  No  Memory:  Immediate;   Good  Judgement:  Good  Insight:  Good  Psychomotor Activity:  Normal  Concentration:  Concentration: Good  Recall:  Good  Fund of Knowledge:  Good  Language:  Good  Akathisia:  Negative  Handed:  Right  AIMS (if indicated):     Assets:  Leisure Time Physical Health  ADL's:  Intact  Cognition:  WNL  Sleep:  Number of Hours: 6.5    Treatment Plan Summary: Daily contact with patient to  assess and evaluate symptoms and progress in treatment, Medication management and Plan See discharge summary  Observation Level/Precautions:  15 minute checks  Laboratory:  UDS  Psychotherapy:    Medications:    Consultations:    Discharge Concerns:    Estimated LOS:  Other:     Physician Treatment Plan for Primary Diagnosis: <principal problem not specified> Long Term Goal(s): Improvement in symptoms so as ready for discharge  Short Term Goals: Ability to identify changes in lifestyle to reduce recurrence of condition will improve, Ability to verbalize feelings will improve and Ability to disclose and discuss suicidal ideas  Physician Treatment Plan for Secondary Diagnosis: Active Problems:   MDD (major depressive disorder), severe ([place])  Long Term Goal(s): Improvement in symptoms so as ready for discharge  Short Term Goals: Ability to maintain clinical measurements within normal limits will improve, Compliance with prescribed medications will improve and Ability to identify triggers associated with substance abuse/mental health issues will improve  I certify that inpatient services furnished can reasonably be expected to improve the patient's condition.    Johnn Hai, MD 2/13/20209:39 AM

## 2018-06-21 ENCOUNTER — Telehealth: Payer: Self-pay | Admitting: *Deleted

## 2018-06-21 NOTE — Telephone Encounter (Signed)
Telephoned patient, left a message to return a call to [place] regarding a mammo scholarship.

## 2018-06-28 ENCOUNTER — Other Ambulatory Visit (HOSPITAL_COMMUNITY): Payer: Self-pay | Admitting: *Deleted

## 2018-06-28 DIAGNOSIS — R921 Mammographic calcification found on diagnostic imaging of breast: Secondary | ICD-10-CM

## 2018-06-28 DIAGNOSIS — Z1231 Encounter for screening mammogram for malignant neoplasm of breast: Secondary | ICD-10-CM

## 2018-09-30 ENCOUNTER — Ambulatory Visit (HOSPITAL_COMMUNITY)
Admission: RE | Admit: 2018-09-30 | Discharge: 2018-09-30 | Disposition: A | Discharge status: Home / Self Care | Payer: Self-pay | Source: Ambulatory Visit | Attending: Obstetrics and Gynecology | Admitting: Obstetrics and Gynecology

## 2018-09-30 ENCOUNTER — Other Ambulatory Visit (HOSPITAL_COMMUNITY): Payer: Self-pay | Admitting: Obstetrics and Gynecology

## 2018-09-30 ENCOUNTER — Other Ambulatory Visit: Payer: Self-pay

## 2018-09-30 ENCOUNTER — Ambulatory Visit
Admission: RE | Admit: 2018-09-30 | Discharge: 2018-09-30 | Disposition: A | Discharge status: Home / Self Care | Payer: No Typology Code available for payment source | Source: Ambulatory Visit | Attending: Obstetrics and Gynecology | Admitting: Obstetrics and Gynecology

## 2018-09-30 ENCOUNTER — Encounter (HOSPITAL_COMMUNITY): Payer: Self-pay

## 2018-09-30 VITALS — BP 112/78 | Temp 98.6°F | Wt 186.0 lb

## 2018-09-30 DIAGNOSIS — R921 Mammographic calcification found on diagnostic imaging of breast: Secondary | ICD-10-CM

## 2018-09-30 DIAGNOSIS — Z1239 Encounter for other screening for malignant neoplasm of breast: Secondary | ICD-10-CM

## 2018-09-30 NOTE — Progress Notes (Signed)
Patient referred to [facility] by the [place] due to recommending 4-month follow-up left breast. Last left breast diagnostic mammogram completed 07/24/2017.   Pap Smear: Pap smear not completed today. Last Pap smear was in February 2019 at [facility] and normal per patient. Per patient has a history of an abnormal Pap smear over 20 years ago that a colposcopy and LEEP were completed for follow-up. Patient states all Pap smears have been normal since LEEP and has had Pap smears almost every year since. No Pap smear results are in Epic.  Physical exam: Breasts Breasts symmetrical. No skin abnormalities bilateral breasts. No nipple retraction bilateral breasts. No nipple discharge bilateral breasts. No lymphadenopathy. No lumps palpated bilateral breasts. No complaints of pain or tenderness on exam. Referred patient to the [facility] for a diagnostic mammogram per recommendation. Appointment scheduled for Thursday, Sep 30, 2018 at 1050.        Pelvic/Bimanual No Pap smear completed today since last Pap smear was in February 2019 per patient. Pap smear not indicated per BCCCP guidelines.   Smoking History: Patient has never smoked.  Patient Navigation: Patient education provided. Access to services provided for patient through BCCCP program.   Breast and Cervical Cancer Risk Assessment: Patient has no family history of breast cancer, known genetic mutations, or radiation treatment to the chest before age 76. Per patient has a history of cervical dysplasia. Patient has no history of being immunocompromised or DES exposure in-utero.  Risk Assessment    Risk Scores      09/30/2018   Last edited by: Armond Hang, LPN   5-year risk: 0.8 %   Lifetime risk: 9.4 %

## 2018-09-30 NOTE — Patient Instructions (Signed)
Explained breast self awareness with Adonis Brook. Patient did not need a Pap smear today due to last Pap smear was in February 2019 per patient. Let her know BCCCP will cover Pap smears every 3 years unless has a history of abnormal Pap smears. Referred patient to the [facility] for a diagnostic mammogram per recommendation. Appointment scheduled for Thursday, Sep 30, 2018 at 1050. Patient aware of appointment and will be there. Catherin Hal Morales verbalized understanding.  Shila Kruczek, Arvil Chaco, RN 8:52 AM

## 2018-10-04 ENCOUNTER — Encounter (HOSPITAL_COMMUNITY): Payer: Self-pay | Admitting: *Deleted

## 2018-10-05 ENCOUNTER — Other Ambulatory Visit: Payer: Self-pay

## 2018-10-05 ENCOUNTER — Ambulatory Visit
Admission: RE | Admit: 2018-10-05 | Discharge: 2018-10-05 | Disposition: A | Discharge status: Home / Self Care | Payer: No Typology Code available for payment source | Source: Ambulatory Visit | Attending: Obstetrics and Gynecology | Admitting: Obstetrics and Gynecology

## 2018-10-05 DIAGNOSIS — R921 Mammographic calcification found on diagnostic imaging of breast: Secondary | ICD-10-CM

## 2018-10-13 ENCOUNTER — Other Ambulatory Visit: Payer: Self-pay | Admitting: Surgery

## 2018-10-13 DIAGNOSIS — N6022 Fibroadenosis of left breast: Secondary | ICD-10-CM

## 2018-10-15 ENCOUNTER — Other Ambulatory Visit: Payer: Self-pay | Admitting: Surgery

## 2018-10-15 DIAGNOSIS — N6022 Fibroadenosis of left breast: Secondary | ICD-10-CM

## 2018-10-27 ENCOUNTER — Other Ambulatory Visit: Payer: Self-pay

## 2018-10-27 ENCOUNTER — Encounter (HOSPITAL_BASED_OUTPATIENT_CLINIC_OR_DEPARTMENT_OTHER): Payer: Self-pay | Admitting: *Deleted

## 2018-10-29 NOTE — Progress Notes (Signed)
Pt given Ensure drink with instructions on completion DOS.  NPO otherwise. Pt verbalized understanding

## 2018-11-01 ENCOUNTER — Other Ambulatory Visit (HOSPITAL_COMMUNITY)
Admission: RE | Admit: 2018-11-01 | Discharge: 2018-11-01 | Disposition: A | Discharge status: Home / Self Care | Payer: HRSA Program | Source: Ambulatory Visit | Attending: Surgery | Admitting: Surgery

## 2018-11-01 DIAGNOSIS — Z01812 Encounter for preprocedural laboratory examination: Secondary | ICD-10-CM | POA: Insufficient documentation

## 2018-11-01 DIAGNOSIS — Z1159 Encounter for screening for other viral diseases: Secondary | ICD-10-CM | POA: Insufficient documentation

## 2018-11-01 LAB — SARS CORONAVIRUS 2 (TAT 6-24 HRS): SARS Coronavirus 2: NEGATIVE

## 2018-11-03 ENCOUNTER — Other Ambulatory Visit: Payer: Self-pay

## 2018-11-03 ENCOUNTER — Ambulatory Visit
Admission: RE | Admit: 2018-11-03 | Discharge: 2018-11-03 | Disposition: A | Discharge status: Home / Self Care | Payer: No Typology Code available for payment source | Source: Ambulatory Visit | Attending: Surgery | Admitting: Surgery

## 2018-11-03 DIAGNOSIS — N6022 Fibroadenosis of left breast: Secondary | ICD-10-CM

## 2018-11-03 NOTE — H&P (Signed)
Adonis Brook Documented: 10/13/2018 11:23 AM Location: [facility] Patient #: [id number] DOB: Mar 14, 1976 Married / Language: English / Race: Black or African American Female   History of Present Illness (Jennifer Haas A. Ninfa Linden MD; 10/13/2018 11:49 AM) The patient is a 43 year old female who presents with a complaint of Breast problems. This patient is referred by Dr. Mora Bellman after the recent diagnosis of a complex sclerosing lesion and papilloma of the left breast. She had increasing calcifications in the left breast seen on mammography. She underwent a stereotactic biopsy of a 1.4 cm cluster which showed a complex sclerosing lesion with an intraductal papilloma. Surgical excision of this area is been recommended. She has no previous history of breast surgery or need for breast biopsies. There is no family history of breast cancer. She is otherwise healthy without complaints.   Past Surgical History Malachi Bonds, CMA; 10/13/2018 11:23 AM) Cesarean Section - 1   Diagnostic Studies History Malachi Bonds, CMA; 10/13/2018 11:23 AM) Colonoscopy  never Mammogram  within last year Pap Smear  1-5 years ago  Allergies Malachi Bonds, CMA; 10/13/2018 11:23 AM) No Known Drug Allergies  [10/13/2018]:  Medication History Malachi Bonds, CMA; 10/13/2018 11:24 AM) Lisinopril-hydroCHLOROthiazide (10-12.5MG  Tablet, Oral) Active. Vitamin D (Ergocalciferol) (1.25 MG(50000 UT) Capsule, Oral) Active. Medications Reconciled  Social History Malachi Bonds, CMA; 10/13/2018 11:23 AM) Alcohol use  Occasional alcohol use. Caffeine use  Tea. No drug use  Tobacco use  Never smoker.  Family History Malachi Bonds, CMA; 10/13/2018 11:23 AM) Diabetes Mellitus  Brother, Father. Heart Disease  Father. Hypertension  Father, Mother.  Pregnancy / Birth History Malachi Bonds, CMA; 10/13/2018 11:23 AM) Age at menarche  74 years. Gravida  8 Maternal age  34-20 Para   4 Regular periods   Other Problems Malachi Bonds, CMA; 10/13/2018 11:23 AM) Heart murmur  High blood pressure     Review of Systems (Chemira Jones CMA; 10/13/2018 11:23 AM) General Not Present- Appetite Loss, Chills, Fatigue, Fever, Night Sweats, Weight Gain and Weight Loss. Skin Not Present- Change in Wart/Mole, Dryness, Hives, Jaundice, New Lesions, Non-Healing Wounds, Rash and Ulcer. HEENT Not Present- Earache, Hearing Loss, Hoarseness, Nose Bleed, Oral Ulcers, Ringing in the Ears, Seasonal Allergies, Sinus Pain, Sore Throat, Visual Disturbances, Wears glasses/contact lenses and Yellow Eyes. Respiratory Not Present- Bloody sputum, Chronic Cough, Difficulty Breathing, Snoring and Wheezing. Cardiovascular Not Present- Chest Pain, Difficulty Breathing Lying Down, Leg Cramps, Palpitations, Rapid Heart Rate, Shortness of Breath and Swelling of Extremities. Gastrointestinal Not Present- Abdominal Pain, Bloating, Bloody Stool, Change in Bowel Habits, Chronic diarrhea, Constipation, Difficulty Swallowing, Excessive gas, Gets full quickly at meals, Hemorrhoids, Indigestion, Nausea, Rectal Pain and Vomiting. Female Genitourinary Not Present- Frequency, Nocturia, Painful Urination, Pelvic Pain and Urgency. Musculoskeletal Not Present- Back Pain, Joint Pain, Joint Stiffness, Muscle Pain, Muscle Weakness and Swelling of Extremities. Neurological Not Present- Decreased Memory, Fainting, Headaches, Numbness, Seizures, Tingling, Tremor, Trouble walking and Weakness. Psychiatric Not Present- Anxiety, Bipolar, Change in Sleep Pattern, Depression, Fearful and Frequent crying. Endocrine Not Present- Cold Intolerance, Excessive Hunger, Hair Changes, Heat Intolerance, Hot flashes and New Diabetes. Hematology Not Present- Blood Thinners, Easy Bruising, Excessive bleeding, Gland problems, HIV and Persistent Infections.  Vitals (Chemira Jones CMA; 10/13/2018 11:23 AM) 10/13/2018 11:23 AM Weight: 182.8 lb  Height: 65in Body Surface Area: 1.9 m Body Mass Index: 30.42 kg/m  Temp.: 97.22F (Oral)  Pulse: 87 (Regular)  BP: 110/70(Sitting, Left Arm, Standard)       Physical Exam (Jolynne Spurgin A. Ninfa Linden MD;  10/13/2018 11:50 AM) General Mental Status-Alert. General Appearance-Consistent with stated age. Hydration-Well hydrated. Voice-Normal.  Head and Neck Head-normocephalic, atraumatic with no lesions or palpable masses. Trachea-midline. Thyroid Gland Characteristics - normal size and consistency.  Eye Eyeball - Bilateral-Extraocular movements intact. Sclera/Conjunctiva - Bilateral-No scleral icterus.  Chest and Lung Exam Chest and lung exam reveals -quiet, even and easy respiratory effort with no use of accessory muscles and on auscultation, normal breath sounds, no adventitious sounds and normal vocal resonance. Inspection Chest Wall - Normal. Back - normal.  Breast Breast - Left-Symmetric, Non Tender, No Biopsy scars, no Dimpling, No Inflammation, No Lumpectomy scars, No Mastectomy scars, No Peau d' Orange. Breast - Right-Symmetric, Non Tender, No Biopsy scars, no Dimpling, No Inflammation, No Lumpectomy scars, No Mastectomy scars, No Peau d' Orange. Breast Lump-No Palpable Breast Mass. Note: There are no masses in the breast. Her small stereotactic biopsy site on the left breast is well healed   Cardiovascular Cardiovascular examination reveals -normal heart sounds, regular rate and rhythm with no murmurs and normal pedal pulses bilaterally.  Neurologic - Did not examine.  Musculoskeletal - Did not examine.  Lymphatic Head & Neck  General Head & Neck Lymphatics: Bilateral - Description - Normal. Axillary  General Axillary Region: Bilateral - Description - Normal. Tenderness - Non Tender. Femoral & Inguinal - Did not examine.    Assessment & Plan (Caysie Minnifield A. Ninfa Linden MD; 10/13/2018 11:51 AM)  PAPILLOMA OF LEFT BREAST  (D24.2) Impression: This is a patient with the above complex sclerosing lesion and intraductal papilloma together in the left breast. Surgical excision of this area is recommended to rule out malignancy. I have reviewed her mammograms and ultrasound. I reviewed her pathology results and gave her a copy of this. We discussed the reasons for removal of this to rule out cancer. I discussed the surgical procedure in detail. I discussed proceeding with a left breast radioactive seed guided lumpectomy. I discussed the risks of the procedure which includes but is not limited to bleeding, infection, the need for further surgery if malignancy is found, injury to surrounding structures, postoperative recovery, etc. She understands and wishes to proceed with surgery which will be scheduled   SCLEROSING ADENOSIS OF BREAST, LEFT (N60.22)

## 2018-11-04 ENCOUNTER — Ambulatory Visit (HOSPITAL_BASED_OUTPATIENT_CLINIC_OR_DEPARTMENT_OTHER): Payer: Medicaid Other | Admitting: Certified Registered"

## 2018-11-04 ENCOUNTER — Ambulatory Visit
Admission: RE | Admit: 2018-11-04 | Discharge: 2018-11-04 | Disposition: A | Discharge status: Home / Self Care | Payer: No Typology Code available for payment source | Source: Ambulatory Visit | Attending: Surgery | Admitting: Surgery

## 2018-11-04 ENCOUNTER — Encounter (HOSPITAL_BASED_OUTPATIENT_CLINIC_OR_DEPARTMENT_OTHER)
Admission: RE | Disposition: A | Discharge status: Home / Self Care | Payer: Self-pay | Source: Home / Self Care | Attending: Surgery

## 2018-11-04 ENCOUNTER — Ambulatory Visit (HOSPITAL_BASED_OUTPATIENT_CLINIC_OR_DEPARTMENT_OTHER)
Admission: RE | Admit: 2018-11-04 | Discharge: 2018-11-04 | Disposition: A | Discharge status: Home / Self Care | Payer: Medicaid Other | Attending: Surgery | Admitting: Surgery

## 2018-11-04 ENCOUNTER — Encounter (HOSPITAL_BASED_OUTPATIENT_CLINIC_OR_DEPARTMENT_OTHER): Payer: Self-pay | Admitting: *Deleted

## 2018-11-04 ENCOUNTER — Other Ambulatory Visit: Payer: Self-pay

## 2018-11-04 DIAGNOSIS — D242 Benign neoplasm of left breast: Secondary | ICD-10-CM | POA: Diagnosis not present

## 2018-11-04 DIAGNOSIS — I10 Essential (primary) hypertension: Secondary | ICD-10-CM | POA: Diagnosis not present

## 2018-11-04 DIAGNOSIS — N6012 Diffuse cystic mastopathy of left breast: Secondary | ICD-10-CM | POA: Insufficient documentation

## 2018-11-04 DIAGNOSIS — N6489 Other specified disorders of breast: Secondary | ICD-10-CM | POA: Diagnosis not present

## 2018-11-04 DIAGNOSIS — N6022 Fibroadenosis of left breast: Secondary | ICD-10-CM

## 2018-11-04 HISTORY — PX: BREAST LUMPECTOMY WITH RADIOACTIVE SEED LOCALIZATION: SHX6424

## 2018-11-04 HISTORY — DX: Suicide attempt, initial encounter: T14.91XA

## 2018-11-04 HISTORY — DX: Cardiac murmur, unspecified: R01.1

## 2018-11-04 LAB — POCT PREGNANCY, URINE: Preg Test, Ur: NEGATIVE

## 2018-11-04 SURGERY — BREAST LUMPECTOMY WITH RADIOACTIVE SEED LOCALIZATION
Anesthesia: General | Site: Breast | Laterality: Left

## 2018-11-04 MED ORDER — CEFAZOLIN SODIUM-DEXTROSE 2-4 GM/100ML-% IV SOLN
INTRAVENOUS | Status: AC
  Filled 2018-11-04: qty 100

## 2018-11-04 MED ORDER — CEFAZOLIN SODIUM-DEXTROSE 2-3 GM-%(50ML) IV SOLR
INTRAVENOUS | Status: DC | PRN
  Administered 2018-11-04: 2 g via INTRAVENOUS

## 2018-11-04 MED ORDER — FENTANYL CITRATE (PF) 100 MCG/2ML IJ SOLN
INTRAMUSCULAR | Status: AC
  Filled 2018-11-04: qty 2

## 2018-11-04 MED ORDER — TRAMADOL HCL 50 MG PO TABS: 50.0000 mg | ORAL_TABLET | Freq: Four times a day (QID) | ORAL | 0 refills | Status: DC | PRN

## 2018-11-04 MED ORDER — ACETAMINOPHEN 500 MG PO TABS
ORAL_TABLET | ORAL | Status: AC
  Filled 2018-11-04: qty 2

## 2018-11-04 MED ORDER — MIDAZOLAM HCL 2 MG/2ML IJ SOLN
1.0000 mg | INTRAMUSCULAR | Status: DC | PRN
  Administered 2018-11-04: 11:00:00 2 mg via INTRAVENOUS

## 2018-11-04 MED ORDER — BUPIVACAINE-EPINEPHRINE 0.5% -1:200000 IJ SOLN
INTRAMUSCULAR | Status: DC | PRN
  Administered 2018-11-04: 18 mL

## 2018-11-04 MED ORDER — CHLORHEXIDINE GLUCONATE CLOTH 2 % EX PADS: 6.0000 | MEDICATED_PAD | Freq: Once | CUTANEOUS | Status: DC

## 2018-11-04 MED ORDER — CELECOXIB 200 MG PO CAPS
200.0000 mg | ORAL_CAPSULE | ORAL | Status: AC
  Administered 2018-11-04: 200 mg via ORAL

## 2018-11-04 MED ORDER — ONDANSETRON HCL 4 MG/2ML IJ SOLN
INTRAMUSCULAR | Status: AC
  Filled 2018-11-04: qty 2

## 2018-11-04 MED ORDER — LIDOCAINE HCL (CARDIAC) PF 100 MG/5ML IV SOSY
PREFILLED_SYRINGE | INTRAVENOUS | Status: DC | PRN
  Administered 2018-11-04: 100 mg via INTRAVENOUS

## 2018-11-04 MED ORDER — LACTATED RINGERS IV SOLN: INTRAVENOUS | Status: DC

## 2018-11-04 MED ORDER — CELECOXIB 200 MG PO CAPS
ORAL_CAPSULE | ORAL | Status: AC
  Filled 2018-11-04: qty 1

## 2018-11-04 MED ORDER — LACTATED RINGERS IV SOLN
INTRAVENOUS | Status: DC
  Administered 2018-11-04: 10:00:00 via INTRAVENOUS

## 2018-11-04 MED ORDER — MIDAZOLAM HCL 2 MG/2ML IJ SOLN
INTRAMUSCULAR | Status: AC
  Filled 2018-11-04: qty 2

## 2018-11-04 MED ORDER — CEFAZOLIN SODIUM-DEXTROSE 2-4 GM/100ML-% IV SOLN: 2.0000 g | INTRAVENOUS | Status: DC

## 2018-11-04 MED ORDER — FENTANYL CITRATE (PF) 100 MCG/2ML IJ SOLN
50.0000 ug | INTRAMUSCULAR | Status: AC | PRN
  Administered 2018-11-04: 25 ug via INTRAVENOUS
  Administered 2018-11-04: 50 ug via INTRAVENOUS
  Administered 2018-11-04: 25 ug via INTRAVENOUS

## 2018-11-04 MED ORDER — METOCLOPRAMIDE HCL 5 MG/ML IJ SOLN: 10.0000 mg | Freq: Once | INTRAMUSCULAR | Status: DC | PRN

## 2018-11-04 MED ORDER — GABAPENTIN 300 MG PO CAPS
300.0000 mg | ORAL_CAPSULE | ORAL | Status: AC
  Administered 2018-11-04: 300 mg via ORAL

## 2018-11-04 MED ORDER — LIDOCAINE 2% (20 MG/ML) 5 ML SYRINGE
INTRAMUSCULAR | Status: AC
  Filled 2018-11-04: qty 5

## 2018-11-04 MED ORDER — PROPOFOL 10 MG/ML IV BOLUS
INTRAVENOUS | Status: DC | PRN
  Administered 2018-11-04: 150 mg via INTRAVENOUS

## 2018-11-04 MED ORDER — ACETAMINOPHEN 500 MG PO TABS
1000.0000 mg | ORAL_TABLET | ORAL | Status: AC
  Administered 2018-11-04: 09:00:00 1000 mg via ORAL

## 2018-11-04 MED ORDER — MEPERIDINE HCL 25 MG/ML IJ SOLN: 6.2500 mg | INTRAMUSCULAR | Status: DC | PRN

## 2018-11-04 MED ORDER — DEXAMETHASONE SODIUM PHOSPHATE 10 MG/ML IJ SOLN
INTRAMUSCULAR | Status: AC
  Filled 2018-11-04: qty 1

## 2018-11-04 MED ORDER — FENTANYL CITRATE (PF) 100 MCG/2ML IJ SOLN
25.0000 ug | INTRAMUSCULAR | Status: DC | PRN
  Administered 2018-11-04 (×3): 50 ug via INTRAVENOUS

## 2018-11-04 MED ORDER — GABAPENTIN 300 MG PO CAPS
ORAL_CAPSULE | ORAL | Status: AC
  Filled 2018-11-04: qty 1

## 2018-11-04 MED ORDER — PHENYLEPHRINE HCL (PRESSORS) 10 MG/ML IV SOLN
INTRAVENOUS | Status: DC | PRN
  Administered 2018-11-04: 120 ug via INTRAVENOUS
  Administered 2018-11-04: 80 ug via INTRAVENOUS

## 2018-11-04 MED ORDER — 0.9 % SODIUM CHLORIDE (POUR BTL) OPTIME
TOPICAL | Status: DC | PRN
  Administered 2018-11-04: 200 mL

## 2018-11-04 MED ORDER — ONDANSETRON HCL 4 MG/2ML IJ SOLN
INTRAMUSCULAR | Status: DC | PRN
  Administered 2018-11-04: 4 mg via INTRAVENOUS

## 2018-11-04 MED ORDER — SCOPOLAMINE 1 MG/3DAYS TD PT72: 1.0000 | MEDICATED_PATCH | Freq: Once | TRANSDERMAL | Status: DC

## 2018-11-04 MED ORDER — DEXAMETHASONE SODIUM PHOSPHATE 10 MG/ML IJ SOLN
INTRAMUSCULAR | Status: DC | PRN
  Administered 2018-11-04: 10 mg via INTRAVENOUS

## 2018-11-04 SURGICAL SUPPLY — 47 items
APPLIER CLIP 9.375 MED OPEN (MISCELLANEOUS)
BLADE HEX COATED 2.75 (ELECTRODE) ×2 IMPLANT
BLADE SURG 15 STRL LF DISP TIS (BLADE) ×1 IMPLANT
BLADE SURG 15 STRL SS (BLADE) ×1
CANISTER SUC SOCK COL 7IN (MISCELLANEOUS) IMPLANT
CANISTER SUCT 1200ML W/VALVE (MISCELLANEOUS) IMPLANT
CHLORAPREP W/TINT 26 (MISCELLANEOUS) ×2 IMPLANT
CLIP APPLIE 9.375 MED OPEN (MISCELLANEOUS) IMPLANT
CLIP VESOCCLUDE SM WIDE 6/CT (CLIP) IMPLANT
COVER BACK TABLE REUSABLE LG (DRAPES) ×2 IMPLANT
COVER MAYO STAND REUSABLE (DRAPES) ×2 IMPLANT
COVER PROBE W GEL 5X96 (DRAPES) ×2 IMPLANT
COVER WAND RF STERILE (DRAPES) IMPLANT
DECANTER SPIKE VIAL GLASS SM (MISCELLANEOUS) IMPLANT
DERMABOND ADVANCED (GAUZE/BANDAGES/DRESSINGS) ×1
DERMABOND ADVANCED .7 DNX12 (GAUZE/BANDAGES/DRESSINGS) ×1 IMPLANT
DRAPE LAPAROSCOPIC ABDOMINAL (DRAPES) ×2 IMPLANT
DRAPE UTILITY XL STRL (DRAPES) ×2 IMPLANT
ELECT REM PT RETURN 9FT ADLT (ELECTROSURGICAL) ×2
ELECTRODE REM PT RTRN 9FT ADLT (ELECTROSURGICAL) ×1 IMPLANT
GAUZE SPONGE 4X4 12PLY STRL LF (GAUZE/BANDAGES/DRESSINGS) IMPLANT
GLOVE BIOGEL PI IND STRL 6.5 (GLOVE) IMPLANT
GLOVE BIOGEL PI INDICATOR 6.5 (GLOVE) ×3
GLOVE ECLIPSE 6.5 STRL STRAW (GLOVE) ×1 IMPLANT
GLOVE SURG SIGNA 7.5 PF LTX (GLOVE) ×2 IMPLANT
GLOVE SURG SS PI 6.5 STRL IVOR (GLOVE) ×1 IMPLANT
GOWN STRL REUS W/ TWL LRG LVL3 (GOWN DISPOSABLE) ×1 IMPLANT
GOWN STRL REUS W/ TWL XL LVL3 (GOWN DISPOSABLE) ×1 IMPLANT
GOWN STRL REUS W/TWL LRG LVL3 (GOWN DISPOSABLE) ×1
GOWN STRL REUS W/TWL XL LVL3 (GOWN DISPOSABLE) ×1
KIT MARKER MARGIN INK (KITS) ×2 IMPLANT
NDL HYPO 25X1 1.5 SAFETY (NEEDLE) ×1 IMPLANT
NEEDLE HYPO 25X1 1.5 SAFETY (NEEDLE) ×2 IMPLANT
NS IRRIG 1000ML POUR BTL (IV SOLUTION) ×1 IMPLANT
PACK BASIN DAY SURGERY FS (CUSTOM PROCEDURE TRAY) ×2 IMPLANT
PENCIL BUTTON HOLSTER BLD 10FT (ELECTRODE) ×2 IMPLANT
SLEEVE SCD COMPRESS KNEE MED (MISCELLANEOUS) ×2 IMPLANT
SPONGE LAP 4X18 RFD (DISPOSABLE) ×2 IMPLANT
SUT MNCRL AB 4-0 PS2 18 (SUTURE) ×2 IMPLANT
SUT SILK 2 0 SH (SUTURE) IMPLANT
SUT VIC AB 3-0 SH 27 (SUTURE) ×1
SUT VIC AB 3-0 SH 27X BRD (SUTURE) ×1 IMPLANT
SYR CONTROL 10ML LL (SYRINGE) ×2 IMPLANT
TOWEL GREEN STERILE FF (TOWEL DISPOSABLE) ×2 IMPLANT
TRAY FAXITRON CT DISP (TRAY / TRAY PROCEDURE) ×2 IMPLANT
TUBE CONNECTING 20X1/4 (TUBING) ×1 IMPLANT
YANKAUER SUCT BULB TIP NO VENT (SUCTIONS) IMPLANT

## 2018-11-04 NOTE — Op Note (Signed)
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  Stereotactic biopsy showed both a papilloma and a complex sclerosing lesion.  A radioactive seed guided left breast lumpectomy was recommended  Procedure: The patient was brought to the operating room and identifies correct patient.  She was placed upon the operating table general anesthesia was induced.  Her left breast was then prepped and draped in usual sterile fashion.  I located the seed with the aid of the neoprobe in the upper outer quadrant of the left breast.  This was quite far from the areola so I made an incision in the axilla after anesthetizing skin with marcaine.  I then dissected toward the radioactive seed with a low neoprobe and using the electrocautery.  I then performed a lumpectomy staying widely around the radioactive seed with the aid of the neoprobe and the cautery.  Once the lumpectomy specimen was completely removed, I marked all margins with marker paint and then x-rayed the specimen.  This confirmed that both the radioactive seed and previous biopsy marker were in the specimen.  This was a sent to pathology for evaluation.  I placed a surgical clip in the biopsy cavity.  I anesthetized it further with Marcaine.  Hemostasis to be achieved.  I then closed the subcutaneous tissue with interrupted 3-0 Vicryl sutures and closed the skin with a running 4-0 Monocryl.   Dermabond was then applied.  The patient tolerated the procedure well.  All the counts were correct at the end of the procedure.  The patient was then extubated in the operating room and taken in stable condition to the recovery area.          Coralie Keens   Date: 11/04/2018  Time: 11:49 AM

## 2018-11-04 NOTE — Anesthesia Procedure Notes (Signed)
Procedure Name: LMA Insertion Performed by: Nayelis Bonito M, CRNA Pre-anesthesia Checklist: Patient identified, Emergency Drugs available, Suction available, Patient being monitored and Timeout performed Patient Re-evaluated:Patient Re-evaluated prior to induction Oxygen Delivery Method: Circle system utilized Preoxygenation: Pre-oxygenation with 100% oxygen Induction Type: IV induction LMA: LMA inserted LMA Size: 4.0 Tube type: Oral Number of attempts: 1 Placement Confirmation: positive ETCO2,  CO2 detector and breath sounds checked- equal and bilateral Tube secured with: Tape Dental Injury: Teeth and Oropharynx as per pre-operative assessment        

## 2018-11-04 NOTE — Discharge Instructions (Signed)
[facility] Office Phone Number [phone]  BREAST BIOPSY/ PARTIAL MASTECTOMY: POST OP INSTRUCTIONS  Always review your discharge instruction sheet given to you by the facility where your surgery was performed.  IF YOU HAVE DISABILITY OR FAMILY LEAVE FORMS, YOU MUST BRING THEM TO THE OFFICE FOR PROCESSING.  DO NOT GIVE THEM TO YOUR DOCTOR.  1. A prescription for pain medication may be given to you upon discharge.  Take your pain medication as prescribed, if needed.  If narcotic pain medicine is not needed, then you may take acetaminophen (Tylenol) or ibuprofen (Advil) as needed. 2. Take your usually prescribed medications unless otherwise directed 3. If you need a refill on your pain medication, please contact your pharmacy.  They will contact our office to request authorization.  Prescriptions will not be filled after 5pm or on week-ends. 4. You should eat very light the first 24 hours after surgery, such as soup, crackers, pudding, etc.  Resume your normal diet the day after surgery. 5. Most patients will experience some swelling and bruising in the breast.  Ice packs and a good support bra will help.  Swelling and bruising can take several days to resolve.  6. It is common to experience some constipation if taking pain medication after surgery.  Increasing fluid intake and taking a stool softener will usually help or prevent this problem from occurring.  A mild laxative (Milk of Magnesia or Miralax) should be taken according to package directions if there are no bowel movements after 48 hours. 7. Unless discharge instructions indicate otherwise, you may remove your bandages 24-48 hours after surgery, and you may shower at that time.  You may have steri-strips (small skin tapes) in place directly over the incision.  These strips should be left on the skin for 7-10 days.  If your surgeon used skin glue on the incision, you may shower in 24 hours.  The glue will flake off over the  next 2-3 weeks.  Any sutures or staples will be removed at the office during your follow-up visit. 8. ACTIVITIES:  You may resume regular daily activities (gradually increasing) beginning the next day.  Wearing a good support bra or sports bra minimizes pain and swelling.  You may have sexual intercourse when it is comfortable. a. You may drive when you no longer are taking prescription pain medication, you can comfortably wear a seatbelt, and you can safely maneuver your car and apply brakes. b. RETURN TO WORK:  __OK TO RETURN ON 11/08/18 c. ____________________________________________________________________________________ 9. You should see your doctor in the office for a follow-up appointment approximately two weeks after your surgery.  Your doctors nurse will typically make your follow-up appointment when she calls you with your pathology report.  Expect your pathology report 2-3 business days after your surgery.  You may call to check if you do not hear from [place] after three days. 10. OTHER INSTRUCTIONS:OK TO SHOWER STARTING TOMORROW 11. ICE PACK,IBUPROFEN, TYLENOL ALSO FOR PAIN _______________________________________________________________________________________________ _____________________________________________________________________________________________________________________________________ _____________________________________________________________________________________________________________________________________ _____________________________________________________________________________________________________________________________________  WHEN TO CALL YOUR DOCTOR: 1. Fever over 101.0 2. Nausea and/or vomiting. 3. Extreme swelling or bruising. 4. Continued bleeding from incision. 5. Increased pain, redness, or drainage from the incision.  The clinic staff is available to answer your questions during regular business hours.  Please dont hesitate to call and ask to  speak to one of the nurses for clinical concerns.  If you have a medical emergency, go to the nearest emergency room or call 911.  A surgeon from [facility] is always on call at the hospital.  For further questions, please visit [email or web address]     Post Anesthesia Home Care Instructions  Activity: Get plenty of rest for the remainder of the day. A responsible individual must stay with you for 24 hours following the procedure.  For the next 24 hours, DO NOT: -Drive a car -Paediatric nurse -Drink alcoholic beverages -Take any medication unless instructed by your physician -Make any legal decisions or sign important papers.  Meals: Start with liquid foods such as gelatin or soup. Progress to regular foods as tolerated. Avoid greasy, spicy, heavy foods. If nausea and/or vomiting occur, drink only clear liquids until the nausea and/or vomiting subsides. Call your physician if vomiting continues.  Special Instructions/Symptoms: Your throat may feel dry or sore from the anesthesia or the breathing tube placed in your throat during surgery. If this causes discomfort, gargle with warm salt water. The discomfort should disappear within 24 hours.  If you had a scopolamine patch placed behind your ear for the management of post- operative nausea and/or vomiting:  1. The medication in the patch is effective for 72 hours, after which it should be removed.  Wrap patch in a tissue and discard in the trash. Wash hands thoroughly with soap and water. 2. You may remove the patch earlier than 72 hours if you experience unpleasant side effects which may include dry mouth, dizziness or visual disturbances. 3. Avoid touching the patch. Wash your hands with soap and water after contact with the patch.

## 2018-11-04 NOTE — Anesthesia Preprocedure Evaluation (Signed)
Anesthesia Evaluation  Patient identified by MRN, date of birth, ID band Patient awake    Reviewed: Allergy & Precautions, NPO status , Patient's Chart, lab work & pertinent test results  Airway Mallampati: II  TM Distance: >3 FB Neck ROM: Full    Dental no notable dental hx.    Pulmonary neg pulmonary ROS,    Pulmonary exam normal breath sounds clear to auscultation       Cardiovascular hypertension, Pt. on medications Normal cardiovascular exam Rhythm:Regular Rate:Normal     Neuro/Psych negative neurological ROS  negative psych ROS   GI/Hepatic negative GI ROS, Neg liver ROS,   Endo/Other  negative endocrine ROS  Renal/GU negative Renal ROS  negative genitourinary   Musculoskeletal negative musculoskeletal ROS (+)   Abdominal   Peds negative pediatric ROS (+)  Hematology negative hematology ROS (+)   Anesthesia Other Findings   Reproductive/Obstetrics negative OB ROS                             Anesthesia Physical Anesthesia Plan  ASA: II  Anesthesia Plan: General   Post-op Pain Management:    Induction: Intravenous  PONV Risk Score and Plan: 3 and Ondansetron, Dexamethasone, Midazolam and Treatment may vary due to age or medical condition  Airway Management Planned: LMA  Additional Equipment:   Intra-op Plan:   Post-operative Plan:   Informed Consent: I have reviewed the patients History and Physical, chart, labs and discussed the procedure including the risks, benefits and alternatives for the proposed anesthesia with the patient or authorized representative who has indicated his/her understanding and acceptance.     Dental advisory given  Plan Discussed with: CRNA  Anesthesia Plan Comments:         Anesthesia Quick Evaluation

## 2018-11-04 NOTE — Interval H&P Note (Signed)
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  The various methods of treatment have been discussed with the patient and family. After consideration of risks, benefits and other options for treatment, the patient has consented to  Procedure(s): LEFT BREAST LUMPECTOMY WITH RADIOACTIVE SEED GUIDED LOCALIZATION (Left) as a surgical intervention.  The patient's history has been reviewed, patient examined, no change in status, stable for surgery.  I have reviewed the patient's chart and labs.  Questions were answered to the patient's satisfaction.     Coralie Keens

## 2018-11-04 NOTE — Transfer of Care (Signed)
Immediate Anesthesia Transfer of Care Note  Patient: Jennifer Haas  Procedure(s) Performed: LEFT BREAST LUMPECTOMY WITH RADIOACTIVE SEED GUIDED LOCALIZATION (Left Breast)  Patient Location: PACU  Anesthesia Type:General  Level of Consciousness: awake, alert  and oriented  Airway & Oxygen Therapy: Patient Spontanous Breathing and Patient connected to face mask oxygen  Post-op Assessment: Report given to RN and Post -op Vital signs reviewed and stable  Post vital signs: Reviewed and stable  Last Vitals:  Vitals Value Taken Time  BP    Temp    Pulse 88 11/04/18 1149  Resp    SpO2 100 % 11/04/18 1149  Vitals shown include unvalidated device data.  Last Pain:  Vitals:   11/04/18 0921  TempSrc: Oral  PainSc: 0-No pain         Complications: No apparent anesthesia complications

## 2018-11-04 NOTE — Anesthesia Postprocedure Evaluation (Signed)
Anesthesia Post Note  Patient: Jennifer Haas  Procedure(s) Performed: LEFT BREAST LUMPECTOMY WITH RADIOACTIVE SEED GUIDED LOCALIZATION (Left Breast)     Patient location during evaluation: PACU Anesthesia Type: General Level of consciousness: awake and alert Pain management: pain level controlled Vital Signs Assessment: post-procedure vital signs reviewed and stable Respiratory status: spontaneous breathing, nonlabored ventilation, respiratory function stable and patient connected to nasal cannula oxygen Cardiovascular status: blood pressure returned to baseline and stable Postop Assessment: no apparent nausea or vomiting Anesthetic complications: no    Last Vitals:  Vitals:   11/04/18 1300 11/04/18 1335  BP: 121/82 123/89  Pulse: 69 70  Resp: 14 18  Temp:  37.1 C  SpO2: 100% 100%    Last Pain:  Vitals:   11/04/18 1335  TempSrc:   PainSc: 2                  Montez Hageman

## 2018-11-08 ENCOUNTER — Encounter (HOSPITAL_BASED_OUTPATIENT_CLINIC_OR_DEPARTMENT_OTHER): Payer: Self-pay | Admitting: Surgery

## 2018-11-15 ENCOUNTER — Telehealth (HOSPITAL_COMMUNITY): Payer: Self-pay | Admitting: *Deleted

## 2018-11-15 NOTE — Telephone Encounter (Signed)
Patient called and left voicemail stating she received a bill. Called patient back and she stated she received a bill for her breast surgery. Explained to patient that [facility] will cover the surgeon fees for the surgery and that she will need to complete the [employer] Application for the facility fees. Patient stated she has access to the Internet. Explained to patient how to find the application on the Internet and the phone number for questions. Patient verbalized understanding.

## 2019-11-23 ENCOUNTER — Other Ambulatory Visit: Payer: Self-pay | Admitting: Surgery

## 2019-11-23 DIAGNOSIS — Z1231 Encounter for screening mammogram for malignant neoplasm of breast: Secondary | ICD-10-CM

## 2019-12-09 ENCOUNTER — Ambulatory Visit: Payer: Medicaid Other

## 2019-12-12 ENCOUNTER — Ambulatory Visit: Payer: Medicaid Other

## 2019-12-21 ENCOUNTER — Ambulatory Visit: Payer: Medicaid Other

## 2020-01-13 ENCOUNTER — Ambulatory Visit: Payer: Medicaid Other

## 2020-01-16 ENCOUNTER — Other Ambulatory Visit: Payer: Self-pay

## 2020-01-16 ENCOUNTER — Ambulatory Visit
Admission: RE | Admit: 2020-01-16 | Discharge: 2020-01-16 | Disposition: A | Discharge status: Home / Self Care | Payer: 59 | Source: Ambulatory Visit | Attending: Surgery | Admitting: Surgery

## 2020-01-16 DIAGNOSIS — Z1231 Encounter for screening mammogram for malignant neoplasm of breast: Secondary | ICD-10-CM

## 2020-10-18 ENCOUNTER — Encounter: Payer: 59 | Admitting: Gastroenterology

## 2020-12-10 ENCOUNTER — Other Ambulatory Visit: Payer: Self-pay | Admitting: Surgery

## 2020-12-10 ENCOUNTER — Encounter: Payer: Self-pay | Admitting: Gastroenterology

## 2020-12-10 DIAGNOSIS — Z1231 Encounter for screening mammogram for malignant neoplasm of breast: Secondary | ICD-10-CM

## 2021-01-29 ENCOUNTER — Ambulatory Visit
Admission: RE | Admit: 2021-01-29 | Discharge: 2021-01-29 | Disposition: A | Discharge status: Home / Self Care | Payer: 59 | Source: Ambulatory Visit | Attending: Surgery | Admitting: Surgery

## 2021-01-29 ENCOUNTER — Other Ambulatory Visit: Payer: Self-pay

## 2021-01-29 DIAGNOSIS — Z1231 Encounter for screening mammogram for malignant neoplasm of breast: Secondary | ICD-10-CM

## 2021-02-06 ENCOUNTER — Other Ambulatory Visit: Payer: Self-pay

## 2021-02-06 ENCOUNTER — Ambulatory Visit (AMBULATORY_SURGERY_CENTER): Payer: 59

## 2021-02-06 VITALS — Ht 65.0 in | Wt 170.0 lb

## 2021-02-06 DIAGNOSIS — Z1211 Encounter for screening for malignant neoplasm of colon: Secondary | ICD-10-CM

## 2021-02-06 MED ORDER — PEG 3350-KCL-NA BICARB-NACL 420 G PO SOLR: 4000.0000 mL | Freq: Once | ORAL | 0 refills | Status: AC

## 2021-02-06 NOTE — Progress Notes (Signed)
Pre visit completed via phone call; Patient verified name, DOB, and address; No egg or soy allergy known to patient  No issues known to pt with past sedation with any surgeries or procedures Patient denies ever being told they had issues or difficulty with intubation  No FH of Malignant Hyperthermia Pt is not on diet pills Pt is not on home 02  Pt is not on blood thinners  Pt reports issues with constipation - patient reports she is taking stool softener and drinking warm apple juice, has increased her fluid intake, takes MOM, advised to increase fruits/veggies and increase activity; No A fib or A flutter Pt is fully vaccinated for Covid x 2; NO PA's for preps discussed with pt in [facility] today  Discussed with pt there will be an out-of-pocket cost for prep and that varies from $0 to 70 +  dollars - pt verbalized understanding  Due to the COVID-19 pandemic we are asking patients to follow certain guidelines in PV and the [place]   Pt aware of COVID protocols and [facility] guidelines

## 2021-02-20 ENCOUNTER — Ambulatory Visit (AMBULATORY_SURGERY_CENTER): Payer: 59 | Admitting: Gastroenterology

## 2021-02-20 ENCOUNTER — Encounter: Payer: Self-pay | Admitting: Gastroenterology

## 2021-02-20 VITALS — BP 128/77 | HR 68 | Temp 97.1°F | Resp 16 | Ht 65.0 in | Wt 170.0 lb

## 2021-02-20 DIAGNOSIS — Z1211 Encounter for screening for malignant neoplasm of colon: Secondary | ICD-10-CM | POA: Diagnosis not present

## 2021-02-20 DIAGNOSIS — D123 Benign neoplasm of transverse colon: Secondary | ICD-10-CM | POA: Diagnosis not present

## 2021-02-20 MED ORDER — SODIUM CHLORIDE 0.9 % IV SOLN
500.0000 mL | Freq: Once | INTRAVENOUS | Status: DC
Start: 2021-02-20 — End: 2021-02-20

## 2021-02-20 NOTE — Progress Notes (Signed)
Report to PACU, RN, vss, BBS= Clear.  

## 2021-02-20 NOTE — Op Note (Signed)
[facility] Patient Name: Jennifer Haas Procedure Date: 02/20/2021 9:37 AM MRN: [id number] Endoscopist: Ladene Artist , MD Age: 45 Referring MD:  Date of Birth: 08-Aug-1975 Gender: Female Account #: [id number] Procedure:                Colonoscopy Indications:              Screening for colorectal malignant neoplasm Medicines:                Monitored Anesthesia Care Procedure:                Pre-Anesthesia Assessment:                           - Prior to the procedure, a History and Physical                            was performed, and patient medications and                            allergies were reviewed. The patient's tolerance of                            previous anesthesia was also reviewed. The risks                            and benefits of the procedure and the sedation                            options and risks were discussed with the patient.                            All questions were answered, and informed consent                            was obtained. Prior Anticoagulants: The patient has                            taken no previous anticoagulant or antiplatelet                            agents. ASA Grade Assessment: III - A patient with                            severe systemic disease. After reviewing the risks                            and benefits, the patient was deemed in                            satisfactory condition to undergo the procedure.                           After obtaining informed consent, the colonoscope  was passed under direct vision. Throughout the                            procedure, the patient's blood pressure, pulse, and                            oxygen saturations were monitored continuously. The                            CF [id number] #[id number] was introduced through the anus                            and advanced to the the cecum, identified by                            appendiceal  orifice and ileocecal valve. The                            ileocecal valve, appendiceal orifice, and rectum                            were photographed. The quality of the bowel                            preparation was good. The colonoscopy was performed                            without difficulty. The patient tolerated the                            procedure well. Scope In: 9:49:17 AM Scope Out: 10:03:52 AM Scope Withdrawal Time: 0 hours 12 minutes 26 seconds  Total Procedure Duration: 0 hours 14 minutes 35 seconds  Findings:                 The perianal and digital rectal examinations were                            normal.                           A 6 mm polyp was found in the transverse colon. The                            polyp was sessile. The polyp was removed with a                            cold snare. Resection and retrieval were complete.                           A few small-mouthed diverticula were found in the                            sigmoid colon.  Internal hemorrhoids were found during                            retroflexion. The hemorrhoids were small and Grade                            I (internal hemorrhoids that do not prolapse).                           The exam was otherwise without abnormality on                            direct and retroflexion views. Complications:            No immediate complications. Estimated blood loss:                            None. Estimated Blood Loss:     Estimated blood loss: none. Impression:               - One 6 mm polyp in the transverse colon, removed                            with a cold snare. Resected and retrieved.                           - Mild diverticulosis in the sigmoid colon.                           - Internal hemorrhoids.                           - The examination was otherwise normal on direct                            and retroflexion views. Recommendation:            - Repeat colonoscopy after studies are complete for                            surveillance based on pathology results.                           - Patient has a contact number available for                            emergencies. The signs and symptoms of potential                            delayed complications were discussed with the                            patient. Return to normal activities tomorrow.                            Written discharge instructions were provided to the  patient.                           - High fiber diet.                           - Continue present medications.                           - Await pathology results. Ladene Artist, MD 02/20/2021 10:06:17 AM This report has been signed electronically.

## 2021-02-20 NOTE — Progress Notes (Signed)
Pt's states no medical or surgical changes since previsit or office visit. 

## 2021-02-20 NOTE — Patient Instructions (Signed)
Please read handouts provided. Continue present medications. Await pathology results. High Fiber Diet.   YOU HAD AN ENDOSCOPIC PROCEDURE TODAY AT [facility]:   Refer to the procedure report that was given to you for any specific questions about what was found during the examination.  If the procedure report does not answer your questions, please call your gastroenterologist to clarify.  If you requested that your care partner not be given the details of your procedure findings, then the procedure report has been included in a sealed envelope for you to review at your convenience later.  YOU SHOULD EXPECT: Some feelings of bloating in the abdomen. Passage of more gas than usual.  Walking can help get rid of the air that was put into your GI tract during the procedure and reduce the bloating. If you had a lower endoscopy (such as a colonoscopy or flexible sigmoidoscopy) you may notice spotting of blood in your stool or on the toilet paper. If you underwent a bowel prep for your procedure, you may not have a normal bowel movement for a few days.  Please Note:  You might notice some irritation and congestion in your nose or some drainage.  This is from the oxygen used during your procedure.  There is no need for concern and it should clear up in a day or so.  SYMPTOMS TO REPORT IMMEDIATELY:  Following lower endoscopy (colonoscopy or flexible sigmoidoscopy):  Excessive amounts of blood in the stool  Significant tenderness or worsening of abdominal pains  Swelling of the abdomen that is new, acute  Fever of 100F or higher   For urgent or emergent issues, a gastroenterologist can be reached at any hour by calling [phone]. Do not use MyChart messaging for urgent concerns.    DIET:  We do recommend a small meal at first, but then you may proceed to your regular diet.  Drink plenty of fluids but you should avoid alcoholic beverages for 24 hours.  ACTIVITY:  You should plan  to take it easy for the rest of today and you should NOT DRIVE or use heavy machinery until tomorrow (because of the sedation medicines used during the test).    FOLLOW UP: Our staff will call the number listed on your records 48-72 hours following your procedure to check on you and address any questions or concerns that you may have regarding the information given to you following your procedure. If we do not reach you, we will leave a message.  We will attempt to reach you two times.  During this call, we will ask if you have developed any symptoms of COVID 19. If you develop any symptoms (ie: fever, flu-like symptoms, shortness of breath, cough etc.) before then, please call [phone].  If you test positive for Covid 19 in the 2 weeks post procedure, please call and report this information to [place].    If any biopsies were taken you will be contacted by phone or by letter within the next 1-3 weeks.  Please call us at [phone] if you have not heard about the biopsies in 3 weeks.    SIGNATURES/CONFIDENTIALITY: You and/or your care partner have signed paperwork which will be entered into your electronic medical record.  These signatures attest to the fact that that the information above on your After Visit Summary has been reviewed and is understood.  Full responsibility of the confidentiality of this discharge information lies with you and/or your care-partner.

## 2021-02-20 NOTE — Progress Notes (Signed)
History & Physical  Primary Care Physician:  [facility] Primary Gastroenterologist: Lucio Edward, MD  CHIEF COMPLAINT:  CRC screening  HPI: Jennifer Haas is a 45 y.o. female average risk for colorectal cancer presenting for screening colonoscopy.    Past Medical History:  Diagnosis Date   Heart murmur    Hypertension    on meds   PVC's (premature ventricular contractions)    Suicide attempt ([place])    06/2018    Past Surgical History:  Procedure Laterality Date   ABDOMINAL CERCLAGE  05/29/2011   Procedure: CERCLAGE ABDOMINAL;  Surgeon: Darlyn Chamber, MD;  Location: [facility];  Service: Gynecology;  Laterality: N/A;  need to use mersilene band   ABDOMINAL CERCLAGE  10/20/2011   Procedure: CERCLAGE ABDOMINAL;  Surgeon: Cyril Mourning, MD;  Location: [facility];  Service: Gynecology;  Laterality: N/A;  Removal of abdominal cerclage   BREAST EXCISIONAL BIOPSY  2020   BREAST LUMPECTOMY WITH RADIOACTIVE SEED LOCALIZATION Left 11/04/2018   Procedure: LEFT BREAST LUMPECTOMY WITH RADIOACTIVE SEED GUIDED LOCALIZATION;  Surgeon: Coralie Keens, MD;  Location: [facility];  Service: General;  Laterality: Left;   CERVICAL CERCLAGE     CESAREAN SECTION  2013   DILATION AND CURETTAGE OF UTERUS  1997   LEEP     TUBAL LIGATION  2013   WISDOM TOOTH EXTRACTION  2006    Prior to Admission medications   Medication Sig Start Date End Date Taking? Authorizing Provider  ergocalciferol (VITAMIN D2) 1.25 MG (50000 UT) capsule Take 50,000 Units by mouth once a week.   Yes Joline Salt, RN  lisinopril (PRINIVIL,ZESTRIL) 10 MG tablet Take 1 tablet (10 mg total) by mouth daily. (Resume as instructed by your outpatient provider): For hypertension 06/17/18  Yes Nwoko, Herbert Pun I, NP  cyclobenzaprine (FLEXERIL) 10 MG tablet Take 5-10 mg by mouth 3 (three) times daily as needed. Patient not taking: Reported on 02/06/2021 10/09/20   [provider]  ibuprofen (ADVIL) 800 MG tablet Take 800 mg by mouth every 8 (eight) hours as needed. 01/12/21   [provider]  meloxicam (MOBIC) 15 MG tablet Take 15 mg by mouth daily. Patient not taking: Reported on 02/06/2021 12/01/20   [provider]    Current Outpatient Medications  Medication Sig Dispense Refill   ergocalciferol (VITAMIN D2) 1.25 MG (50000 UT) capsule Take 50,000 Units by mouth once a week.     lisinopril (PRINIVIL,ZESTRIL) 10 MG tablet Take 1 tablet (10 mg total) by mouth daily. (Resume as instructed by your outpatient provider): For hypertension     cyclobenzaprine (FLEXERIL) 10 MG tablet Take 5-10 mg by mouth 3 (three) times daily as needed. (Patient not taking: Reported on 02/06/2021)     ibuprofen (ADVIL) 800 MG tablet Take 800 mg by mouth every 8 (eight) hours as needed.     meloxicam (MOBIC) 15 MG tablet Take 15 mg by mouth daily. (Patient not taking: Reported on 02/06/2021)     Current Facility-Administered Medications  Medication Dose Route Frequency Provider Last Rate Last Admin   0.9 %  sodium chloride infusion  500 mL Intravenous Once Ladene Artist, MD        Allergies as of 02/20/2021   (No Known Allergies)    Family History  Problem Relation Age of Onset   Diabetes Mother    Diabetes Father    Heart disease Father    Hypertension Father    Breast  cancer Neg Hx    Colon polyps Neg Hx    Colon cancer Neg Hx    Esophageal cancer Neg Hx    Stomach cancer Neg Hx    Rectal cancer Neg Hx     Social History   Socioeconomic History   Marital status: Legally Separated    Spouse name: Not on file   Number of children: 4   Years of education: Not on file   Highest education level: Associate degree: academic program  Occupational History   Not on file  Tobacco Use   Smoking status: Never   Smokeless tobacco: Never  Vaping Use   Vaping Use: Never used  Substance and Sexual Activity   Alcohol use: Yes    Comment: 1 drink per month    Drug use: No   Sexual activity: Yes    Birth control/protection: Surgical  Other Topics Concern   Not on file  Social History Narrative   Not on file   Social Determinants of Health   Financial Resource Strain: Not on file  Food Insecurity: Not on file  Transportation Needs: Not on file  Physical Activity: Not on file  Stress: Not on file  Social Connections: Not on file  Intimate Partner Violence: Not on file    Review of Systems:  All systems reviewed an negative except where noted in HPI.  Gen: Denies any fever, chills, sweats, anorexia, fatigue, weakness, malaise, weight loss, and sleep disorder CV: Denies chest pain, angina, palpitations, syncope, orthopnea, PND, peripheral edema, and claudication. Resp: Denies dyspnea at rest, dyspnea with exercise, cough, sputum, wheezing, coughing up blood, and pleurisy. GI: Denies vomiting blood, jaundice, and fecal incontinence.   Denies dysphagia or odynophagia. GU : Denies urinary burning, blood in urine, urinary frequency, urinary hesitancy, nocturnal urination, and urinary incontinence. MS: Denies joint pain, limitation of movement, and swelling, stiffness, low back pain, extremity pain. Denies muscle weakness, cramps, atrophy.  Derm: Denies rash, itching, dry skin, hives, moles, warts, or unhealing ulcers.  Psych: Denies depression, anxiety, memory loss, suicidal ideation, hallucinations, paranoia, and confusion. Heme: Denies bruising, bleeding, and enlarged lymph nodes. Neuro:  Denies any headaches, dizziness, paresthesias. Endo:  Denies any problems with DM, thyroid, adrenal function.   Physical Exam: General:  Alert, well-developed, in NAD Head:  Normocephalic and atraumatic. Eyes:  Sclera clear, no icterus.   Conjunctiva pink. Ears:  Normal auditory acuity. Mouth:  No deformity or lesions.  Neck:  Supple; no masses . Lungs:  Clear throughout to auscultation.   No wheezes, crackles, or rhonchi. No acute distress. Heart:   Regular rate and rhythm; no murmurs. Abdomen:  Soft, nondistended, nontender. No masses, hepatomegaly. No obvious masses.  Normal bowel .    Rectal:  Deferred   Msk:  Symmetrical without gross deformities.. Pulses:  Normal pulses noted. Extremities:  Without edema. Neurologic:  Alert and  oriented x4;  grossly normal neurologically. Skin:  Intact without significant lesions or rashes. Cervical Nodes:  No significant cervical adenopathy. Psych:  Alert and cooperative. Normal mood and affect.   Impression / Plan:   CRC screening, average risk, for colonoscopy.   This patient is appropriate for endoscopic procedures in the ambulatory setting.    Pricilla Riffle. Fuller Plan  02/20/2021, 9:46 AM See Shea Evans, [facility], to contact our on call provider

## 2021-02-22 ENCOUNTER — Telehealth: Payer: Self-pay

## 2021-02-22 NOTE — Telephone Encounter (Signed)
  Follow up Call-  Call back number 02/20/2021  Post procedure Call Back phone  # [phone]  Permission to leave phone message Yes  Some recent data might be hidden     Patient questions:  Do you have a fever, pain , or abdominal swelling? No. Pain Score  0 *  Have you tolerated food without any problems? Yes.    Have you been able to return to your normal activities? Yes.    Do you have any questions about your discharge instructions: Diet   No. Medications  No. Follow up visit  No.  Do you have questions or concerns about your Care? No.  Actions: * If pain score is 4 or above: No action needed, pain <4.  Have you developed a fever since your procedure? no  2.   Have you had an respiratory symptoms (SOB or cough) since your procedure? no  3.   Have you tested positive for COVID 19 since your procedure no  4.   Have you had any family members/close contacts diagnosed with the COVID 19 since your procedure?  no   If yes to any of these questions please route to Joylene John, RN and Joella Prince, RN

## 2021-03-08 ENCOUNTER — Encounter: Payer: Self-pay | Admitting: Gastroenterology

## 2021-04-10 ENCOUNTER — Other Ambulatory Visit: Payer: Self-pay

## 2021-04-10 ENCOUNTER — Emergency Department: Payer: 59

## 2021-04-10 ENCOUNTER — Emergency Department
Admission: EM | Admit: 2021-04-10 | Discharge: 2021-04-10 | Disposition: A | Discharge status: Home / Self Care | Payer: 59 | Attending: Emergency Medicine | Admitting: Emergency Medicine

## 2021-04-10 DIAGNOSIS — Z79899 Other long term (current) drug therapy: Secondary | ICD-10-CM | POA: Diagnosis not present

## 2021-04-10 DIAGNOSIS — R202 Paresthesia of skin: Secondary | ICD-10-CM | POA: Diagnosis not present

## 2021-04-10 DIAGNOSIS — G589 Mononeuropathy, unspecified: Secondary | ICD-10-CM | POA: Diagnosis not present

## 2021-04-10 DIAGNOSIS — I10 Essential (primary) hypertension: Secondary | ICD-10-CM | POA: Insufficient documentation

## 2021-04-10 DIAGNOSIS — G588 Other specified mononeuropathies: Secondary | ICD-10-CM

## 2021-04-10 MED ORDER — SODIUM CHLORIDE 0.9% FLUSH
3.0000 mL | Freq: Once | INTRAVENOUS | Status: AC
Start: 2021-04-10 — End: 2021-04-10
  Administered 2021-04-10: 3 mL via INTRAVENOUS

## 2021-04-10 NOTE — Discharge Instructions (Signed)
Your numbness and tingling is either due to entrapment of your median nerve also known as carpal tunnel syndrome versus a nerve problem in your neck.  Please try the wrist splint to see if this helps with your symptoms.  If your symptoms are not improving, please follow-up with your primary care provider.

## 2021-04-10 NOTE — ED Triage Notes (Signed)
Pt come with c/o left arm pain and numbness and tingling. Pt states it radiates up her arm. Pt denies any CP. Pt states this has been going on for few days.

## 2021-04-10 NOTE — ED Provider Notes (Signed)
[facility]  ____________________________________________   Event Date/Time   First MD Initiated Contact with Patient 04/10/21 [phone]     (approximate)  I have reviewed the triage vital signs and the nursing notes.   HISTORY  Chief Complaint Numbness    HPI Jennifer Haas is a 45 y.o. female with pmh HTN, depression who presents with arm numbness and tingling in her left hand.  Symptoms started several days ago.  She endorses pain in the left hand that radiates up to the wrist and lower arm.  Denies weakness.  Also has tingling and decreased sensation throughout the hand.  She does occasionally have some discomfort in the left side of her neck worse with head movement.  Denies any injury.  She denies visual change headache dizziness or weakness in any other part of her body.  Did feel like her foot fell asleep over the weekend but this was short-lived.  This numbness and tingling sensation is sometimes constant but then will improve and come back in a shooting sensation.  She denies any chest pain or dyspnea.  Has never had this before.         Past Medical History:  Diagnosis Date   Heart murmur    Hypertension    on meds   PVC's (premature ventricular contractions)    Suicide attempt ([place])    06/2018    Patient Active Problem List   Diagnosis Date Noted   MDD (major depressive disorder), severe ([place]) 06/16/2018   Suicide attempt ([place])    Atrial septal aneurysm 09/19/2013   Premature atrial contractions 09/19/2013   HTN (hypertension) 09/19/2013   Abdominal pain, LLQ 05/18/2011    Past Surgical History:  Procedure Laterality Date   ABDOMINAL CERCLAGE  05/29/2011   Procedure: CERCLAGE ABDOMINAL;  Surgeon: Darlyn Chamber, MD;  Location: [facility];  Service: Gynecology;  Laterality: N/A;  need to use mersilene band   ABDOMINAL CERCLAGE  10/20/2011   Procedure: CERCLAGE ABDOMINAL;  Surgeon: Cyril Mourning, MD;  Location: [facility];  Service:  Gynecology;  Laterality: N/A;  Removal of abdominal cerclage   BREAST EXCISIONAL BIOPSY  2020   BREAST LUMPECTOMY WITH RADIOACTIVE SEED LOCALIZATION Left 11/04/2018   Procedure: LEFT BREAST LUMPECTOMY WITH RADIOACTIVE SEED GUIDED LOCALIZATION;  Surgeon: Coralie Keens, MD;  Location: [facility];  Service: General;  Laterality: Left;   CERVICAL CERCLAGE     CESAREAN SECTION  2013   DILATION AND CURETTAGE OF UTERUS  1997   LEEP     TUBAL LIGATION  2013   WISDOM TOOTH EXTRACTION  2006    Prior to Admission medications   Medication Sig Start Date End Date Taking? Authorizing Provider  cyclobenzaprine (FLEXERIL) 10 MG tablet Take 5-10 mg by mouth 3 (three) times daily as needed. Patient not taking: Reported on 02/06/2021 10/09/20   [provider]  ergocalciferol (VITAMIN D2) 1.25 MG (50000 UT) capsule Take 50,000 Units by mouth once a week.    Joline Salt, RN  ibuprofen (ADVIL) 800 MG tablet Take 800 mg by mouth every 8 (eight) hours as needed. 01/12/21   [provider]  lisinopril (PRINIVIL,ZESTRIL) 10 MG tablet Take 1 tablet (10 mg total) by mouth daily. (Resume as instructed by your outpatient provider): For hypertension 06/17/18   Lindell Spar I, NP  meloxicam (MOBIC) 15 MG tablet Take 15 mg by mouth daily. Patient not taking: Reported on 02/06/2021 12/01/20   [provider]  Allergies Patient has no known allergies.  Family History  Problem Relation Age of Onset   Diabetes Mother    Diabetes Father    Heart disease Father    Hypertension Father    Breast cancer Neg Hx    Colon polyps Neg Hx    Colon cancer Neg Hx    Esophageal cancer Neg Hx    Stomach cancer Neg Hx    Rectal cancer Neg Hx     Social History Social History   Tobacco Use   Smoking status: Never   Smokeless tobacco: Never  Vaping Use   Vaping Use: Never used  Substance Use Topics   Alcohol use: Yes    Comment: 1 drink per month   Drug use: No    Review  of Systems   Review of Systems  Respiratory:  Negative for shortness of breath.   Cardiovascular:  Negative for chest pain.  Neurological:  Positive for numbness. Negative for weakness.  All other systems reviewed and are negative.  Physical Exam Updated Vital Signs BP 124/88 (BP Location: Left Arm)   Pulse 71   Temp 98.5 F (36.9 C) (Oral)   Resp 17   LMP 03/26/2021 (Exact Date)   SpO2 98%   Physical Exam Vitals and nursing note reviewed.  Constitutional:      General: She is not in acute distress.    Appearance: Normal appearance.  HENT:     Head: Normocephalic and atraumatic.  Eyes:     General: No scleral icterus.    Conjunctiva/sclera: Conjunctivae normal.  Pulmonary:     Effort: Pulmonary effort is normal. No respiratory distress.     Breath sounds: No stridor.  Musculoskeletal:        General: No deformity or signs of injury.     Cervical back: Normal range of motion.  Skin:    General: Skin is dry.     Coloration: Skin is not jaundiced or pale.  Neurological:     General: No focal deficit present.     Mental Status: She is alert and oriented to person, place, and time. Mental status is at baseline.     Comments: Subjective decrease sensation over the first second and third digits, normal sensation in the fourth and fifth digits 5 out of 5 strength with thumbs up, okay sign and finger abduction 5-5 strength with elbow flexion, extension, grip, wrist extension  Aox3, nml speech  PERRL, EOMI, face symmetric, nml tongue movement  5/5 strength in the BL upper and lower extremities    Psychiatric:        Mood and Affect: Mood normal.        Behavior: Behavior normal.     LABS (all labs ordered are listed, but only abnormal results are displayed)  Labs Reviewed - No data to display  ____________________________________________  EKG  NSR, nml axis, nml intervals, no acute ischemic changes  ____________________________________________  RADIOLOGY I,  Madelin Headings, personally viewed and evaluated these images (plain radiographs) as part of my medical decision making, as well as reviewing the written report by the radiologist.  ED MD interpretation:  I reviewed the CT scan of the brain which does not show any acute intracranial process      ____________________________________________   PROCEDURES  Procedure(s) performed (including Critical Care):  Procedures   ____________________________________________   INITIAL IMPRESSION / ASSESSMENT AND PLAN / ED COURSE     The patient is a 45 year old female presents with several days of  paresthesias and pain in her left hand/lower arm.  She denies associated weakness and symptoms are really confined to the left arm.  She has no other neurologic symptoms including no visual change no weakness and no other paresthesias or weakness in the upper part of her body.  On exam her sensory abnormalities are most prominent in the first second and third digits which is consistent with a median nerve distribution.  With the associated pain, carpal tunnel certainly possible.  Although patient also has some neck discomfort and with flexion of the neck does endorse that shooting sensation down to her hand so cervical radiculopathy also possible.  I have very low suspicion for central process.  A CT head was ordered from triage and this is normal.  Presentation is consistent with peripheral neuropathy either of the median nerve or more proximally cervical radiculopathy.  We discussed wearing a wrist splint for possible median nerve pathology.  She will follow-up with her primary care provider if symptoms continue.  T      ____________________________________________   FINAL CLINICAL IMPRESSION(S) / ED DIAGNOSES  Final diagnoses:  Other mononeuropathy     ED Discharge Orders          Ordered    Wrist splint        04/10/21 0758             Note:  This document was prepared using Dragon  voice recognition software and may include unintentional dictation errors.    Rada Hay, MD 04/10/21 [phone]

## 2021-06-05 DIAGNOSIS — G459 Transient cerebral ischemic attack, unspecified: Secondary | ICD-10-CM

## 2021-06-05 HISTORY — DX: Transient cerebral ischemic attack, unspecified: G45.9

## 2021-06-11 DIAGNOSIS — R5383 Other fatigue: Secondary | ICD-10-CM | POA: Diagnosis not present

## 2021-06-11 DIAGNOSIS — R6882 Decreased libido: Secondary | ICD-10-CM | POA: Diagnosis not present

## 2021-06-11 DIAGNOSIS — F419 Anxiety disorder, unspecified: Secondary | ICD-10-CM | POA: Diagnosis not present

## 2021-06-11 DIAGNOSIS — M549 Dorsalgia, unspecified: Secondary | ICD-10-CM | POA: Diagnosis not present

## 2021-06-18 DIAGNOSIS — R208 Other disturbances of skin sensation: Secondary | ICD-10-CM | POA: Diagnosis not present

## 2021-06-18 DIAGNOSIS — E7849 Other hyperlipidemia: Secondary | ICD-10-CM | POA: Diagnosis not present

## 2021-06-18 DIAGNOSIS — I6521 Occlusion and stenosis of right carotid artery: Secondary | ICD-10-CM | POA: Diagnosis not present

## 2021-06-18 DIAGNOSIS — Z833 Family history of diabetes mellitus: Secondary | ICD-10-CM | POA: Diagnosis not present

## 2021-06-18 DIAGNOSIS — R29818 Other symptoms and signs involving the nervous system: Secondary | ICD-10-CM | POA: Diagnosis not present

## 2021-06-18 DIAGNOSIS — R2689 Other abnormalities of gait and mobility: Secondary | ICD-10-CM | POA: Diagnosis not present

## 2021-06-18 DIAGNOSIS — R5383 Other fatigue: Secondary | ICD-10-CM | POA: Diagnosis not present

## 2021-06-18 DIAGNOSIS — R531 Weakness: Secondary | ICD-10-CM | POA: Diagnosis not present

## 2021-06-18 DIAGNOSIS — M50323 Other cervical disc degeneration at C6-C7 level: Secondary | ICD-10-CM | POA: Diagnosis not present

## 2021-06-18 DIAGNOSIS — Z79899 Other long term (current) drug therapy: Secondary | ICD-10-CM | POA: Diagnosis not present

## 2021-06-18 DIAGNOSIS — Z9181 History of falling: Secondary | ICD-10-CM | POA: Diagnosis not present

## 2021-06-18 DIAGNOSIS — G459 Transient cerebral ischemic attack, unspecified: Secondary | ICD-10-CM | POA: Diagnosis not present

## 2021-06-18 DIAGNOSIS — M50322 Other cervical disc degeneration at C5-C6 level: Secondary | ICD-10-CM | POA: Diagnosis not present

## 2021-06-18 DIAGNOSIS — M50921 Unspecified cervical disc disorder at C4-C5 level: Secondary | ICD-10-CM | POA: Diagnosis not present

## 2021-06-18 DIAGNOSIS — G379 Demyelinating disease of central nervous system, unspecified: Secondary | ICD-10-CM | POA: Diagnosis not present

## 2021-06-18 DIAGNOSIS — R2681 Unsteadiness on feet: Secondary | ICD-10-CM | POA: Diagnosis not present

## 2021-06-18 DIAGNOSIS — R202 Paresthesia of skin: Secondary | ICD-10-CM | POA: Diagnosis not present

## 2021-06-18 DIAGNOSIS — R519 Headache, unspecified: Secondary | ICD-10-CM | POA: Diagnosis not present

## 2021-06-18 DIAGNOSIS — I10 Essential (primary) hypertension: Secondary | ICD-10-CM | POA: Diagnosis not present

## 2021-06-18 DIAGNOSIS — Z8249 Family history of ischemic heart disease and other diseases of the circulatory system: Secondary | ICD-10-CM | POA: Diagnosis not present

## 2021-06-19 DIAGNOSIS — G459 Transient cerebral ischemic attack, unspecified: Secondary | ICD-10-CM | POA: Diagnosis not present

## 2021-06-19 DIAGNOSIS — I071 Rheumatic tricuspid insufficiency: Secondary | ICD-10-CM | POA: Diagnosis not present

## 2021-06-26 DIAGNOSIS — I10 Essential (primary) hypertension: Secondary | ICD-10-CM | POA: Diagnosis not present

## 2021-06-26 DIAGNOSIS — R7303 Prediabetes: Secondary | ICD-10-CM | POA: Diagnosis not present

## 2021-06-26 DIAGNOSIS — E7849 Other hyperlipidemia: Secondary | ICD-10-CM | POA: Diagnosis not present

## 2021-06-26 DIAGNOSIS — G459 Transient cerebral ischemic attack, unspecified: Secondary | ICD-10-CM | POA: Diagnosis not present

## 2021-07-11 DIAGNOSIS — I10 Essential (primary) hypertension: Secondary | ICD-10-CM | POA: Diagnosis not present

## 2021-07-11 DIAGNOSIS — R7303 Prediabetes: Secondary | ICD-10-CM | POA: Diagnosis not present

## 2021-07-11 DIAGNOSIS — E7849 Other hyperlipidemia: Secondary | ICD-10-CM | POA: Diagnosis not present

## 2021-07-11 DIAGNOSIS — F322 Major depressive disorder, single episode, severe without psychotic features: Secondary | ICD-10-CM | POA: Diagnosis not present

## 2021-07-11 DIAGNOSIS — Z0000 Encounter for general adult medical examination without abnormal findings: Secondary | ICD-10-CM | POA: Diagnosis not present

## 2021-08-02 DIAGNOSIS — J3089 Other allergic rhinitis: Secondary | ICD-10-CM | POA: Diagnosis not present

## 2022-01-09 ENCOUNTER — Other Ambulatory Visit: Payer: Self-pay | Admitting: Surgery

## 2022-01-09 DIAGNOSIS — Z1231 Encounter for screening mammogram for malignant neoplasm of breast: Secondary | ICD-10-CM

## 2022-01-10 ENCOUNTER — Ambulatory Visit: Payer: BC Managed Care – PPO | Admitting: Family

## 2022-01-24 ENCOUNTER — Ambulatory Visit: Payer: BC Managed Care – PPO | Admitting: Family

## 2022-01-29 DIAGNOSIS — Z1151 Encounter for screening for human papillomavirus (HPV): Secondary | ICD-10-CM | POA: Diagnosis not present

## 2022-01-29 DIAGNOSIS — Z6830 Body mass index (BMI) 30.0-30.9, adult: Secondary | ICD-10-CM | POA: Diagnosis not present

## 2022-01-29 DIAGNOSIS — Z01419 Encounter for gynecological examination (general) (routine) without abnormal findings: Secondary | ICD-10-CM | POA: Diagnosis not present

## 2022-01-29 DIAGNOSIS — Z124 Encounter for screening for malignant neoplasm of cervix: Secondary | ICD-10-CM | POA: Diagnosis not present

## 2022-01-30 ENCOUNTER — Ambulatory Visit
Admission: RE | Admit: 2022-01-30 | Discharge: 2022-01-30 | Disposition: A | Discharge status: Home / Self Care | Payer: BC Managed Care – PPO | Source: Ambulatory Visit | Attending: Surgery | Admitting: Surgery

## 2022-01-30 DIAGNOSIS — Z1231 Encounter for screening mammogram for malignant neoplasm of breast: Secondary | ICD-10-CM | POA: Diagnosis not present

## 2022-01-31 ENCOUNTER — Other Ambulatory Visit: Payer: Self-pay | Admitting: Surgery

## 2022-01-31 DIAGNOSIS — R928 Other abnormal and inconclusive findings on diagnostic imaging of breast: Secondary | ICD-10-CM

## 2022-02-06 ENCOUNTER — Other Ambulatory Visit: Payer: Self-pay | Admitting: Surgery

## 2022-02-06 ENCOUNTER — Ambulatory Visit
Admission: RE | Admit: 2022-02-06 | Discharge: 2022-02-06 | Disposition: A | Discharge status: Home / Self Care | Payer: BC Managed Care – PPO | Source: Ambulatory Visit | Attending: Surgery | Admitting: Surgery

## 2022-02-06 DIAGNOSIS — R928 Other abnormal and inconclusive findings on diagnostic imaging of breast: Secondary | ICD-10-CM

## 2022-02-18 ENCOUNTER — Other Ambulatory Visit: Payer: Self-pay | Admitting: Surgery

## 2022-02-18 DIAGNOSIS — R928 Other abnormal and inconclusive findings on diagnostic imaging of breast: Secondary | ICD-10-CM

## 2022-02-21 ENCOUNTER — Ambulatory Visit
Admission: RE | Admit: 2022-02-21 | Discharge: 2022-02-21 | Disposition: A | Discharge status: Home / Self Care | Payer: BC Managed Care – PPO | Source: Ambulatory Visit | Attending: Surgery | Admitting: Surgery

## 2022-02-21 DIAGNOSIS — R928 Other abnormal and inconclusive findings on diagnostic imaging of breast: Secondary | ICD-10-CM

## 2022-02-21 DIAGNOSIS — D242 Benign neoplasm of left breast: Secondary | ICD-10-CM | POA: Diagnosis not present

## 2022-02-21 DIAGNOSIS — R921 Mammographic calcification found on diagnostic imaging of breast: Secondary | ICD-10-CM | POA: Diagnosis not present

## 2022-02-21 DIAGNOSIS — N6022 Fibroadenosis of left breast: Secondary | ICD-10-CM | POA: Diagnosis not present

## 2022-03-12 DIAGNOSIS — D649 Anemia, unspecified: Secondary | ICD-10-CM | POA: Diagnosis not present

## 2022-03-17 DIAGNOSIS — H1031 Unspecified acute conjunctivitis, right eye: Secondary | ICD-10-CM | POA: Diagnosis not present

## 2022-04-01 ENCOUNTER — Other Ambulatory Visit: Payer: Self-pay | Admitting: Surgery

## 2022-04-01 DIAGNOSIS — N6092 Unspecified benign mammary dysplasia of left breast: Secondary | ICD-10-CM | POA: Diagnosis not present

## 2022-04-01 DIAGNOSIS — D242 Benign neoplasm of left breast: Secondary | ICD-10-CM | POA: Diagnosis not present

## 2022-04-03 ENCOUNTER — Other Ambulatory Visit: Payer: Self-pay | Admitting: Surgery

## 2022-04-03 DIAGNOSIS — N6092 Unspecified benign mammary dysplasia of left breast: Secondary | ICD-10-CM

## 2022-04-04 ENCOUNTER — Encounter: Payer: Self-pay | Admitting: Family

## 2022-04-04 ENCOUNTER — Other Ambulatory Visit (INDEPENDENT_AMBULATORY_CARE_PROVIDER_SITE_OTHER): Payer: BC Managed Care – PPO

## 2022-04-04 ENCOUNTER — Other Ambulatory Visit: Payer: Self-pay | Admitting: Family

## 2022-04-04 ENCOUNTER — Ambulatory Visit: Payer: BC Managed Care – PPO | Admitting: Family

## 2022-04-04 VITALS — BP 116/70 | HR 71 | Temp 98.2°F | Resp 16 | Ht 65.0 in | Wt 181.5 lb

## 2022-04-04 DIAGNOSIS — R002 Palpitations: Secondary | ICD-10-CM | POA: Insufficient documentation

## 2022-04-04 DIAGNOSIS — Z8673 Personal history of transient ischemic attack (TIA), and cerebral infarction without residual deficits: Secondary | ICD-10-CM | POA: Insufficient documentation

## 2022-04-04 DIAGNOSIS — D259 Leiomyoma of uterus, unspecified: Secondary | ICD-10-CM

## 2022-04-04 DIAGNOSIS — F325 Major depressive disorder, single episode, in full remission: Secondary | ICD-10-CM

## 2022-04-04 DIAGNOSIS — R739 Hyperglycemia, unspecified: Secondary | ICD-10-CM

## 2022-04-04 DIAGNOSIS — I491 Atrial premature depolarization: Secondary | ICD-10-CM

## 2022-04-04 DIAGNOSIS — Z9151 Personal history of suicidal behavior: Secondary | ICD-10-CM

## 2022-04-04 DIAGNOSIS — T1491XA Suicide attempt, initial encounter: Secondary | ICD-10-CM

## 2022-04-04 DIAGNOSIS — I10 Essential (primary) hypertension: Secondary | ICD-10-CM

## 2022-04-04 DIAGNOSIS — I253 Aneurysm of heart: Secondary | ICD-10-CM

## 2022-04-04 DIAGNOSIS — R7303 Prediabetes: Secondary | ICD-10-CM

## 2022-04-04 LAB — CBC
HCT: 39.3 % (ref 36.0–46.0)
Hemoglobin: 13 g/dL (ref 12.0–15.0)
MCHC: 33.1 g/dL (ref 30.0–36.0)
MCV: 86.1 fl (ref 78.0–100.0)
Platelets: 325 10*3/uL (ref 150.0–400.0)
RBC: 4.57 Mil/uL (ref 3.87–5.11)
RDW: 13.7 % (ref 11.5–15.5)
WBC: 4.1 10*3/uL (ref 4.0–10.5)

## 2022-04-04 LAB — BASIC METABOLIC PANEL
BUN: 15 mg/dL (ref 6–23)
CO2: 29 mEq/L (ref 19–32)
Calcium: 9.1 mg/dL (ref 8.4–10.5)
Chloride: 100 mEq/L (ref 96–112)
Creatinine, Ser: 0.56 mg/dL (ref 0.40–1.20)
GFR: 108.86 mL/min (ref >60.00)
Glucose, Bld: 115 mg/dL — ABNORMAL HIGH (ref 70–99)
Potassium: 3.5 mEq/L (ref 3.5–5.1)
Sodium: 138 mEq/L (ref 135–145)

## 2022-04-04 LAB — HEMOGLOBIN A1C: Hgb A1c MFr Bld: 6.2 % (ref 4.6–6.5)

## 2022-04-04 LAB — TSH: TSH: 1.16 u[IU]/mL (ref 0.35–5.50)

## 2022-04-04 NOTE — Assessment & Plan Note (Deleted)
H/o three years ago 2020, buying a house.  Accidentally took too much medication.  Ended up in hospital for this.

## 2022-04-04 NOTE — Patient Instructions (Signed)
Stop by the lab prior to leaving today. I will notify you of your results once received.    Regards,   Eugenia Pancoast FNP-C

## 2022-04-04 NOTE — Progress Notes (Signed)
New Patient Office Visit  Subjective:  Patient ID: Jennifer Haas, female    DOB: 06/13/1975  Age: 46 y.o. MRN: [id number]  CC:  Chief Complaint  Patient presents with   Establish Care    HPI Jennifer Haas is here to establish care as a new patient.  Prior provider was: Dr. Porfirio Oar  Pt is without acute concerns.   chronic concerns:  PACs and hypertension, atrial septal aneurysm: she does note that she can feel an extra beat especially during her menstrual cycle, only occasional coffee intake, not daily. Did see Dr. Elease Hashimoto but last visit appears to be from 2015. Per notes from 2015, atrial septal aneurysm is a benign finding and does not require any additional f/u or eval. Pacs were also reassuring per cardiology note, and stated benign findings.  Atypical lobular hyperplasia: surgery back in 2021. However recent mammogram showed it again, has surgery pending on 04/24/22. The mammogram showed calcifications and a small mass in upper inner quadrant left breast. 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   H/o Tia, earlier this year, on baby asa 81 mg.   Past Medical History:  Diagnosis Date   Heart murmur    Hypertension    on meds   PVC's (premature ventricular contractions)    Suicide attempt (HCC)    06/2018   TIA (transient ischemic attack) 06/2021    Past Surgical History:  Procedure Laterality Date   ABDOMINAL CERCLAGE  05/29/2011   Procedure: CERCLAGE ABDOMINAL;  Surgeon: Juluis Mire, MD;  Location: [facility];  Service: Gynecology;  Laterality: N/A;  need to use mersilene band   ABDOMINAL CERCLAGE  10/20/2011   Procedure: CERCLAGE ABDOMINAL;  Surgeon: Jeani Hawking, MD;  Location: [facility];  Service: Gynecology;  Laterality: N/A;  Removal of abdominal cerclage   BREAST EXCISIONAL BIOPSY  2020   BREAST LUMPECTOMY WITH RADIOACTIVE SEED LOCALIZATION Left 11/04/2018   Procedure: LEFT BREAST LUMPECTOMY WITH  RADIOACTIVE SEED GUIDED LOCALIZATION;  Surgeon: Abigail Miyamoto, MD;  Location: [facility];  Service: General;  Laterality: Left;   CESAREAN SECTION  2013   DILATION AND CURETTAGE OF UTERUS  1997   LEEP     TUBAL LIGATION  2013   WISDOM TOOTH EXTRACTION  2006    Family History  Problem Relation Age of Onset   Diabetes Mother    Diabetes Father    Heart disease Father    Hypertension Father    Alzheimer's disease Maternal Grandmother    Heart attack Maternal Grandfather    Stroke Paternal Grandmother    Breast cancer Neg Hx    Colon polyps Neg Hx    Colon cancer Neg Hx    Esophageal cancer Neg Hx    Stomach cancer Neg Hx    Rectal cancer Neg Hx     Social History   Socioeconomic History   Marital status: Married    Spouse name: Not on file   Number of children: 4   Years of education: Not on file   Highest education level: Associate degree: academic program  Occupational History   Not on file  Tobacco Use   Smoking status: Never   Smokeless tobacco: Never  Vaping Use   Vaping Use: Never used  Substance and Sexual Activity   Alcohol use: Yes    Comment: 1 drink per month   Drug use: No   Sexual activity: Yes    Partners: Male    Birth control/protection:  Surgical  Other Topics Concern   Not on file  Social History Narrative   Not on file   Social Determinants of Health   Financial Resource Strain: Not on file  Food Insecurity: Not on file  Transportation Needs: No Transportation Needs (09/30/2018)   PRAPARE - Administrator, Civil Service (Medical): No    Lack of Transportation (Non-Medical): No  Physical Activity: Not on file  Stress: Not on file  Social Connections: Not on file  Intimate Partner Violence: Not on file    Outpatient Medications Prior to Visit  Medication Sig Dispense Refill   aspirin 81 MG chewable tablet Chew 81 mg by mouth daily.     atorvastatin (LIPITOR) 20 MG tablet Take 1 tablet by mouth daily.      cyclobenzaprine (FLEXERIL) 10 MG tablet Take 5-10 mg by mouth 3 (three) times daily as needed.     ergocalciferol (VITAMIN D2) 1.25 MG (50000 UT) capsule Take 50,000 Units by mouth once a week.     ibuprofen (ADVIL) 800 MG tablet Take 800 mg by mouth every 8 (eight) hours as needed.     lisinopril (PRINIVIL,ZESTRIL) 10 MG tablet Take 1 tablet (10 mg total) by mouth daily. (Resume as instructed by your outpatient provider): For hypertension     oxybutynin (DITROPAN-XL) 5 MG 24 hr tablet Take 5 mg by mouth as needed.     meloxicam (MOBIC) 15 MG tablet Take 15 mg by mouth daily.     No facility-administered medications prior to visit.    No Known Allergies  ROS Review of Systems  Review of Systems  Respiratory:  Negative for shortness of breath.   Cardiovascular:  Negative for chest pain and palpitations.  Gastrointestinal:  Negative for constipation and diarrhea.  Genitourinary:  Negative for dysuria, frequency and urgency.  Musculoskeletal:  Negative for myalgias.  Psychiatric/Behavioral:  Negative for depression and suicidal ideas.   All other systems reviewed and are negative.    Objective:    Physical Exam  Gen: NAD, resting comfortably CV: RRR with no murmurs appreciated Pulm: NWOB, CTAB with no crackles, wheezes, or rhonchi Skin: warm, dry Psych: Normal affect and thought content  BP 116/70   Pulse 71   Temp 98.2 F (36.8 C)   Resp 16   Ht 5\' 5"  (1.651 m)   Wt 181 lb 8 oz (82.3 kg)   LMP 04/03/2022   SpO2 99%   BMI 30.20 kg/m  Wt Readings from Last 3 Encounters:  04/04/22 181 lb 8 oz (82.3 kg)  02/20/21 170 lb (77.1 kg)  02/06/21 170 lb (77.1 kg)     Health Maintenance Due  Topic Date Due   Hepatitis C Screening  Never done   DTaP/Tdap/Td (1 - Tdap) Never done   PAP SMEAR-Modifier  Never done   COVID-19 Vaccine (4 - 2023-24 season) 01/03/2022    There are no preventive care reminders to display for this patient.  Lab Results  Component Value Date    TSH 1.16 04/04/2022   Lab Results  Component Value Date   WBC 4.1 04/04/2022   HGB 13.0 04/04/2022   HCT 39.3 04/04/2022   MCV 86.1 04/04/2022   PLT 325.0 04/04/2022   Lab Results  Component Value Date   NA 138 04/04/2022   K 3.5 04/04/2022   CO2 29 04/04/2022   GLUCOSE 115 (H) 04/04/2022   BUN 15 04/04/2022   CREATININE 0.56 04/04/2022   BILITOT 1.2 06/15/2018   ALKPHOS 61  06/15/2018   AST 33 06/15/2018   ALT 13 06/15/2018   PROT 8.2 (H) 06/15/2018   ALBUMIN 4.3 06/15/2018   CALCIUM 9.1 04/04/2022   ANIONGAP 9 06/15/2018   GFR 108.86 04/04/2022   No results found for: "CHOL" No results found for: "HDL" No results found for: "LDLCALC" No results found for: "TRIG" No results found for: "CHOLHDL" Lab Results  Component Value Date   HGBA1C 6.2 04/04/2022      Assessment & Plan:   Problem List Items Addressed This Visit       Cardiovascular and Mediastinum   Atrial septal aneurysm    Reviewed cardiology notes, discharged from their care only for prn needs.  Benign finding per cardiologist.      Relevant Medications   aspirin 81 MG chewable tablet   atorvastatin (LIPITOR) 20 MG tablet   Premature atrial contractions - Primary    Stable. Advised pt to f/u if increased frequency      Relevant Medications   aspirin 81 MG chewable tablet   atorvastatin (LIPITOR) 20 MG tablet   Other Relevant Orders   EKG 12-Lead (Completed)   HTN (hypertension)    Stable Continue medication as prescribed      Relevant Medications   aspirin 81 MG chewable tablet   atorvastatin (LIPITOR) 20 MG tablet     Genitourinary   Uterine leiomyoma     Other   History of TIA (transient ischemic attack)    Continue 81 mg asa       Major depressive disorder, single episode, in remission (HCC)   History of suicide attempt   Palpitations    Repeat EKG today Nsr which is reassuring  Advised pt to limit caffeine and avoid energy drinks and keep adequately hydrated.   Tsh and  cbc ordered as well as bmp to r/o electrolyte abnormalities, thyroid disease and or anemia      Relevant Orders   EKG 12-Lead (Completed)   TSH (Completed)   CBC (Completed)   Prediabetes   Relevant Orders   Basic metabolic panel (Completed)   RESOLVED: Suicide attempt (HCC)    No orders of the defined types were placed in this encounter.   Follow-up: Return in about 3 months (around 07/04/2022) for f/u hypertension .    Mort Sawyers, FNP

## 2022-04-04 NOTE — Progress Notes (Signed)
Can we add A1c?order added to future

## 2022-04-07 NOTE — Assessment & Plan Note (Addendum)
Repeat EKG today Nsr which is reassuring  Advised pt to limit caffeine and avoid energy drinks and keep adequately hydrated.   Tsh and cbc ordered as well as bmp to r/o electrolyte abnormalities, thyroid disease and or anemia

## 2022-04-07 NOTE — Assessment & Plan Note (Signed)
Stable Continue medication as prescribed

## 2022-04-07 NOTE — Assessment & Plan Note (Signed)
Stable. Advised pt to f/u if increased frequency

## 2022-04-07 NOTE — Assessment & Plan Note (Signed)
Continue 81 mg asa

## 2022-04-07 NOTE — Assessment & Plan Note (Signed)
Reviewed cardiology notes, discharged from their care only for prn needs.  Benign finding per cardiologist.

## 2022-04-18 ENCOUNTER — Encounter (HOSPITAL_BASED_OUTPATIENT_CLINIC_OR_DEPARTMENT_OTHER): Payer: Self-pay | Admitting: Surgery

## 2022-04-23 ENCOUNTER — Ambulatory Visit
Admission: RE | Admit: 2022-04-23 | Discharge: 2022-04-23 | Disposition: A | Discharge status: Home / Self Care | Payer: BC Managed Care – PPO | Source: Ambulatory Visit | Attending: Surgery | Admitting: Surgery

## 2022-04-23 DIAGNOSIS — R928 Other abnormal and inconclusive findings on diagnostic imaging of breast: Secondary | ICD-10-CM | POA: Diagnosis not present

## 2022-04-23 DIAGNOSIS — N6092 Unspecified benign mammary dysplasia of left breast: Secondary | ICD-10-CM

## 2022-04-23 HISTORY — PX: BREAST BIOPSY: SHX20

## 2022-04-23 MED ORDER — ENSURE PRE-SURGERY PO LIQD: 296.0000 mL | Freq: Once | ORAL | Status: DC

## 2022-04-23 NOTE — Progress Notes (Signed)

## 2022-04-23 NOTE — H&P (Signed)
REFERRING PHYSICIAN: Beverlee Nims*  PROVIDER: Beverlee Nims, MD  MRN: [id number] DOB: 1975-06-16 DATE OF ENCOUNTER: 04/01/2022 Subjective  Chief Complaint: New Consultation (Left Breast )   History of Present Illness: Jennifer Haas is a 46 y.o. female who is seen as an office consultation for evaluation of New Consultation (Left Breast ) .  This is a 46 year old female lapsing in the past for intraductal papilloma of the left breast in 2020. On screening mammography recently she was found to have calcifications and a small mass in the upper inner quadrant of the left breast. Her previous area was in the upper outer quadrant of the left breast. She underwent a biopsy showing an intraductal papilloma and atypical lobular hyperplasia. The mass and calcifications measured 1.8 cm in size. She denies nipple discharge. Again, she has no family history of breast cancer. She did have a small TIA earlier this year but is only on aspirin. She has had no cardiopulmonary issues.  Review of Systems: A complete review of systems was obtained from the patient. I have reviewed this information and discussed as appropriate with the patient. See HPI as well for other ROS.  ROS  Medical History: Past Medical History: Diagnosis Date Arthritis Hyperlipidemia Hypertension  Patient Active Problem List Diagnosis Abdominal pain, LLQ Aneurysm of heart Atrial septal aneurysm Back pain Benign essential HTN Cervicalgia DDD (degenerative disc disease), cervical History of colon polyps History of suicide attempt HTN (hypertension) Intraductal papilloma of left breast Irritable bowel syndrome with constipation MDD (major depressive disorder), severe (CMS-HCC) Other hyperlipidemia Overactive bladder Prediabetes Premature atrial contractions Shoulder pain Suicide attempt (CMS-HCC) Supraventricular premature beats TIA (transient ischemic attack) Uterine leiomyoma Vitamin D  deficiency  History reviewed. No pertinent surgical history.  No Known Allergies  No current outpatient medications on file prior to visit.  No current facility-administered medications on file prior to visit.  No family history on file.  Social History  Tobacco Use Smoking Status Never Smokeless Tobacco Never   Social History  Socioeconomic History Marital status: Married Tobacco Use Smoking status: Never Smokeless tobacco: Never Substance and Sexual Activity Alcohol use: Not Currently Drug use: Never  Objective:  Vitals:  BP: 124/82 Pulse: 84 Temp: 36.5 C (97.7 F) SpO2: 99% Weight: 83.7 kg (184 lb 9.6 oz) Height: 165.1 cm ('5\' 5"'$ )  Body mass index is 30.72 kg/m.  Physical Exam  She appears well on exam.  There are no palpable breast masses.  There is no axillary adenopathy on either side  The nipple areolar complexes are normal.  Her old left breast incision in the upper outer quadrant near the axilla is well-healed  Labs, Imaging and Diagnostic Testing: I reviewed her ultrasound and mammograms as well as her pathology results  Assessment and Plan:  Diagnoses and all orders for this visit:  Atypical lobular hyperplasia (ALH) of left breast  Intraductal papilloma of breast, left    I discussed the pathology results with her. Again, there is no evidence of malignancy but complete surgical removal of this area is recommended to rule out an early cancer. I again discussed the procedure with her in detail. This would be with a radioactive seed guided left breast lumpectomy similar to the procedure in 2020. We discussed the risks which includes but is not limited to bleeding, infection, injury to surrounding structures, the need for further surgery if malignancy is found, cardiopulmonary issues, TIA, DVT, etc. He understands and wishes to proceed with surgery which will be scheduled. We will then  send her to an high risk breast clinic  postoperatively.

## 2022-04-23 NOTE — Anesthesia Preprocedure Evaluation (Addendum)
Anesthesia Evaluation  Patient identified by MRN, date of birth, ID band Patient awake    Reviewed: Allergy & Precautions, NPO status , Patient's Chart, lab work & pertinent test results  Airway Mallampati: III  TM Distance: >3 FB Neck ROM: Full    Dental no notable dental hx. (+) Teeth Intact, Dental Advisory Given   Pulmonary neg pulmonary ROS   Pulmonary exam normal breath sounds clear to auscultation       Cardiovascular hypertension (119/88 preop), Pt. on medications Normal cardiovascular exam Rhythm:Regular Rate:Normal     Neuro/Psych  PSYCHIATRIC DISORDERS  Depression    TIA (06/2021)   GI/Hepatic negative GI ROS, Neg liver ROS,,,  Endo/Other  negative endocrine ROS    Renal/GU negative Renal ROS  negative genitourinary   Musculoskeletal negative musculoskeletal ROS (+)    Abdominal   Peds  Hematology negative hematology ROS (+)   Anesthesia Other Findings   Reproductive/Obstetrics negative OB ROS                             Anesthesia Physical Anesthesia Plan  ASA: 2  Anesthesia Plan: General   Post-op Pain Management: Tylenol PO (pre-op)* and Toradol IV (intra-op)*   Induction: Intravenous  PONV Risk Score and Plan: 3 and Ondansetron, Dexamethasone, Midazolam and Treatment may vary due to age or medical condition  Airway Management Planned: LMA  Additional Equipment: None  Intra-op Plan:   Post-operative Plan: Extubation in OR  Informed Consent: I have reviewed the patients History and Physical, chart, labs and discussed the procedure including the risks, benefits and alternatives for the proposed anesthesia with the patient or authorized representative who has indicated his/her understanding and acceptance.     Dental advisory given  Plan Discussed with: CRNA  Anesthesia Plan Comments:        Anesthesia Quick Evaluation

## 2022-04-24 ENCOUNTER — Other Ambulatory Visit: Payer: Self-pay

## 2022-04-24 ENCOUNTER — Ambulatory Visit (HOSPITAL_BASED_OUTPATIENT_CLINIC_OR_DEPARTMENT_OTHER)
Admission: RE | Admit: 2022-04-24 | Discharge: 2022-04-24 | Disposition: A | Discharge status: Home / Self Care | Payer: BC Managed Care – PPO | Attending: Surgery | Admitting: Surgery

## 2022-04-24 ENCOUNTER — Encounter (HOSPITAL_BASED_OUTPATIENT_CLINIC_OR_DEPARTMENT_OTHER)
Admission: RE | Disposition: A | Discharge status: Home / Self Care | Payer: Self-pay | Source: Home / Self Care | Attending: Surgery

## 2022-04-24 ENCOUNTER — Ambulatory Visit (HOSPITAL_BASED_OUTPATIENT_CLINIC_OR_DEPARTMENT_OTHER): Payer: BC Managed Care – PPO | Admitting: Anesthesiology

## 2022-04-24 ENCOUNTER — Encounter (HOSPITAL_BASED_OUTPATIENT_CLINIC_OR_DEPARTMENT_OTHER): Payer: Self-pay | Admitting: Surgery

## 2022-04-24 ENCOUNTER — Ambulatory Visit
Admission: RE | Admit: 2022-04-24 | Discharge: 2022-04-24 | Disposition: A | Discharge status: Home / Self Care | Payer: BC Managed Care – PPO | Source: Ambulatory Visit | Attending: Surgery | Admitting: Surgery

## 2022-04-24 DIAGNOSIS — Z8673 Personal history of transient ischemic attack (TIA), and cerebral infarction without residual deficits: Secondary | ICD-10-CM | POA: Insufficient documentation

## 2022-04-24 DIAGNOSIS — N6022 Fibroadenosis of left breast: Secondary | ICD-10-CM | POA: Diagnosis not present

## 2022-04-24 DIAGNOSIS — D0502 Lobular carcinoma in situ of left breast: Secondary | ICD-10-CM | POA: Insufficient documentation

## 2022-04-24 DIAGNOSIS — R928 Other abnormal and inconclusive findings on diagnostic imaging of breast: Secondary | ICD-10-CM | POA: Diagnosis not present

## 2022-04-24 DIAGNOSIS — N6092 Unspecified benign mammary dysplasia of left breast: Secondary | ICD-10-CM | POA: Diagnosis not present

## 2022-04-24 DIAGNOSIS — D242 Benign neoplasm of left breast: Secondary | ICD-10-CM | POA: Diagnosis not present

## 2022-04-24 DIAGNOSIS — Z01818 Encounter for other preprocedural examination: Secondary | ICD-10-CM

## 2022-04-24 DIAGNOSIS — I10 Essential (primary) hypertension: Secondary | ICD-10-CM | POA: Insufficient documentation

## 2022-04-24 HISTORY — PX: BREAST LUMPECTOMY WITH RADIOACTIVE SEED LOCALIZATION: SHX6424

## 2022-04-24 LAB — POCT PREGNANCY, URINE: Preg Test, Ur: NEGATIVE

## 2022-04-24 SURGERY — BREAST LUMPECTOMY WITH RADIOACTIVE SEED LOCALIZATION
Anesthesia: General | Site: Breast | Laterality: Left

## 2022-04-24 MED ORDER — PHENYLEPHRINE 80 MCG/ML (10ML) SYRINGE FOR IV PUSH (FOR BLOOD PRESSURE SUPPORT)
PREFILLED_SYRINGE | INTRAVENOUS | Status: AC
  Filled 2022-04-24: qty 10

## 2022-04-24 MED ORDER — ONDANSETRON HCL 4 MG/2ML IJ SOLN
INTRAMUSCULAR | Status: AC
  Filled 2022-04-24: qty 2

## 2022-04-24 MED ORDER — DEXAMETHASONE SODIUM PHOSPHATE 4 MG/ML IJ SOLN
INTRAMUSCULAR | Status: DC | PRN
  Administered 2022-04-24: 5 mg via INTRAVENOUS

## 2022-04-24 MED ORDER — TRAMADOL HCL 50 MG PO TABS: 50.0000 mg | ORAL_TABLET | Freq: Four times a day (QID) | ORAL | 0 refills | Status: DC | PRN

## 2022-04-24 MED ORDER — CHLORHEXIDINE GLUCONATE CLOTH 2 % EX PADS: 6.0000 | MEDICATED_PAD | Freq: Once | CUTANEOUS | Status: DC

## 2022-04-24 MED ORDER — PHENYLEPHRINE HCL (PRESSORS) 10 MG/ML IV SOLN
INTRAVENOUS | Status: DC | PRN
  Administered 2022-04-24: 80 ug via INTRAVENOUS

## 2022-04-24 MED ORDER — KETOROLAC TROMETHAMINE 30 MG/ML IJ SOLN
INTRAMUSCULAR | Status: AC
  Filled 2022-04-24: qty 1

## 2022-04-24 MED ORDER — OXYCODONE HCL 5 MG/5ML PO SOLN: 5.0000 mg | Freq: Once | ORAL | Status: DC | PRN

## 2022-04-24 MED ORDER — FENTANYL CITRATE (PF) 100 MCG/2ML IJ SOLN
INTRAMUSCULAR | Status: DC | PRN
  Administered 2022-04-24: 100 ug via INTRAVENOUS

## 2022-04-24 MED ORDER — BUPIVACAINE-EPINEPHRINE (PF) 0.5% -1:200000 IJ SOLN
INTRAMUSCULAR | Status: AC
  Filled 2022-04-24: qty 120

## 2022-04-24 MED ORDER — KETOROLAC TROMETHAMINE 30 MG/ML IJ SOLN: 30.0000 mg | Freq: Once | INTRAMUSCULAR | Status: DC | PRN

## 2022-04-24 MED ORDER — EPHEDRINE 5 MG/ML INJ
INTRAVENOUS | Status: AC
  Filled 2022-04-24: qty 5

## 2022-04-24 MED ORDER — HYDROMORPHONE HCL 1 MG/ML IJ SOLN: 0.2500 mg | INTRAMUSCULAR | Status: DC | PRN

## 2022-04-24 MED ORDER — LIDOCAINE 2% (20 MG/ML) 5 ML SYRINGE
INTRAMUSCULAR | Status: AC
  Filled 2022-04-24: qty 5

## 2022-04-24 MED ORDER — LACTATED RINGERS IV SOLN: INTRAVENOUS | Status: DC

## 2022-04-24 MED ORDER — KETOROLAC TROMETHAMINE 30 MG/ML IJ SOLN
INTRAMUSCULAR | Status: DC | PRN
  Administered 2022-04-24: 30 mg via INTRAVENOUS

## 2022-04-24 MED ORDER — OXYCODONE HCL 5 MG PO TABS: 5.0000 mg | ORAL_TABLET | Freq: Once | ORAL | Status: DC | PRN

## 2022-04-24 MED ORDER — ONDANSETRON HCL 4 MG/2ML IJ SOLN: 4.0000 mg | Freq: Once | INTRAMUSCULAR | Status: DC | PRN

## 2022-04-24 MED ORDER — LIDOCAINE HCL (CARDIAC) PF 100 MG/5ML IV SOSY
PREFILLED_SYRINGE | INTRAVENOUS | Status: DC | PRN
  Administered 2022-04-24: 60 mg via INTRAVENOUS

## 2022-04-24 MED ORDER — AMISULPRIDE (ANTIEMETIC) 5 MG/2ML IV SOLN: 10.0000 mg | Freq: Once | INTRAVENOUS | Status: DC | PRN

## 2022-04-24 MED ORDER — FENTANYL CITRATE (PF) 100 MCG/2ML IJ SOLN
INTRAMUSCULAR | Status: AC
  Filled 2022-04-24: qty 2

## 2022-04-24 MED ORDER — PROPOFOL 10 MG/ML IV BOLUS
INTRAVENOUS | Status: DC | PRN
  Administered 2022-04-24: 200 mg via INTRAVENOUS

## 2022-04-24 MED ORDER — SUCCINYLCHOLINE CHLORIDE 200 MG/10ML IV SOSY
PREFILLED_SYRINGE | INTRAVENOUS | Status: AC
  Filled 2022-04-24: qty 10

## 2022-04-24 MED ORDER — ATROPINE SULFATE 0.4 MG/ML IV SOLN
INTRAVENOUS | Status: AC
  Filled 2022-04-24: qty 1

## 2022-04-24 MED ORDER — ACETAMINOPHEN 500 MG PO TABS
1000.0000 mg | ORAL_TABLET | ORAL | Status: AC
  Administered 2022-04-24: 1000 mg via ORAL

## 2022-04-24 MED ORDER — DEXAMETHASONE SODIUM PHOSPHATE 10 MG/ML IJ SOLN
INTRAMUSCULAR | Status: AC
  Filled 2022-04-24: qty 1

## 2022-04-24 MED ORDER — MIDAZOLAM HCL 5 MG/5ML IJ SOLN
INTRAMUSCULAR | Status: DC | PRN
  Administered 2022-04-24: 2 mg via INTRAVENOUS

## 2022-04-24 MED ORDER — ONDANSETRON HCL 4 MG/2ML IJ SOLN
INTRAMUSCULAR | Status: DC | PRN
  Administered 2022-04-24: 4 mg via INTRAVENOUS

## 2022-04-24 MED ORDER — ACETAMINOPHEN 500 MG PO TABS
ORAL_TABLET | ORAL | Status: AC
  Filled 2022-04-24: qty 2

## 2022-04-24 MED ORDER — CEFAZOLIN SODIUM-DEXTROSE 2-4 GM/100ML-% IV SOLN
INTRAVENOUS | Status: AC
  Filled 2022-04-24: qty 100

## 2022-04-24 MED ORDER — MIDAZOLAM HCL 2 MG/2ML IJ SOLN
INTRAMUSCULAR | Status: AC
  Filled 2022-04-24: qty 2

## 2022-04-24 MED ORDER — BUPIVACAINE-EPINEPHRINE 0.5% -1:200000 IJ SOLN
INTRAMUSCULAR | Status: DC | PRN
  Administered 2022-04-24: 15 mL

## 2022-04-24 MED ORDER — MEPERIDINE HCL 25 MG/ML IJ SOLN: 6.2500 mg | INTRAMUSCULAR | Status: DC | PRN

## 2022-04-24 MED ORDER — CEFAZOLIN SODIUM-DEXTROSE 2-4 GM/100ML-% IV SOLN
2.0000 g | INTRAVENOUS | Status: AC
  Administered 2022-04-24: 2 g via INTRAVENOUS

## 2022-04-24 SURGICAL SUPPLY — 50 items
ADH SKN CLS APL DERMABOND .7 (GAUZE/BANDAGES/DRESSINGS) ×1
APL PRP STRL LF DISP 70% ISPRP (MISCELLANEOUS) ×1
APPLIER CLIP 9.375 MED OPEN (MISCELLANEOUS)
APR CLP MED 9.3 20 MLT OPN (MISCELLANEOUS)
BINDER BREAST 3XL (GAUZE/BANDAGES/DRESSINGS) IMPLANT
BINDER BREAST LRG (GAUZE/BANDAGES/DRESSINGS) IMPLANT
BINDER BREAST MEDIUM (GAUZE/BANDAGES/DRESSINGS) IMPLANT
BINDER BREAST XLRG (GAUZE/BANDAGES/DRESSINGS) IMPLANT
BINDER BREAST XXLRG (GAUZE/BANDAGES/DRESSINGS) IMPLANT
BLADE SURG 15 STRL LF DISP TIS (BLADE) ×1 IMPLANT
BLADE SURG 15 STRL SS (BLADE) ×1
CANISTER SUC SOCK COL 7IN (MISCELLANEOUS) IMPLANT
CANISTER SUCT 1200ML W/VALVE (MISCELLANEOUS) IMPLANT
CHLORAPREP W/TINT 26 (MISCELLANEOUS) ×1 IMPLANT
CLIP APPLIE 9.375 MED OPEN (MISCELLANEOUS) IMPLANT
COVER BACK TABLE 60X90IN (DRAPES) ×1 IMPLANT
COVER MAYO STAND STRL (DRAPES) ×1 IMPLANT
COVER PROBE CYLINDRICAL 5X96 (MISCELLANEOUS) ×1 IMPLANT
DERMABOND ADVANCED .7 DNX12 (GAUZE/BANDAGES/DRESSINGS) ×1 IMPLANT
DRAPE LAPAROSCOPIC ABDOMINAL (DRAPES) ×1 IMPLANT
DRAPE UTILITY XL STRL (DRAPES) ×1 IMPLANT
ELECT REM PT RETURN 9FT ADLT (ELECTROSURGICAL) ×1
ELECTRODE REM PT RTRN 9FT ADLT (ELECTROSURGICAL) ×1 IMPLANT
GAUZE SPONGE 4X4 12PLY STRL LF (GAUZE/BANDAGES/DRESSINGS) IMPLANT
GLOVE BIOGEL PI IND STRL 7.0 (GLOVE) IMPLANT
GLOVE BIOGEL PI IND STRL 7.5 (GLOVE) IMPLANT
GLOVE SURG SIGNA 7.5 PF LTX (GLOVE) ×1 IMPLANT
GLOVE SURG SS PI 6.5 STRL IVOR (GLOVE) IMPLANT
GOWN STRL REUS W/ TWL LRG LVL3 (GOWN DISPOSABLE) ×1 IMPLANT
GOWN STRL REUS W/ TWL XL LVL3 (GOWN DISPOSABLE) ×1 IMPLANT
GOWN STRL REUS W/TWL LRG LVL3 (GOWN DISPOSABLE) ×1
GOWN STRL REUS W/TWL XL LVL3 (GOWN DISPOSABLE) ×2
KIT MARKER MARGIN INK (KITS) ×1 IMPLANT
NDL HYPO 25X1 1.5 SAFETY (NEEDLE) ×1 IMPLANT
NEEDLE HYPO 25X1 1.5 SAFETY (NEEDLE) ×1 IMPLANT
NS IRRIG 1000ML POUR BTL (IV SOLUTION) IMPLANT
PACK BASIN DAY SURGERY FS (CUSTOM PROCEDURE TRAY) ×1 IMPLANT
PENCIL SMOKE EVACUATOR (MISCELLANEOUS) ×1 IMPLANT
SLEEVE SCD COMPRESS KNEE MED (STOCKING) ×1 IMPLANT
SPIKE FLUID TRANSFER (MISCELLANEOUS) IMPLANT
SPONGE T-LAP 4X18 ~~LOC~~+RFID (SPONGE) ×1 IMPLANT
SUT MNCRL AB 4-0 PS2 18 (SUTURE) ×1 IMPLANT
SUT SILK 2 0 SH (SUTURE) IMPLANT
SUT VIC AB 3-0 SH 27 (SUTURE) ×1
SUT VIC AB 3-0 SH 27X BRD (SUTURE) ×1 IMPLANT
SYR CONTROL 10ML LL (SYRINGE) ×1 IMPLANT
TOWEL GREEN STERILE FF (TOWEL DISPOSABLE) ×1 IMPLANT
TRAY FAXITRON CT DISP (TRAY / TRAY PROCEDURE) ×1 IMPLANT
TUBE CONNECTING 20X1/4 (TUBING) IMPLANT
YANKAUER SUCT BULB TIP NO VENT (SUCTIONS) IMPLANT

## 2022-04-24 NOTE — Anesthesia Procedure Notes (Signed)
Procedure Name: LMA Insertion Date/Time: 04/24/2022 7:30 AM  Performed by: Willa Frater, CRNAPre-anesthesia Checklist: Patient identified, Emergency Drugs available, Suction available and Patient being monitored Patient Re-evaluated:Patient Re-evaluated prior to induction Oxygen Delivery Method: Circle system utilized Preoxygenation: Pre-oxygenation with 100% oxygen Induction Type: IV induction Ventilation: Mask ventilation without difficulty LMA: LMA inserted LMA Size: 4.0 Number of attempts: 1 Airway Equipment and Method: Bite block Placement Confirmation: positive ETCO2 Tube secured with: Tape Dental Injury: Teeth and Oropharynx as per pre-operative assessment

## 2022-04-24 NOTE — Op Note (Signed)
LEFT BREAST LUMPECTOMY WITH RADIOACTIVE SEED LOCALIZATION  Procedure Note  Jennifer Haas [id number]   Pre-op Diagnosis: LEFT BREAST ATYPICAL HYPERPLASIA, INTRADUCTAL PAPILLOMA     Post-op Diagnosis: same  Procedure(s): LEFT BREAST LUMPECTOMY WITH RADIOACTIVE SEED LOCALIZATION  Surgeon(s): Coralie Keens, MD  Anesthesia: General  Staff:  Circulator: Izora Ribas, RN Scrub Person: Claudina Lick, RN  Estimated Blood Loss: Minimal               Specimens:sent to path  Indications: This is a 46 year old female was found to have calcifications on recent screening mammography in the upper outer quadrant of the left breast.  She has had a previous papilloma removed in the past.  She underwent a biopsy of this area that showed another intraductal papilloma as well as atypical lobular hyperplasia.  Given the atypia, the decision was made to proceed with a lumpectomy  Procedure: The patient was brought to the operating room identified as correct patient.  She was placed upon the operating table general anesthesia was induced.  Her left breast was then prepped and draped in usual sterile fashion.  I then applied a radioactive seed in the upper outer quadrant of the left breast with the neoprobe.  It was fairly lateral to the nipple-areolar complex.  I elected to make the incision over this area.  I anesthetized skin with Marcaine and made incision with a scalpel.  With the neoprobe I then dissected down toward the radioactive seed attempting to stay wildly around it with the electrocautery.  I was then able to come underneath the signal from the neoprobe and complete the lumpectomy with the cautery.  I marked all margins on the lumpectomy specimen with paint.  And x-rays performed confirming the previous tissue biopsy marker and radioactive seed were in the specimen.  The specimen was then sent to pathology for evaluation.  I achieved hemostasis with the  cautery.  I anesthetized the incision further with Marcaine.  I then closed the subcutaneous tissue with interrupted 3-0 Vicryl sutures and I closed the skin with a running 4-0 Monocryl.  Dermabond was then applied.  The patient tolerated the procedure well.  All the counts were correct at the end of the procedure.  The patient was then extubated in the operating room and taken in a stable condition to the recovery room.          Coralie Keens   Date: 04/24/2022  Time: 7:59 AM

## 2022-04-24 NOTE — Transfer of Care (Signed)
Immediate Anesthesia Transfer of Care Note  Patient: Jennifer Haas  Procedure(s) Performed: LEFT BREAST LUMPECTOMY WITH RADIOACTIVE SEED LOCALIZATION (Left: Breast)  Patient Location: PACU  Anesthesia Type:General  Level of Consciousness: sedated  Airway & Oxygen Therapy: Patient Spontanous Breathing and Patient connected to face mask oxygen  Post-op Assessment: Report given to RN and Post -op Vital signs reviewed and stable  Post vital signs: Reviewed and stable  Last Vitals:  Vitals Value Taken Time  BP    Temp    Pulse 90 04/24/22 0804  Resp    SpO2 100 % 04/24/22 0804  Vitals shown include unvalidated device data.  Last Pain:  Vitals:   04/24/22 0626  TempSrc: Oral  PainSc: 0-No pain      Patients Stated Pain Goal: 1 (28/20/81 3887)  Complications: No notable events documented.

## 2022-04-24 NOTE — Interval H&P Note (Signed)
History and Physical Interval Note:no change in H and P  04/24/2022 7:03 AM  Jennifer Haas  has presented today for surgery, with the diagnosis of LEFT BREAST ATYPICAL HYPERPLASIA, INTRADUCTAL PAPILLOMA.  The various methods of treatment have been discussed with the patient and family. After consideration of risks, benefits and other options for treatment, the patient has consented to  Procedure(s) with comments: LEFT BREAST LUMPECTOMY WITH RADIOACTIVE SEED LOCALIZATION (Left) - LMA as a surgical intervention.  The patient's history has been reviewed, patient examined, no change in status, stable for surgery.  I have reviewed the patient's chart and labs.  Questions were answered to the patient's satisfaction.     Coralie Keens

## 2022-04-24 NOTE — Anesthesia Postprocedure Evaluation (Signed)
Anesthesia Post Note  Patient: Jennifer Haas  Procedure(s) Performed: LEFT BREAST LUMPECTOMY WITH RADIOACTIVE SEED LOCALIZATION (Left: Breast)     Patient location during evaluation: PACU Anesthesia Type: General Level of consciousness: awake and alert, oriented and patient cooperative Pain management: pain level controlled Vital Signs Assessment: post-procedure vital signs reviewed and stable Respiratory status: spontaneous breathing, nonlabored ventilation and respiratory function stable Cardiovascular status: blood pressure returned to baseline and stable Postop Assessment: no apparent nausea or vomiting Anesthetic complications: no   No notable events documented.  Last Vitals:  Vitals:   04/24/22 0830 04/24/22 0845  BP: 113/68 99/66  Pulse: 87 77  Resp: 16 16  Temp:  36.9 C  SpO2: 99% 100%    Last Pain:  Vitals:   04/24/22 0845  TempSrc:   PainSc: 0-No pain                 Pervis Hocking

## 2022-04-24 NOTE — Discharge Instructions (Addendum)
[facility] Phone Number [phone]  BREAST BIOPSY/ PARTIAL MASTECTOMY: POST OP INSTRUCTIONS  Always review your discharge instruction sheet given to you by the facility where your surgery was performed.  IF YOU HAVE DISABILITY OR FAMILY LEAVE FORMS, YOU MUST BRING THEM TO THE OFFICE FOR PROCESSING.  DO NOT GIVE THEM TO YOUR DOCTOR.  A prescription for pain medication may be given to you upon discharge.  Take your pain medication as prescribed, if needed.  If narcotic pain medicine is not needed, then you may take acetaminophen (Tylenol) or ibuprofen (Advil) as needed. Take your usually prescribed medications unless otherwise directed If you need a refill on your pain medication, please contact your pharmacy.  They will contact our office to request authorization.  Prescriptions will not be filled after 5pm or on week-ends. You should eat very light the first 24 hours after surgery, such as soup, crackers, pudding, etc.  Resume your normal diet the day after surgery. Most patients will experience some swelling and bruising in the breast.  Ice packs and a good support bra will help.  Swelling and bruising can take several days to resolve.  It is common to experience some constipation if taking pain medication after surgery.  Increasing fluid intake and taking a stool softener will usually help or prevent this problem from occurring.  A mild laxative (Milk of Magnesia or Miralax) should be taken according to package directions if there are no bowel movements after 48 hours. Unless discharge instructions indicate otherwise, you may remove your bandages 24-48 hours after surgery, and you may shower at that time.  You may have steri-strips (small skin tapes) in place directly over the incision.  These strips should be left on the skin for 7-10 days.  If your surgeon used skin glue on the incision, you may shower in 24 hours.  The glue will flake off over the next 2-3 weeks.  Any  sutures or staples will be removed at the office during your follow-up visit. ACTIVITIES:  You may resume regular daily activities (gradually increasing) beginning the next day.  Wearing a good support bra or sports bra minimizes pain and swelling.  You may have sexual intercourse when it is comfortable. You may drive when you no longer are taking prescription pain medication, you can comfortably wear a seatbelt, and you can safely maneuver your car and apply brakes. RETURN TO WORK:  ______________________________________________________________________________________ Jennifer Haas should see your doctor in the office for a follow-up appointment approximately two weeks after your surgery.  Your doctor's nurse will typically make your follow-up appointment when she calls you with your pathology report.  Expect your pathology report 2-3 business days after your surgery.  You may call to check if you do not hear from [place] after three days. OTHER INSTRUCTIONS: YOU MAY SHOWER STARTING TOMORROW ICE PACK, TYLENOL, AND IBUPROFEN ALSO FOR PAIN. No Tylenol until after 12:30pm today. No Ibuprofen until after 1:40pm today. NO VIGOROUS ACTIVITY FOR ONE WEEK _______________________________________________________________________________________________ _____________________________________________________________________________________________________________________________________ _____________________________________________________________________________________________________________________________________ _____________________________________________________________________________________________________________________________________  WHEN TO CALL YOUR DOCTOR: Fever over 101.0 Nausea and/or vomiting. Extreme swelling or bruising. Continued bleeding from incision. Increased pain, redness, or drainage from the incision.  The clinic staff is available to answer your questions during regular business hours.   Please don't hesitate to call and ask to speak to one of the nurses for clinical concerns.  If you have a medical emergency, go to the nearest emergency room or call 911.  A surgeon from [facility] is  always on call at the hospital.  For further questions, please visit [email or web address]    Post Anesthesia Home Care Instructions  Activity: Get plenty of rest for the remainder of the day. A responsible individual must stay with you for 24 hours following the procedure.  For the next 24 hours, DO NOT: -Drive a car -Paediatric nurse -Drink alcoholic beverages -Take any medication unless instructed by your physician -Make any legal decisions or sign important papers.  Meals: Start with liquid foods such as gelatin or soup. Progress to regular foods as tolerated. Avoid greasy, spicy, heavy foods. If nausea and/or vomiting occur, drink only clear liquids until the nausea and/or vomiting subsides. Call your physician if vomiting continues.  Special Instructions/Symptoms: Your throat may feel dry or sore from the anesthesia or the breathing tube placed in your throat during surgery. If this causes discomfort, gargle with warm salt water. The discomfort should disappear within 24 hours.  If you had a scopolamine patch placed behind your ear for the management of post- operative nausea and/or vomiting:  1. The medication in the patch is effective for 72 hours, after which it should be removed.  Wrap patch in a tissue and discard in the trash. Wash hands thoroughly with soap and water. 2. You may remove the patch earlier than 72 hours if you experience unpleasant side effects which may include dry mouth, dizziness or visual disturbances. 3. Avoid touching the patch. Wash your hands with soap and water after contact with the patch.

## 2022-04-25 ENCOUNTER — Encounter (HOSPITAL_BASED_OUTPATIENT_CLINIC_OR_DEPARTMENT_OTHER): Payer: Self-pay | Admitting: Surgery

## 2022-04-29 LAB — SURGICAL PATHOLOGY

## 2022-05-22 ENCOUNTER — Encounter (HOSPITAL_COMMUNITY): Payer: Self-pay

## 2022-06-04 ENCOUNTER — Telehealth: Payer: Self-pay | Admitting: Hematology and Oncology

## 2022-06-04 NOTE — Telephone Encounter (Signed)
Scheduled appt per 1/30 referral. Pt is aware of appt date and time. Pt is aware to arrive 15 mins prior to appt time and to bring and updated insurance card. Pt is aware of appt location.

## 2022-06-30 ENCOUNTER — Encounter: Payer: Self-pay | Admitting: *Deleted

## 2022-06-30 ENCOUNTER — Inpatient Hospital Stay: Payer: BC Managed Care – PPO | Attending: Hematology and Oncology | Admitting: Hematology and Oncology

## 2022-06-30 ENCOUNTER — Other Ambulatory Visit: Payer: Self-pay

## 2022-06-30 VITALS — BP 128/82 | HR 85 | Temp 97.7°F | Resp 17 | Wt 181.2 lb

## 2022-06-30 DIAGNOSIS — I10 Essential (primary) hypertension: Secondary | ICD-10-CM | POA: Insufficient documentation

## 2022-06-30 DIAGNOSIS — D0502 Lobular carcinoma in situ of left breast: Secondary | ICD-10-CM | POA: Insufficient documentation

## 2022-06-30 MED ORDER — TAMOXIFEN CITRATE 10 MG PO TABS: 5.0000 mg | ORAL_TABLET | Freq: Every day | ORAL | 1 refills | Status: DC

## 2022-06-30 NOTE — Assessment & Plan Note (Signed)
04/24/2022: Left lumpectomy: Benign intraductal and sclerosing papilloma.  Focal LCIS and ALH, margins negative  Pathology counseling: I discussed with her the difference between LCIS and invasive cancer. LCIS is a risk factor for breast cancer and not a premalignant condition. Hence it does not need to be resected. However she is at higher than normal risk of breast cancer. Risk of invasive and noninvasive breast cancer with LCIS is 1% per year. Her cumulative risk lifetime would be around **%. Tamoxifen would reduce this risk by half. This is based on NSABP P-1 clinical trial  Risk reduction strategies: 1. Tamoxifen is indicated to decrease the risk of another noninvasive or invasive breast cancer. Patient fully understands that neither of these medications would prolong her life but certainly help decrease recurrence rate by 50% . 2. Recommended annual mammograms and breast MRIs  Tamoxifen toxicities:We discussed the risks and benefits of tamoxifen. These include but not limited to insomnia, hot flashes, mood changes, vaginal dryness, and weight gain. Although rare, serious side effects including endometrial cancer, risk of blood clots were also discussed. We strongly believe that the benefits far outweigh the risks. Patient understands these risks and consented to starting treatment. Planned treatment duration is 5 years.  I recommended that she take a 5 mg dose based upon TAM-01 clinical trial  Telephone visit in 3 months to discuss tolerance to tamoxifen  She will need annual mammograms and breast MRIs.  We will set her up for breast MRI in 6 months.

## 2022-06-30 NOTE — Progress Notes (Signed)
[facility] CONSULT NOTE  Patient Care Team: Eugenia Pancoast, FNP as PCP - General (Family Medicine)  CHIEF COMPLAINTS/PURPOSE OF CONSULTATION:  Newly diagnosed left breast LCIS and ALH  HISTORY OF PRESENTING ILLNESS:  Jennifer Haas 47 y.o. female is here because of recent diagnosis of left breast LCIS and ALH.  She had a routine mammogram at the end of September 2023 when she was found to have calcifications which led to additional imaging studies and biopsy.  She finally underwent a left lumpectomy on 04/24/2022 that revealed focal LCIS and ALH with clear margins.  She was referred to [place] for high risk breast clinic to discuss risk reduction as well as surveillance plan.  Patient does not have any family history of breast cancer.  I reviewed her records extensively and collaborated the history with the patient.   MEDICAL HISTORY:  Past Medical History:  Diagnosis Date   Heart murmur    Hypertension    on meds   PVC's (premature ventricular contractions)    Suicide attempt ([place])    06/2018   TIA (transient ischemic attack) 06/2021    SURGICAL HISTORY: Past Surgical History:  Procedure Laterality Date   ABDOMINAL CERCLAGE  05/29/2011   Procedure: CERCLAGE ABDOMINAL;  Surgeon: Darlyn Chamber, MD;  Location: [facility];  Service: Gynecology;  Laterality: N/A;  need to use mersilene band   ABDOMINAL CERCLAGE  10/20/2011   Procedure: CERCLAGE ABDOMINAL;  Surgeon: Cyril Mourning, MD;  Location: [facility];  Service: Gynecology;  Laterality: N/A;  Removal of abdominal cerclage   BREAST BIOPSY  04/23/2022   MM LT RADIOACTIVE SEED LOC MAMMO GUIDE 04/23/2022 [facility]   BREAST EXCISIONAL BIOPSY  2020   BREAST LUMPECTOMY WITH RADIOACTIVE SEED LOCALIZATION Left 11/04/2018   Procedure: LEFT BREAST LUMPECTOMY WITH RADIOACTIVE SEED GUIDED LOCALIZATION;  Surgeon: Coralie Keens, MD;  Location: [facility];  Service: General;  Laterality: Left;    BREAST LUMPECTOMY WITH RADIOACTIVE SEED LOCALIZATION Left 04/24/2022   Procedure: LEFT BREAST LUMPECTOMY WITH RADIOACTIVE SEED LOCALIZATION;  Surgeon: Coralie Keens, MD;  Location: [facility];  Service: General;  Laterality: Left;  LMA   CESAREAN SECTION  2013   DILATION AND CURETTAGE OF UTERUS  1997   LEEP     TUBAL LIGATION  2013   WISDOM TOOTH EXTRACTION  2006    SOCIAL HISTORY: Social History   Socioeconomic History   Marital status: Married    Spouse name: Not on file   Number of children: 4   Years of education: Not on file   Highest education level: Associate degree: academic program  Occupational History   Not on file  Tobacco Use   Smoking status: Never   Smokeless tobacco: Never  Vaping Use   Vaping Use: Never used  Substance and Sexual Activity   Alcohol use: Yes    Comment: 1 drink per month   Drug use: No   Sexual activity: Yes    Partners: Male    Birth control/protection: Surgical  Other Topics Concern   Not on file  Social History Narrative   Not on file   Social Determinants of Health   Financial Resource Strain: Not on file  Food Insecurity: Not on file  Transportation Needs: No Transportation Needs (09/30/2018)   PRAPARE - Transportation    Lack of Transportation (Medical): No    Lack of Transportation (Non-Medical): No  Physical Activity: Not on file  Stress: Not on file  Social  Connections: Not on file  Intimate Partner Violence: Not on file    FAMILY HISTORY: Family History  Problem Relation Age of Onset   Diabetes Mother    Diabetes Father    Heart disease Father    Hypertension Father    Alzheimer's disease Maternal Grandmother    Heart attack Maternal Grandfather    Stroke Paternal Grandmother    Breast cancer Neg Hx    Colon polyps Neg Hx    Colon cancer Neg Hx    Esophageal cancer Neg Hx    Stomach cancer Neg Hx    Rectal cancer Neg Hx     ALLERGIES:  has No Known Allergies.  MEDICATIONS:  Current  Outpatient Medications  Medication Sig Dispense Refill   tamoxifen (NOLVADEX) 10 MG tablet Take 0.5 tablets (5 mg total) by mouth daily. 90 tablet 1   aspirin 81 MG chewable tablet Chew 81 mg by mouth daily.     atorvastatin (LIPITOR) 20 MG tablet Take 1 tablet by mouth daily.     cyclobenzaprine (FLEXERIL) 10 MG tablet Take 5-10 mg by mouth 3 (three) times daily as needed.     ergocalciferol (VITAMIN D2) 1.25 MG (50000 UT) capsule Take 50,000 Units by mouth once a week.     ibuprofen (ADVIL) 800 MG tablet Take 800 mg by mouth every 8 (eight) hours as needed.     lisinopril (PRINIVIL,ZESTRIL) 10 MG tablet Take 1 tablet (10 mg total) by mouth daily. (Resume as instructed by your outpatient provider): For hypertension     oxybutynin (DITROPAN-XL) 5 MG 24 hr tablet Take 5 mg by mouth as needed.     traMADol (ULTRAM) 50 MG tablet Take 1 tablet (50 mg total) by mouth every 6 (six) hours as needed. 20 tablet 0   No current facility-administered medications for this visit.    REVIEW OF SYSTEMS:   Constitutional: Denies fevers, chills or abnormal night sweats   All other systems were reviewed with the patient and are negative.  PHYSICAL EXAMINATION: ECOG PERFORMANCE STATUS: 0 - Asymptomatic  Vitals:   06/30/22 1520  BP: 128/82  Pulse: 85  Resp: 17  Temp: 97.7 F (36.5 C)  SpO2: 100%   Filed Weights   06/30/22 1520  Weight: 181 lb 3.2 oz (82.2 kg)    GENERAL:alert, no distress and comfortable    LABORATORY DATA:  I have reviewed the data as listed Lab Results  Component Value Date   WBC 4.1 04/04/2022   HGB 13.0 04/04/2022   HCT 39.3 04/04/2022   MCV 86.1 04/04/2022   PLT 325.0 04/04/2022   Lab Results  Component Value Date   NA 138 04/04/2022   K 3.5 04/04/2022   CL 100 04/04/2022   CO2 29 04/04/2022    RADIOGRAPHIC STUDIES: I have personally reviewed the radiological reports and agreed with the findings in the report.  ASSESSMENT AND PLAN:  Lobular carcinoma in  situ (LCIS) of left breast 04/24/2022: Left lumpectomy: Benign intraductal and sclerosing papilloma.  Focal LCIS and ALH, margins negative  Pathology counseling: I discussed with her the difference between LCIS and invasive cancer. LCIS is a risk factor for breast cancer and not a premalignant condition. Hence it does not need to be resected. However she is at higher than normal risk of breast cancer. Risk of invasive and noninvasive breast cancer with LCIS is 1% per year. Her cumulative risk lifetime would be around **%. Tamoxifen would reduce this risk by half. This is based on  NSABP P-1 clinical trial  Risk reduction strategies: 1. Tamoxifen is indicated to decrease the risk of another noninvasive or invasive breast cancer. Patient fully understands that neither of these medications would prolong her life but certainly help decrease recurrence rate by 50% . 2. Recommended annual mammograms and breast MRIs  Tamoxifen toxicities:We discussed the risks and benefits of tamoxifen. These include but not limited to insomnia, hot flashes, mood changes, vaginal dryness, and weight gain. Although rare, serious side effects including endometrial cancer, risk of blood clots were also discussed. We strongly believe that the benefits far outweigh the risks. Patient understands these risks and consented to starting treatment. Planned treatment duration is 5 years.  I recommended that she take a 5 mg dose based upon TAM-01 clinical trial  Telephone visit in 3 months to discuss tolerance to tamoxifen  She will need annual mammograms and breast MRIs.  We will set her up for breast MRI in 6 months.   All questions were answered. The patient knows to call the clinic with any problems, questions or concerns.    Harriette Ohara, MD 06/30/22

## 2022-07-02 DIAGNOSIS — R1031 Right lower quadrant pain: Secondary | ICD-10-CM | POA: Diagnosis not present

## 2022-07-02 DIAGNOSIS — R109 Unspecified abdominal pain: Secondary | ICD-10-CM | POA: Diagnosis not present

## 2022-09-05 ENCOUNTER — Other Ambulatory Visit: Payer: Self-pay | Admitting: Family

## 2022-09-25 NOTE — Progress Notes (Signed)
HEMATOLOGY-ONCOLOGY TELEPHONE VISIT PROGRESS NOTE  I connected with our patient on 09/30/22 at  3:15 PM EDT by telephone and verified that I am speaking with the correct person using two identifiers.  I discussed the limitations, risks, security and privacy concerns of performing an evaluation and management service by telephone and the availability of in person appointments.  I also discussed with the patient that there may be a patient responsible charge related to this service. The patient expressed understanding and agreed to proceed.   History of Present Illness: Jennifer Haas 47 y.o. female is here because of recent diagnosis of left breast LCIS and ALH. She presents to the clinic for a telephone follow-up to discuss tolerance to tamoxifen.  She is tolerating tamoxifen extremely well without any problems or concerns.  She has a breast MRI set up for August.  She has claustrophobia issues she is requesting something for sedation.    REVIEW OF SYSTEMS:   Constitutional: Denies fevers, chills or abnormal weight loss All other systems were reviewed with the patient and are negative. Observations/Objective:     Assessment Plan:  Lobular carcinoma in situ (LCIS) of left breast 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  Breast cancer surveillance: Annual mammograms and breast MRIs (12/29/2022).  I sent a prescription for lorazepam for 4 tablets as premedications for the breast MRI.  Return to clinic in 1 year for follow-up    I discussed the assessment and treatment plan with the patient. The patient was provided an opportunity to ask questions and all were answered. The patient agreed with the plan and demonstrated an understanding  of the instructions. The patient was advised to call back or seek an in-person evaluation if the symptoms worsen or if the condition fails to improve as anticipated.   I provided 12 minutes of non-face-to-face time during this encounter.  This includes time for charting and coordination of care   Tamsen Meek, MD  I Janan Ridge am acting as a scribe for Dr.Vinay Gudena  I have reviewed the above documentation for accuracy and completeness, and I agree with the above.

## 2022-09-30 ENCOUNTER — Inpatient Hospital Stay: Payer: BC Managed Care – PPO | Attending: Hematology and Oncology | Admitting: Hematology and Oncology

## 2022-09-30 DIAGNOSIS — D0502 Lobular carcinoma in situ of left breast: Secondary | ICD-10-CM

## 2022-09-30 MED ORDER — LORAZEPAM 0.5 MG PO TABS: 0.5000 mg | ORAL_TABLET | Freq: Every day | ORAL | 0 refills | Status: DC

## 2022-09-30 NOTE — Assessment & Plan Note (Signed)
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  Breast cancer surveillance: Annual mammograms and breast MRIs (12/29/2022).  I sent a prescription for lorazepam for 4 tablets as premedications for the breast MRI.  Return to clinic in 1 year for follow-up

## 2022-11-18 ENCOUNTER — Telehealth: Payer: BC Managed Care – PPO | Admitting: Family

## 2022-11-18 ENCOUNTER — Encounter: Payer: Self-pay | Admitting: Family

## 2022-11-18 VITALS — BP 118/74 | Ht 65.0 in | Wt 172.0 lb

## 2022-11-18 DIAGNOSIS — D0502 Lobular carcinoma in situ of left breast: Secondary | ICD-10-CM | POA: Diagnosis not present

## 2022-11-18 DIAGNOSIS — G479 Sleep disorder, unspecified: Secondary | ICD-10-CM | POA: Diagnosis not present

## 2022-11-18 DIAGNOSIS — M62838 Other muscle spasm: Secondary | ICD-10-CM

## 2022-11-18 DIAGNOSIS — I10 Essential (primary) hypertension: Secondary | ICD-10-CM

## 2022-11-18 MED ORDER — TRAZODONE HCL 50 MG PO TABS: 25.0000 mg | ORAL_TABLET | Freq: Every evening | ORAL | 2 refills | Status: DC | PRN

## 2022-11-18 MED ORDER — CYCLOBENZAPRINE HCL 10 MG PO TABS: 5.0000 mg | ORAL_TABLET | Freq: Three times a day (TID) | ORAL | 0 refills | Status: DC | PRN

## 2022-11-18 NOTE — Assessment & Plan Note (Signed)
Cont f/u with oncology as scheduled.

## 2022-11-18 NOTE — Assessment & Plan Note (Signed)
Stop tylenol pm and melatonin  Trial trazodone 50 mg nightly

## 2022-11-18 NOTE — Progress Notes (Signed)
Virtual Visit via Video note  I connected with Jennifer Haas on 11/18/22 at work by video and verified that I am speaking with the correct person using two identifiers.The provider, Mort Sawyers, FNP is located in their office at time of visit.  I discussed the limitations, risks, security and privacy concerns of performing an evaluation and management service by video and the availability of in person appointments. I also discussed with the patient that there may be a patient responsible charge related to this service. The patient expressed understanding and agreed to proceed.  Subjective: PCP: Mort Sawyers, FNP  Chief Complaint  Patient presents with   Insomnia    Insomnia    Having trouble with sleeping.  She states hard to fall asleep and then to stay asleep.  Tried an increased dose of melatonin which helped slightly  She has tried tylenol pm as well so it will help her go to sleep but she doesn't want to continue taking them because she is worried about her liver. She has tried trazodone in the past for sleep, and does state that it helped her.   Muscle spasms: flares at times.  Takes flexeril prn, takes about once monthly when it flares up   Recent dx of LCIS and ALH breast. She left lumpectomy and is on tamoxifen.    ROS: Per HPI  Current Outpatient Medications:    aspirin 81 MG chewable tablet, Chew 81 mg by mouth daily., Disp: , Rfl:    atorvastatin (LIPITOR) 20 MG tablet, Take 1 tablet by mouth daily., Disp: , Rfl:    ergocalciferol (VITAMIN D2) 1.25 MG (50000 UT) capsule, Take 50,000 Units by mouth once a week., Disp: , Rfl:    ibuprofen (ADVIL) 800 MG tablet, Take 800 mg by mouth every 8 (eight) hours as needed., Disp: , Rfl:    lisinopril (PRINIVIL,ZESTRIL) 10 MG tablet, Take 1 tablet (10 mg total) by mouth daily. (Resume as instructed by your outpatient provider): For hypertension, Disp: , Rfl:    tamoxifen (NOLVADEX) 10 MG tablet, Take 0.5 tablets  (5 mg total) by mouth daily., Disp: 90 tablet, Rfl: 1   traZODone (DESYREL) 50 MG tablet, Take 0.5-1 tablets (25-50 mg total) by mouth at bedtime as needed for sleep., Disp: 30 tablet, Rfl: 2   cyclobenzaprine (FLEXERIL) 10 MG tablet, Take 0.5-1 tablets (5-10 mg total) by mouth 3 (three) times daily as needed., Disp: 30 tablet, Rfl: 0  Observations/Objective: Physical Exam Constitutional:      General: She is not in acute distress.    Appearance: Normal appearance. She is normal weight. She is not ill-appearing, toxic-appearing or diaphoretic.  HENT:     Head: Normocephalic.  Cardiovascular:     Rate and Rhythm: Normal rate.  Pulmonary:     Effort: Pulmonary effort is normal.  Musculoskeletal:        General: Normal range of motion.  Neurological:     General: No focal deficit present.     Mental Status: She is alert and oriented to person, place, and time. Mental status is at baseline.  Psychiatric:        Mood and Affect: Mood normal.        Behavior: Behavior normal.        Thought Content: Thought content normal.        Judgment: Judgment normal.     Assessment and Plan: Breast neoplasm, Tis (LCIS), left Assessment & Plan: Cont f/u with oncology as scheduled.  Sleep disorder Assessment & Plan: Stop tylenol pm and melatonin  Trial trazodone 50 mg nightly    Orders: -     traZODone HCl; Take 0.5-1 tablets (25-50 mg total) by mouth at bedtime as needed for sleep.  Dispense: 30 tablet; Refill: 2  Muscle spasm -     Cyclobenzaprine HCl; Take 0.5-1 tablets (5-10 mg total) by mouth 3 (three) times daily as needed.  Dispense: 30 tablet; Refill: 0    Follow Up Instructions: Return in about 6 months (around 05/21/2023) for f/u CPE.   I discussed the assessment and treatment plan with the patient. The patient was provided an opportunity to ask questions and all were answered. The patient agreed with the plan and demonstrated an understanding of the instructions.   The  patient was advised to call back or seek an in-person evaluation if the symptoms worsen or if the condition fails to improve as anticipated.  The above assessment and management plan was discussed with the patient. The patient verbalized understanding of and has agreed to the management plan. Patient is aware to call the clinic if symptoms persist or worsen. Patient is aware when to return to the clinic for a follow-up visit. Patient educated on when it is appropriate to go to the emergency department.     Mort Sawyers, MSN, APRN, FNP-C [facility]

## 2022-12-01 ENCOUNTER — Encounter: Payer: Self-pay | Admitting: Hematology and Oncology

## 2022-12-21 ENCOUNTER — Other Ambulatory Visit: Payer: Self-pay | Admitting: Family

## 2022-12-21 DIAGNOSIS — G479 Sleep disorder, unspecified: Secondary | ICD-10-CM

## 2022-12-29 ENCOUNTER — Ambulatory Visit: Admission: RE | Admit: 2022-12-29 | Payer: BC Managed Care – PPO | Source: Ambulatory Visit

## 2022-12-29 DIAGNOSIS — Z1239 Encounter for other screening for malignant neoplasm of breast: Secondary | ICD-10-CM | POA: Diagnosis not present

## 2022-12-29 DIAGNOSIS — D0502 Lobular carcinoma in situ of left breast: Secondary | ICD-10-CM

## 2022-12-29 MED ORDER — GADOPICLENOL 0.5 MMOL/ML IV SOLN
7.5000 mL | Freq: Once | INTRAVENOUS | Status: AC | PRN
  Administered 2022-12-29: 7.5 mL via INTRAVENOUS

## 2023-01-06 ENCOUNTER — Other Ambulatory Visit: Payer: Self-pay | Admitting: *Deleted

## 2023-01-06 DIAGNOSIS — I10 Essential (primary) hypertension: Secondary | ICD-10-CM

## 2023-01-06 MED ORDER — LISINOPRIL 10 MG PO TABS: 10.0000 mg | ORAL_TABLET | Freq: Every day | ORAL | 3 refills | Status: DC

## 2023-01-06 MED ORDER — LISINOPRIL 10 MG PO TABS
10.0000 mg | ORAL_TABLET | Freq: Every day | ORAL | 3 refills | Status: DC
Start: 2023-01-06 — End: 2023-01-06

## 2023-01-16 ENCOUNTER — Other Ambulatory Visit: Payer: Self-pay

## 2023-01-16 ENCOUNTER — Emergency Department (HOSPITAL_BASED_OUTPATIENT_CLINIC_OR_DEPARTMENT_OTHER): Payer: BC Managed Care – PPO

## 2023-01-16 ENCOUNTER — Emergency Department (HOSPITAL_BASED_OUTPATIENT_CLINIC_OR_DEPARTMENT_OTHER)
Admission: EM | Admit: 2023-01-16 | Discharge: 2023-01-16 | Disposition: A | Discharge status: Home / Self Care | Payer: BC Managed Care – PPO | Attending: Emergency Medicine | Admitting: Emergency Medicine

## 2023-01-16 DIAGNOSIS — R0981 Nasal congestion: Secondary | ICD-10-CM | POA: Diagnosis not present

## 2023-01-16 DIAGNOSIS — R051 Acute cough: Secondary | ICD-10-CM | POA: Insufficient documentation

## 2023-01-16 DIAGNOSIS — Z7982 Long term (current) use of aspirin: Secondary | ICD-10-CM | POA: Diagnosis not present

## 2023-01-16 DIAGNOSIS — Z1152 Encounter for screening for COVID-19: Secondary | ICD-10-CM | POA: Insufficient documentation

## 2023-01-16 DIAGNOSIS — R059 Cough, unspecified: Secondary | ICD-10-CM | POA: Diagnosis not present

## 2023-01-16 DIAGNOSIS — Z853 Personal history of malignant neoplasm of breast: Secondary | ICD-10-CM | POA: Diagnosis not present

## 2023-01-16 LAB — CBC
HCT: 33.7 % — ABNORMAL LOW (ref 36.0–46.0)
Hemoglobin: 11 g/dL — ABNORMAL LOW (ref 12.0–15.0)
MCH: 27.2 pg (ref 26.0–34.0)
MCHC: 32.6 g/dL (ref 30.0–36.0)
MCV: 83.2 fL (ref 80.0–100.0)
Platelets: 295 10*3/uL (ref 150–400)
RBC: 4.05 MIL/uL (ref 3.87–5.11)
RDW: 14.2 % (ref 11.5–15.5)
WBC: 4.6 10*3/uL (ref 4.0–10.5)
nRBC: 0 % (ref 0.0–0.2)

## 2023-01-16 LAB — BASIC METABOLIC PANEL
Anion gap: 7 (ref 5–15)
BUN: 9 mg/dL (ref 6–20)
CO2: 25 mmol/L (ref 22–32)
Calcium: 8.6 mg/dL — ABNORMAL LOW (ref 8.9–10.3)
Chloride: 106 mmol/L (ref 98–111)
Creatinine, Ser: 0.46 mg/dL (ref 0.44–1.00)
GFR, Estimated: >60 mL/min (ref >60)
Glucose, Bld: 108 mg/dL — ABNORMAL HIGH (ref 70–99)
Potassium: 3.2 mmol/L — ABNORMAL LOW (ref 3.5–5.1)
Sodium: 138 mmol/L (ref 135–145)

## 2023-01-16 LAB — RESP PANEL BY RT-PCR (RSV, FLU A&B, COVID)  RVPGX2
Influenza A by PCR: NEGATIVE
Influenza B by PCR: NEGATIVE
Resp Syncytial Virus by PCR: NEGATIVE
SARS Coronavirus 2 by RT PCR: NEGATIVE

## 2023-01-16 LAB — D-DIMER, QUANTITATIVE: D-Dimer, Quant: 0.86 ug{FEU}/mL — ABNORMAL HIGH (ref 0.00–0.50)

## 2023-01-16 MED ORDER — POTASSIUM CHLORIDE 20 MEQ PO PACK
40.0000 meq | PACK | Freq: Once | ORAL | Status: AC
  Administered 2023-01-16: 40 meq via ORAL
  Filled 2023-01-16: qty 2

## 2023-01-16 MED ORDER — IOHEXOL 350 MG/ML SOLN
75.0000 mL | Freq: Once | INTRAVENOUS | Status: AC | PRN
  Administered 2023-01-16: 75 mL via INTRAVENOUS

## 2023-01-16 NOTE — ED Notes (Signed)
Reviewed discharge instructions with pt. Pt states understanding

## 2023-01-16 NOTE — ED Triage Notes (Signed)
Patient presents to ED via POV from home. Here with cough and congestion x 3 days. Well appearing. Ambulatory.

## 2023-01-16 NOTE — ED Provider Notes (Signed)
[facility] AT [facility] Provider Note   CSN: [id number] Arrival date & time: 01/16/23  4098     History  Chief Complaint  Patient presents with   Cough    Jennifer Haas is a 47 y.o. female.   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  States for about 3 days she has had mild nonproductive cough as well as some nasal congestion.  She also feels like she may have some chest congestion.  No chest pain or pressure.  No pleuritic pain.  No shortness of breath.  No fevers or chills.  She is otherwise been at her baseline health.  She is on tamoxifen but has never required any chemotherapy or radiation.  No known sick exposures.  No fevers or chills.  She is otherwise asymptomatic.  She is no history of DVT or PE.  No leg swelling or recent travel or immobilization.     Home Medications Prior to Admission medications   Medication Sig Start Date End Date Taking? Authorizing Provider  aspirin 81 MG chewable tablet Chew 81 mg by mouth daily. 07/11/21   [provider]  atorvastatin (LIPITOR) 20 MG tablet Take 1 tablet by mouth daily. 07/11/21   [provider]  cyclobenzaprine (FLEXERIL) 10 MG tablet Take 0.5-1 tablets (5-10 mg total) by mouth 3 (three) times daily as needed. 11/18/22   Mort Sawyers, FNP  ergocalciferol (VITAMIN D2) 1.25 MG (50000 UT) capsule Take 50,000 Units by mouth once a week.    Alver Fisher, RN  ibuprofen (ADVIL) 800 MG tablet Take 800 mg by mouth every 8 (eight) hours as needed. 01/12/21   [provider]  lisinopril (ZESTRIL) 10 MG tablet Take 1 tablet (10 mg total) by mouth daily. (Resume as instructed by your outpatient provider): For hypertension 01/06/23   Mort Sawyers, FNP  tamoxifen (NOLVADEX) 10 MG tablet Take 0.5 tablets (5 mg total) by mouth daily. 06/30/22   Serena Croissant, MD  traZODone (DESYREL) 50 MG  tablet TAKE 0.5-1 TABLETS BY MOUTH AT BEDTIME AS NEEDED FOR SLEEP. 12/22/22   Mort Sawyers, FNP      Allergies    Patient has no known allergies.    Review of Systems   Review of Systems  Respiratory:  Positive for cough.   Review of systems completed and notable as per HPI.  ROS otherwise negative.   Physical Exam Updated Vital Signs BP (!) 166/90   Pulse 62   Temp 98.3 F (36.8 C)   Resp 16   Ht 5\' 5"  (1.651 m)   Wt 79.8 kg   SpO2 100%   BMI 29.29 kg/m  Physical Exam Vitals and nursing note reviewed.  Constitutional:      General: She is not in acute distress.    Appearance: She is well-developed.  HENT:     Head: Normocephalic and atraumatic.     Nose: Nose normal.     Mouth/Throat:     Mouth: Mucous membranes are moist.     Pharynx: Oropharynx is clear.  Eyes:     Extraocular Movements: Extraocular movements intact.     Conjunctiva/sclera: Conjunctivae normal.     Pupils: Pupils are equal, round, and reactive to light.  Cardiovascular:     Rate and Rhythm: Normal rate and regular rhythm.     Heart sounds: No murmur heard. Pulmonary:     Effort: Pulmonary effort is  normal. No respiratory distress.     Breath sounds: Normal breath sounds.  Abdominal:     Palpations: Abdomen is soft.     Tenderness: There is no abdominal tenderness. There is no guarding or rebound.  Musculoskeletal:        General: No swelling.     Cervical back: Neck supple.     Right lower leg: No edema.     Left lower leg: No edema.  Skin:    General: Skin is warm and dry.     Capillary Refill: Capillary refill takes less than 2 seconds.  Neurological:     Mental Status: She is alert.  Psychiatric:        Mood and Affect: Mood normal.     ED Results / Procedures / Treatments   Labs (all labs ordered are listed, but only abnormal results are displayed) Labs Reviewed  CBC - Abnormal; Notable for the following components:      Result Value   Hemoglobin 11.0 (*)    HCT 33.7 (*)     All other components within normal limits  BASIC METABOLIC PANEL - Abnormal; Notable for the following components:   Potassium 3.2 (*)    Glucose, Bld 108 (*)    Calcium 8.6 (*)    All other components within normal limits  D-DIMER, QUANTITATIVE - Abnormal; Notable for the following components:   D-Dimer, Quant 0.86 (*)    All other components within normal limits  RESP PANEL BY RT-PCR (RSV, FLU A&B, COVID)  RVPGX2    EKG None  Radiology CT Angio Chest PE W and/or Wo Contrast  Result Date: 01/16/2023 CLINICAL DATA:  Cough and congestion for 3 days EXAM: CT ANGIOGRAPHY CHEST WITH CONTRAST TECHNIQUE: Multidetector CT imaging of the chest was performed using the standard protocol during bolus administration of intravenous contrast. Multiplanar CT image reconstructions and MIPs were obtained to evaluate the vascular anatomy. RADIATION DOSE REDUCTION: This exam was performed according to the departmental dose-optimization program which includes automated exposure control, adjustment of the mA and/or kV according to patient size and/or use of iterative reconstruction technique. CONTRAST:  75mL OMNIPAQUE IOHEXOL 350 MG/ML SOLN COMPARISON:  CT chest angio dated July 22, 2017 FINDINGS: Cardiovascular: No evidence of pulmonary embolus. Normal heart size. No pericardial effusion. Normal caliber thoracic aorta with no atherosclerotic disease. No coronary artery calcifications. Mediastinum/Nodes: Esophagus and thyroid are unremarkable. No enlarged lymph nodes seen in the chest. Lungs/Pleura: Central airways are patent. No consolidation, pleural effusion or pneumothorax. Small solid nodule of the right middle lobe measuring 2 mm on series 301, image 62, unchanged when compared with the prior and considered benign. Upper Abdomen: Hepatic steatosis. Musculoskeletal: No chest wall abnormality. No acute or significant osseous findings. Review of the MIP images confirms the above findings. IMPRESSION: 1. No  evidence of pulmonary embolus or acute airspace opacity. 2. Hepatic steatosis. Electronically Signed   By: Allegra Lai M.D.   On: 01/16/2023 13:48   DG Chest 2 View  Result Date: 01/16/2023 CLINICAL DATA:  Cough EXAM: CHEST - 2 VIEW COMPARISON:  March 2019 FINDINGS: Hypoventilatory exam results in bronchovascular crowding in the lung bases. The cardiomediastinal silhouette is within normal limits. No pleural effusion. No pneumothorax. No mass or consolidation. No acute osseous abnormality. IMPRESSION: No acute findings in the chest. Electronically Signed   By: Olive Bass M.D.   On: 01/16/2023 11:03    Procedures Procedures    Medications Ordered in ED Medications  potassium chloride (KLOR-CON)  packet 40 mEq (40 mEq Oral Given 01/16/23 1341)  iohexol (OMNIPAQUE) 350 MG/ML injection 75 mL (75 mLs Intravenous Contrast Given 01/16/23 1242)    ED Course/ Medical Decision Making/ A&P                                 Medical Decision Making Amount and/or Complexity of Data Reviewed Labs: ordered. Radiology: ordered.  Risk Prescription drug management.   Medical Decision Making:   Jennifer Haas is a 47 y.o. female who presented to the ED today with cough, congestion.  Vital signs reviewed.  Patient is well-appearing, mildly hypertensive.  She is clear lungs bilaterally.  She reports several days of cough, congestion which are nonproductive.  I suspect she likely has a viral infection.  Obtain chest x-ray to evaluate for possible pneumonia.  I considered PE as well, although no hypoxia, DVT symptoms, tachycardia or other risk factors.  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  Will obtain x-ray, COVID testing and reassess.   Patient placed on continuous vitals and telemetry monitoring while in ED which was reviewed periodically.  Reviewed and confirmed nursing documentation for past medical history, family history, social history.  Reassessment and Plan:    Chest x-ray and COVID testing unremarkable.  Her D-dimer is slightly elevated and given her history CTA obtained without PE, pneumonia.  Lab work otherwise notable for mild anemia which I notified patient of, no active bleeding.  She is mild hypokalemia as well.  On reevaluation she is feeling better.  Suspect likely viral infection given cough and congestion.  Strict turn precautions given to recommend PCP follow-up.   Patient's presentation is most consistent with acute complicated illness / injury requiring diagnostic workup.           Final Clinical Impression(s) / ED Diagnoses Final diagnoses:  Acute cough    Rx / DC Orders ED Discharge Orders     None         Laurence Spates, MD 01/16/23 1536

## 2023-01-16 NOTE — Discharge Instructions (Signed)
Follow up with your PCP  Return for new or worsening symptoms

## 2023-01-19 NOTE — Telephone Encounter (Signed)
Spoke with pt. She has been scheduled to see Tabitha on 01/28/2023 at 0820. Nothing further was needed.

## 2023-01-19 NOTE — Telephone Encounter (Signed)
Please schedule pt for ER f/u we can discuss levels more in detail.   Make appt for next week so that there is enough time before repeat of labs unless pt not feeling any better.

## 2023-01-28 ENCOUNTER — Encounter: Payer: Self-pay | Admitting: Family

## 2023-01-28 ENCOUNTER — Encounter: Payer: Self-pay | Admitting: Hematology and Oncology

## 2023-01-28 ENCOUNTER — Ambulatory Visit: Payer: BC Managed Care – PPO | Admitting: Family

## 2023-01-28 VITALS — BP 114/68 | HR 64 | Temp 97.1°F | Ht 65.0 in | Wt 177.6 lb

## 2023-01-28 DIAGNOSIS — R7303 Prediabetes: Secondary | ICD-10-CM | POA: Diagnosis not present

## 2023-01-28 DIAGNOSIS — E876 Hypokalemia: Secondary | ICD-10-CM | POA: Diagnosis not present

## 2023-01-28 DIAGNOSIS — R911 Solitary pulmonary nodule: Secondary | ICD-10-CM | POA: Diagnosis not present

## 2023-01-28 DIAGNOSIS — D649 Anemia, unspecified: Secondary | ICD-10-CM | POA: Diagnosis not present

## 2023-01-28 DIAGNOSIS — R7989 Other specified abnormal findings of blood chemistry: Secondary | ICD-10-CM | POA: Insufficient documentation

## 2023-01-28 DIAGNOSIS — I10 Essential (primary) hypertension: Secondary | ICD-10-CM

## 2023-01-28 DIAGNOSIS — D0502 Lobular carcinoma in situ of left breast: Secondary | ICD-10-CM

## 2023-01-28 LAB — BASIC METABOLIC PANEL
BUN: 12 mg/dL (ref 6–23)
CO2: 27 mEq/L (ref 19–32)
Calcium: 9 mg/dL (ref 8.4–10.5)
Chloride: 105 mEq/L (ref 96–112)
Creatinine, Ser: 0.56 mg/dL (ref 0.40–1.20)
GFR: 108.24 mL/min (ref >60.00)
Glucose, Bld: 107 mg/dL — ABNORMAL HIGH (ref 70–99)
Potassium: 4 mEq/L (ref 3.5–5.1)
Sodium: 140 mEq/L (ref 135–145)

## 2023-01-28 LAB — IBC + FERRITIN
Ferritin: 12.9 ng/mL (ref 10.0–291.0)
Iron: 84 ug/dL (ref 42–145)
Saturation Ratios: 15.4 % — ABNORMAL LOW (ref 20.0–50.0)
TIBC: 546 ug/dL — ABNORMAL HIGH (ref 250.0–450.0)
Transferrin: 390 mg/dL — ABNORMAL HIGH (ref 212.0–360.0)

## 2023-01-28 LAB — CBC
HCT: 38.2 % (ref 36.0–46.0)
Hemoglobin: 12.2 g/dL (ref 12.0–15.0)
MCHC: 32.1 g/dL (ref 30.0–36.0)
MCV: 85.9 fl (ref 78.0–100.0)
Platelets: 280 10*3/uL (ref 150.0–400.0)
RBC: 4.45 Mil/uL (ref 3.87–5.11)
RDW: 14.1 % (ref 11.5–15.5)
WBC: 4.5 10*3/uL (ref 4.0–10.5)

## 2023-01-28 LAB — HEMOGLOBIN A1C: Hgb A1c MFr Bld: 5.8 % (ref 4.6–6.5)

## 2023-01-28 LAB — POTASSIUM: Potassium: 4 mEq/L (ref 3.5–5.1)

## 2023-01-28 NOTE — Assessment & Plan Note (Signed)
Reviewed CTA suspected benign

## 2023-01-28 NOTE — Assessment & Plan Note (Signed)
Pt advised of the following: Work on a diabetic diet, try to incorporate exercise at least 20-30 a day for 3 days a week or more.

## 2023-01-28 NOTE — Assessment & Plan Note (Addendum)
Asymptomatic and low suspicion DVT  CTA and chest xray reviewed from ER, no evidence of PE Repeating d dimer today pending results  If positive will consider left lower leg venous to r/o dvt

## 2023-01-28 NOTE — Assessment & Plan Note (Signed)
Improved.  Continue losartan 100 mg once daily

## 2023-01-28 NOTE — Progress Notes (Signed)
Established Patient Office Visit  Subjective:      CC:  Chief Complaint  Patient presents with   Medical Management of Chronic Issues    ER follow up - 01/16/2023    HPI: Jennifer Haas is a 47 y.o. female presenting on 01/28/2023 for Medical Management of Chronic Issues (ER follow up - 01/16/2023) . Pt went to [facility] ER 01/16/23 for mild nonproductive cough with some nasal congestion. BP was a bit elevated at 166/90  CXR -unremarkable Covid testing negative Mildly hypokalemic and mildly anemic, no active bleeding.  Suspected viral infection.   New complaints: She is here with concerns because when she was looking over her lab results she was worried about the elevated D dimer. She is especially worried because she started on tamoxifen in feb and she was told this can increase risk for DVT. She states when in the ER her left lower leg was 'tingling' and her left lower leg was swollen slightly, right was swollen a bit but left greater than right. No longer with edema. She is always tired but states no increased fatigue as from her normal. Pt states she has stopped tamoxifen after ER visit because she was worried about the risk. She wants to await repeat results prior to restarting.   No sob cp and cough has improved if not completely resolved.    HTN: losartan increased to 100 mg once daily. Today in office 114/68.    Social history:  Relevant past medical, surgical, family and social history reviewed and updated as indicated. Interim medical history since our last visit reviewed.  Allergies and medications reviewed and updated.  DATA REVIEWED: CHART IN EPIC     ROS: Negative unless specifically indicated above in HPI.    Current Outpatient Medications:    aspirin 81 MG chewable tablet, Chew 81 mg by mouth daily., Disp: , Rfl:    atorvastatin (LIPITOR) 20 MG tablet, Take 1 tablet by mouth daily., Disp: , Rfl:    cyclobenzaprine (FLEXERIL) 10 MG  tablet, Take 0.5-1 tablets (5-10 mg total) by mouth 3 (three) times daily as needed., Disp: 30 tablet, Rfl: 0   ergocalciferol (VITAMIN D2) 1.25 MG (50000 UT) capsule, Take 50,000 Units by mouth once a week., Disp: , Rfl:    ibuprofen (ADVIL) 800 MG tablet, Take 800 mg by mouth every 8 (eight) hours as needed., Disp: , Rfl:    traZODone (DESYREL) 50 MG tablet, TAKE 0.5-1 TABLETS BY MOUTH AT BEDTIME AS NEEDED FOR SLEEP., Disp: 90 tablet, Rfl: 1   lisinopril (ZESTRIL) 10 MG tablet, Take 1 tablet (10 mg total) by mouth daily. (Resume as instructed by your outpatient provider): For hypertension, Disp: 90 tablet, Rfl: 3   tamoxifen (NOLVADEX) 10 MG tablet, Take 0.5 tablets (5 mg total) by mouth daily. (Patient not taking: Reported on 01/28/2023), Disp: 90 tablet, Rfl: 1      Objective:    BP 114/68 (BP Location: Left Arm, Patient Position: Sitting, Cuff Size: Normal)   Pulse 64   Temp (!) 97.1 F (36.2 C) (Temporal)   Ht 5\' 5"  (1.651 m)   Wt 177 lb 9.6 oz (80.6 kg)   SpO2 99%   BMI 29.55 kg/m   Wt Readings from Last 3 Encounters:  01/28/23 177 lb 9.6 oz (80.6 kg)  01/16/23 176 lb (79.8 kg)  11/18/22 172 lb (78 kg)    Physical Exam Constitutional:      General: She is not in acute distress.  Appearance: Normal appearance. She is normal weight. She is not ill-appearing, toxic-appearing or diaphoretic.  HENT:     Head: Normocephalic.  Cardiovascular:     Rate and Rhythm: Normal rate and regular rhythm.     Comments: Negative homans left lower leg Pulmonary:     Effort: Pulmonary effort is normal.     Breath sounds: Normal breath sounds.  Musculoskeletal:        General: Normal range of motion.     Right lower leg: No edema.     Left lower leg: No edema.  Neurological:     General: No focal deficit present.     Mental Status: She is alert and oriented to person, place, and time. Mental status is at baseline.  Psychiatric:        Mood and Affect: Mood normal.        Behavior:  Behavior normal.        Thought Content: Thought content normal.        Judgment: Judgment normal.           Assessment & Plan:  Prediabetes Assessment & Plan: Pt advised of the following: Work on a diabetic diet, try to incorporate exercise at least 20-30 a day for 3 days a week or more.    Orders: -     Hemoglobin A1c  Nodule of middle lobe of right lung Assessment & Plan: Reviewed CTA suspected benign    Hypokalemia -     Potassium -     Basic metabolic panel  Positive D dimer Assessment & Plan: Asymptomatic and low suspicion DVT  CTA and chest xray reviewed from ER, no evidence of PE Repeating d dimer today pending results  If positive will consider left lower leg venous to r/o dvt  Orders: -     D-dimer, quantitative  Anemia, unspecified type -     CBC -     IBC + Ferritin  Primary hypertension Assessment & Plan: Improved.  Continue losartan 100 mg once daily    Lobular carcinoma in situ (LCIS) of left breast Assessment & Plan: Currently stopped tamoxifen, she states she will consider restart upon lab results.  Advised pt if she does choose to stop let oncology know as the risks outweigh benefits due to high risk       Return for f/u around 05/2023 for CPE.  Mort Sawyers, MSN, APRN, FNP-C [facility]

## 2023-01-28 NOTE — Assessment & Plan Note (Signed)
Currently stopped tamoxifen, she states she will consider restart upon lab results.  Advised pt if she does choose to stop let oncology know as the risks outweigh benefits due to high risk

## 2023-01-29 ENCOUNTER — Other Ambulatory Visit: Payer: Self-pay | Admitting: Family

## 2023-01-29 ENCOUNTER — Encounter: Payer: Self-pay | Admitting: Family

## 2023-01-29 ENCOUNTER — Telehealth: Payer: Self-pay | Admitting: Family

## 2023-01-29 ENCOUNTER — Ambulatory Visit
Admission: RE | Admit: 2023-01-29 | Discharge: 2023-01-29 | Disposition: A | Discharge status: Home / Self Care | Payer: BC Managed Care – PPO | Source: Ambulatory Visit | Attending: Family | Admitting: Family

## 2023-01-29 ENCOUNTER — Ambulatory Visit: Payer: BC Managed Care – PPO | Admitting: Hematology and Oncology

## 2023-01-29 ENCOUNTER — Telehealth: Payer: Self-pay | Admitting: Internal Medicine

## 2023-01-29 ENCOUNTER — Telehealth: Payer: Self-pay

## 2023-01-29 DIAGNOSIS — M79605 Pain in left leg: Secondary | ICD-10-CM | POA: Insufficient documentation

## 2023-01-29 DIAGNOSIS — R600 Localized edema: Secondary | ICD-10-CM | POA: Insufficient documentation

## 2023-01-29 DIAGNOSIS — M7989 Other specified soft tissue disorders: Secondary | ICD-10-CM | POA: Diagnosis not present

## 2023-01-29 DIAGNOSIS — I10 Essential (primary) hypertension: Secondary | ICD-10-CM

## 2023-01-29 DIAGNOSIS — R7989 Other specified abnormal findings of blood chemistry: Secondary | ICD-10-CM

## 2023-01-29 LAB — D-DIMER, QUANTITATIVE: D-Dimer, Quant: 1.06 mcg/mL FEU — ABNORMAL HIGH (ref <0.50)

## 2023-01-29 NOTE — Telephone Encounter (Signed)
Noted  

## 2023-01-29 NOTE — Telephone Encounter (Signed)
Received call this am - critical lab value - D dimer elevated 1.06. reviewed chart.  Pt in ER recently.  D dimer elevated.  CTA (chest) - negative for PE.  Reviewed office note.  No acute symptoms.  Called pt and discussed lab result.  She denies any chest pain, sob or increased leg swelling.  No acute symptoms.  She does report that she has noticed numbness/tingling in her feer/leg/fingers (not new).  Discussed D dimer elevation and possible causes.  Explained I would forward information to her PCP for further evaluation and f/u.  Questions answered.

## 2023-01-29 NOTE — Progress Notes (Signed)
Already addressed in another message as well.

## 2023-01-29 NOTE — Telephone Encounter (Signed)
Jennifer Haas with [facility] [place] called report [place] bilateral lower extremities.; no evidence of deep venous thrombosis in either lower extremity. Report is in epic and pt is not waiting.Sending note to Hayden Pedro FNP.

## 2023-01-29 NOTE — Telephone Encounter (Addendum)
Per this my chart note T Dugal FNP is aware of this called report on d dimer. Please also see phone note 01/29/23. Sending note to Hayden Pedro FNP and will teams [place] CMA.

## 2023-01-29 NOTE — Telephone Encounter (Signed)
Spoke with Dr. Lorin Picket this am in regards to positive D dimer.  Called pt, despite the reports of left lower ext tingling and bil edema last week no new symptoms. Advised pt we are ordering stat bil lower ext u/s to r/o DVT. Also spoke with Dr. Pamelia Hoit pt's oncologist, who stated tamoxifen can increase risk of DVT so are moving forward with U/S gave pt strict precautions, if any sudden or worsening doe or sob, chest pain, and or stroke like symptoms call 911.

## 2023-01-29 NOTE — Telephone Encounter (Signed)
Patient called in and stated that she was trying to get her [place] scheduled for today. She stated that the order is incomplete and stated that they said it was saying both legs and not her Left leg. She need this to be updated so she can get it scheduled today. Please advise. Thank you!

## 2023-01-29 NOTE — Telephone Encounter (Signed)
This issue was handled earlier this morning by [place].

## 2023-01-29 NOTE — Telephone Encounter (Signed)
Thank you. Noted.

## 2023-01-29 NOTE — Telephone Encounter (Signed)
Just an FYI bil LE ext u/s neg for DVT

## 2023-02-02 MED ORDER — LISINOPRIL 10 MG PO TABS
10.0000 mg | ORAL_TABLET | Freq: Every day | ORAL | 3 refills | Status: DC
Start: 2023-02-02 — End: 2024-03-09

## 2023-02-03 ENCOUNTER — Encounter: Payer: Self-pay | Admitting: Family

## 2023-02-04 DIAGNOSIS — Z6829 Body mass index (BMI) 29.0-29.9, adult: Secondary | ICD-10-CM | POA: Diagnosis not present

## 2023-02-04 DIAGNOSIS — Z01419 Encounter for gynecological examination (general) (routine) without abnormal findings: Secondary | ICD-10-CM | POA: Diagnosis not present

## 2023-02-04 DIAGNOSIS — R319 Hematuria, unspecified: Secondary | ICD-10-CM | POA: Diagnosis not present

## 2023-02-06 ENCOUNTER — Inpatient Hospital Stay: Payer: BC Managed Care – PPO | Attending: Adult Health | Admitting: Adult Health

## 2023-02-06 DIAGNOSIS — D0502 Lobular carcinoma in situ of left breast: Secondary | ICD-10-CM

## 2023-02-06 NOTE — Progress Notes (Unsigned)
[facility] Cancer Follow up:    Mort Sawyers, FNP [address] [place] [address]  I connected with Jennifer Haas on 02/09/23 at 12:15 PM EDT by telephone and verified that I am speaking with the correct person using two identifiers.  I discussed the limitations, risks, security and privacy concerns of performing an evaluation and management service by telephone and the availability of in person appointments.  I also discussed with the patient that there may be a patient responsible charge related to this service. The patient expressed understanding and agreed to proceed.   DIAGNOSIS: At high risk for breast cancer, LCIS  SUMMARY OF HEMATOLOGIC HISTORY: 04/24/2022: Left lumpectomy: Benign intraductal and sclerosing papilloma. Focal LCIS and ALH, margins negative    Current treatment:  Risk reduction of tamoxifen started 06/30/2022 (5 mg dose) Intensified screening: Mammogram alternating with breast MRI.    CURRENT THERAPY: Tamoxifen daily  INTERVAL HISTORY: Jennifer Haas 47 y.o. female returns to discuss Tamoxifen.  She says initially it was going ok, then a couple of weeks ago she felt a "feeling she cannot put into words" accompanied by a cough.  She went to the ER on 01/16/2023 and a d-dimer level was elevated.  She underwent a CT angiogram of the chest at the ER that was negative for pulmonary embolus along with a bilateral lower extremity Doppler that was negative for DVT in either lower extremity.  She has been off Tamoxifen since then, she says she is feeling somewhat better.     Patient Active Problem List   Diagnosis Date Noted   Nodule of middle lobe of right lung 01/28/2023   Anemia 01/28/2023   Positive D dimer 01/28/2023   Breast neoplasm, Tis (LCIS), left 11/18/2022   Sleep disorder 11/18/2022   Lobular carcinoma in situ (LCIS) of left breast 06/30/2022   History of TIA (transient ischemic attack) 04/04/2022   Major depressive  disorder, single episode, in remission (HCC) 04/04/2022   Uterine leiomyoma 04/04/2022   History of suicide attempt 04/04/2022   Palpitations 04/04/2022   Prediabetes 04/04/2022   Atrial septal aneurysm 09/19/2013   Premature atrial contractions 09/19/2013   HTN (hypertension) 09/19/2013    has No Known Allergies.  MEDICAL HISTORY: Past Medical History:  Diagnosis Date   Heart murmur    Hypertension    on meds   PVC's (premature ventricular contractions)    Suicide attempt (HCC)    06/2018   TIA (transient ischemic attack) 06/2021    SURGICAL HISTORY: Past Surgical History:  Procedure Laterality Date   ABDOMINAL CERCLAGE  05/29/2011   Procedure: CERCLAGE ABDOMINAL;  Surgeon: Juluis Mire, MD;  Location: [facility];  Service: Gynecology;  Laterality: N/A;  need to use mersilene band   ABDOMINAL CERCLAGE  10/20/2011   Procedure: CERCLAGE ABDOMINAL;  Surgeon: Jeani Hawking, MD;  Location: [facility];  Service: Gynecology;  Laterality: N/A;  Removal of abdominal cerclage   BREAST BIOPSY  04/23/2022   MM LT RADIOACTIVE SEED LOC MAMMO GUIDE 04/23/2022 [facility] MAMMOGRAPHY   BREAST EXCISIONAL BIOPSY  2020   BREAST LUMPECTOMY WITH RADIOACTIVE SEED LOCALIZATION Left 11/04/2018   Procedure: LEFT BREAST LUMPECTOMY WITH RADIOACTIVE SEED GUIDED LOCALIZATION;  Surgeon: Abigail Miyamoto, MD;  Location: [facility];  Service: General;  Laterality: Left;   BREAST LUMPECTOMY WITH RADIOACTIVE SEED LOCALIZATION Left 04/24/2022   Procedure: LEFT BREAST LUMPECTOMY WITH RADIOACTIVE SEED LOCALIZATION;  Surgeon: Abigail Miyamoto, MD;  Location: [facility];  Service: General;  Laterality: Left;  LMA   CESAREAN SECTION  2013   DILATION AND CURETTAGE OF UTERUS  1997   LEEP     TUBAL LIGATION  2013   WISDOM TOOTH EXTRACTION  2006    SOCIAL HISTORY: Social History   Socioeconomic History   Marital status: Married    Spouse name: Not on file   Number of children: 4    Years of education: Not on file   Highest education level: Associate degree: academic program  Occupational History   Not on file  Tobacco Use   Smoking status: Never   Smokeless tobacco: Never  Vaping Use   Vaping status: Never Used  Substance and Sexual Activity   Alcohol use: Yes    Comment: 1 drink per month   Drug use: No   Sexual activity: Yes    Partners: Male    Birth control/protection: Surgical  Other Topics Concern   Not on file  Social History Narrative   Not on file   Social Determinants of Health   Financial Resource Strain: Low Risk  (07/11/2021)   Received from [facility], [facility]   Overall Financial Resource Strain (CARDIA)    Difficulty of Paying Living Expenses: Not very hard  Food Insecurity: No Food Insecurity (07/11/2021)   Received from [facility], [facility]   Hunger Vital Sign    Worried About Running Out of Food in the Last Year: Never true    Ran Out of Food in the Last Year: Never true  Transportation Needs: No Transportation Needs (07/11/2021)   Received from [facility], [facility]   PRAPARE - Transportation    Lack of Transportation (Medical): No    Lack of Transportation (Non-Medical): No  Physical Activity: Insufficiently Active (07/11/2021)   Received from [facility], [facility]   Exercise Vital Sign    Days of Exercise per Week: 3 days    Minutes of Exercise per Session: 40 min  Stress: Stress Concern Present (07/11/2021)   Received from [facility], [facility] of Occupational Health - Occupational Stress Questionnaire    Feeling of Stress : Rather much  Social Connections: Unknown (09/13/2021)   Received from [facility], [facility]   Social Network    Social Network: Not on file  Intimate Partner Violence: Unknown (08/05/2021)   Received from [facility], [facility]   HITS    Physically Hurt: Not on file    Insult or Talk Down To: Not on file    Threaten Physical Harm: Not on  file    Scream or Curse: Not on file    FAMILY HISTORY: Family History  Problem Relation Age of Onset   Diabetes Mother    Diabetes Father    Heart disease Father    Hypertension Father    Alzheimer's disease Maternal Grandmother    Heart attack Maternal Grandfather    Stroke Paternal Grandmother    Breast cancer Neg Hx    Colon polyps Neg Hx    Colon cancer Neg Hx    Esophageal cancer Neg Hx    Stomach cancer Neg Hx    Rectal cancer Neg Hx     Review of Systems  Constitutional:  Negative for appetite change, chills, fatigue, fever and unexpected weight change.  HENT:   Negative for hearing loss, lump/mass and trouble swallowing.   Eyes:  Negative for eye problems and icterus.  Respiratory:  Negative for chest tightness,  cough and shortness of breath.   Cardiovascular:  Negative for chest pain, leg swelling and palpitations.  Gastrointestinal:  Negative for abdominal distention, abdominal pain, constipation, diarrhea, nausea and vomiting.  Endocrine: Negative for hot flashes.  Genitourinary:  Negative for difficulty urinating.   Musculoskeletal:  Negative for arthralgias.  Skin:  Negative for itching and rash.  Neurological:  Negative for dizziness, extremity weakness, headaches and numbness.  Hematological:  Negative for adenopathy. Does not bruise/bleed easily.  Psychiatric/Behavioral:  Negative for depression. The patient is not nervous/anxious.       PHYSICAL EXAMINATION  Patient sounds well.  She is in no apparent distress.  Mood and behavior are normal.   ASSESSMENT and THERAPY PLAN:   Lobular carcinoma in situ (LCIS) of left breast 04/24/2022: Left lumpectomy: Benign intraductal and sclerosing papilloma. Focal LCIS and ALH, margins negative   Current treatment: Risk reduction of tamoxifen started 06/30/2022 (5 mg dose)  Tamoxifen toxicities: ? Side effects leading to ED visit in September.  I recommended that Terilynn stay off the Tamoxifen for the time being  considering her d-dimer elevation, and h/o TIA and HTN.    I recommended continued intensified screening with mammogram (ordered for 06/2022) alternating with breast MRI (due in 12/2022).    She will f/u with Dr. Pamelia Hoit in 3 months to see how she is doing and discuss further.     The patient was provided an opportunity to ask questions and all were answered. The patient agreed with the plan and demonstrated an understanding of the instructions.   The patient was advised to call back or seek an in-person evaluation if the symptoms worsen or if the condition fails to improve as anticipated.   I provided 15 minutes of non face-to-face telephone visit time during this encounter, and > 50% was spent counseling as documented under my assessment & plan.   Lillard Anes, NP 02/09/23 2:05 PM Medical Oncology and Hematology [facility] [address] [place], [place] [address] Tel. [phone]    Fax. [phone]  *Total Encounter Time as defined by the Centers for Medicare and Medicaid Services includes, in addition to the face-to-face time of a patient visit (documented in the note above) non-face-to-face time: obtaining and reviewing outside history, ordering and reviewing medications, tests or procedures, care coordination (communications with other health care professionals or caregivers) and documentation in the medical record.

## 2023-02-09 ENCOUNTER — Encounter: Payer: Self-pay | Admitting: Adult Health

## 2023-02-09 NOTE — Assessment & Plan Note (Signed)
04/24/2022: Left lumpectomy: Benign intraductal and sclerosing papilloma. Focal LCIS and ALH, margins negative   Current treatment: Risk reduction of tamoxifen started 06/30/2022 (5 mg dose)  Tamoxifen toxicities: ? Side effects leading to ED visit in September.  I recommended that Jennifer Haas stay off the Tamoxifen for the time being considering her d-dimer elevation, and h/o TIA and HTN.    I recommended continued intensified screening with mammogram (ordered for 06/2022) alternating with breast MRI (due in 12/2022).    She will f/u with Dr. Pamelia Hoit in 3 months to see how she is doing and discuss further.

## 2023-03-11 ENCOUNTER — Ambulatory Visit: Payer: BC Managed Care – PPO | Admitting: Family

## 2023-03-12 ENCOUNTER — Ambulatory Visit: Payer: BC Managed Care – PPO | Admitting: Family

## 2023-03-12 ENCOUNTER — Encounter: Payer: Self-pay | Admitting: Family

## 2023-03-12 VITALS — BP 108/68 | HR 78 | Temp 98.7°F | Ht 65.0 in | Wt 178.4 lb

## 2023-03-12 DIAGNOSIS — R350 Frequency of micturition: Secondary | ICD-10-CM

## 2023-03-12 DIAGNOSIS — R7989 Other specified abnormal findings of blood chemistry: Secondary | ICD-10-CM

## 2023-03-12 DIAGNOSIS — R311 Benign essential microscopic hematuria: Secondary | ICD-10-CM

## 2023-03-12 DIAGNOSIS — E7800 Pure hypercholesterolemia, unspecified: Secondary | ICD-10-CM | POA: Insufficient documentation

## 2023-03-12 DIAGNOSIS — N3001 Acute cystitis with hematuria: Secondary | ICD-10-CM

## 2023-03-12 DIAGNOSIS — E559 Vitamin D deficiency, unspecified: Secondary | ICD-10-CM

## 2023-03-12 LAB — POCT URINE DIPSTICK
Bilirubin, UA: NEGATIVE
Glucose, UA: NEGATIVE mg/dL
Ketones, POC UA: NEGATIVE mg/dL
Leukocytes, UA: NEGATIVE
Nitrite, UA: NEGATIVE
POC PROTEIN,UA: 30 — AB
Spec Grav, UA: 1.025 (ref 1.010–1.025)
Urobilinogen, UA: 0.2 U/dL
pH, UA: 5.5 (ref 5.0–8.0)

## 2023-03-12 MED ORDER — SULFAMETHOXAZOLE-TRIMETHOPRIM 800-160 MG PO TABS: 1.0000 | ORAL_TABLET | Freq: Two times a day (BID) | ORAL | 0 refills | Status: AC

## 2023-03-12 NOTE — Progress Notes (Signed)
Established Patient Office Visit  Subjective:   Patient ID: Jennifer Haas, female    DOB: 09-27-1975  Age: 47 y.o. MRN: [id number]  CC: No chief complaint on file.   HPI: Jennifer Haas is a 47 y.o. female presenting on 03/12/2023 for No chief complaint on file.   Five days ago started with urinary frequency, going about every five minutes. Last night eased up slightly however not completely. With no dysuria, slight lower abdominal pain and no back pain. No fever or chills.  No blood seen in urine.   No vaginal discharge.   Still with menses, back to normal since stopping the tamoxifen.     ROS: Negative unless specifically indicated above in HPI.   Relevant past medical history reviewed and updated as indicated.   Allergies and medications reviewed and updated.   Current Outpatient Medications:    aspirin 81 MG chewable tablet, Chew 81 mg by mouth daily., Disp: , Rfl:    atorvastatin (LIPITOR) 20 MG tablet, Take 1 tablet by mouth daily., Disp: , Rfl:    cyclobenzaprine (FLEXERIL) 10 MG tablet, Take 0.5-1 tablets (5-10 mg total) by mouth 3 (three) times daily as needed., Disp: 30 tablet, Rfl: 0   ergocalciferol (VITAMIN D2) 1.25 MG (50000 UT) capsule, Take 50,000 Units by mouth once a week., Disp: , Rfl:    ibuprofen (ADVIL) 800 MG tablet, Take 800 mg by mouth every 8 (eight) hours as needed., Disp: , Rfl:    lisinopril (ZESTRIL) 10 MG tablet, Take 1 tablet (10 mg total) by mouth daily. (Resume as instructed by your outpatient provider): For hypertension, Disp: 90 tablet, Rfl: 3   sulfamethoxazole-trimethoprim (BACTRIM DS) 800-160 MG tablet, Take 1 tablet by mouth 2 (two) times daily for 7 days., Disp: 14 tablet, Rfl: 0   traZODone (DESYREL) 50 MG tablet, TAKE 0.5-1 TABLETS BY MOUTH AT BEDTIME AS NEEDED FOR SLEEP., Disp: 90 tablet, Rfl: 1   nitrofurantoin, macrocrystal-monohydrate, (MACROBID) 100 MG capsule, Take 1 capsule (100 mg total) by mouth 2 (two) times  daily for 5 days., Disp: 10 capsule, Rfl: 0  Allergies  Allergen Reactions   Bactrim [Sulfamethoxazole-Trimethoprim] Itching   Tamoxifen Other (See Comments)    Tingling lower legs     Objective:   BP 108/68 (BP Location: Left Arm, Patient Position: Sitting, Cuff Size: Normal)   Pulse 78   Temp 98.7 F (37.1 C) (Temporal)   Ht 5\' 5"  (1.651 m)   Wt 178 lb 6.4 oz (80.9 kg)   SpO2 97%   BMI 29.69 kg/m    Physical Exam Constitutional:      General: She is not in acute distress.    Appearance: Normal appearance. She is normal weight. She is not ill-appearing, toxic-appearing or diaphoretic.  Cardiovascular:     Rate and Rhythm: Normal rate.  Pulmonary:     Effort: Pulmonary effort is normal.  Abdominal:     General: Abdomen is flat.     Tenderness: There is no abdominal tenderness. There is no right CVA tenderness or left CVA tenderness.  Neurological:     General: No focal deficit present.     Mental Status: She is alert and oriented to person, place, and time. Mental status is at baseline.     Motor: No weakness.  Psychiatric:        Mood and Affect: Mood normal.        Behavior: Behavior normal.        Thought Content: Thought content  normal.        Judgment: Judgment normal.     Assessment & Plan:  Urinary frequency -     POCT URINE DIPSTICK  Benign essential microscopic hematuria -     Urinalysis w microscopic + reflex cultur; Future  Acute cystitis with hematuria Assessment & Plan: poct urine dip in office Urine culture ordered pending results antbx sent to pharmacy, pt to take as directed. Encouraged increased water intake throughout the day. Choosing to treat due to being symptomatic. If no improvement in the next 2 days pt advised to let me know. Rx bactrim 800/160 mg po bid x 7 days   Orders: -     Sulfamethoxazole-Trimethoprim; Take 1 tablet by mouth 2 (two) times daily for 7 days.  Dispense: 14 tablet; Refill: 0  Elevated LDL cholesterol  level Assessment & Plan: Ordered lipid panel, pending results. Work on low cholesterol diet and exercise as tolerated   Orders: -     Lipid panel; Future  Positive D dimer Assessment & Plan: Pt would like to have repeated, ordered d dimer pending results.  Low suspicion for red flag dx due to currently being asymptomatic   Orders: -     D-dimer, quantitative; Future  Vitamin D deficiency Assessment & Plan: Ordered vitamin d pending results.    Orders: -     VITAMIN D 25 Hydroxy (Vit-D Deficiency, Fractures); Future  Other orders -     REFLEXIVE URINE CULTURE     Follow up plan: Return in about 6 months (around 09/09/2023) for f/u CPE.  Mort Sawyers, FNP

## 2023-03-13 LAB — URINALYSIS W MICROSCOPIC + REFLEX CULTURE
Bilirubin Urine: NEGATIVE
Glucose, UA: NEGATIVE
Hgb urine dipstick: NEGATIVE
Hyaline Cast: NONE SEEN /[LPF]
Ketones, ur: NEGATIVE
Leukocyte Esterase: NEGATIVE
Nitrites, Initial: NEGATIVE
Specific Gravity, Urine: 1.036 — ABNORMAL HIGH (ref 1.001–1.035)
WBC, UA: NONE SEEN /[HPF] (ref 0–5)
pH: 5.5 (ref 5.0–8.0)

## 2023-03-13 LAB — NO CULTURE INDICATED

## 2023-03-13 MED ORDER — NITROFURANTOIN MONOHYD MACRO 100 MG PO CAPS: 100.0000 mg | ORAL_CAPSULE | Freq: Two times a day (BID) | ORAL | 0 refills | Status: AC

## 2023-03-16 DIAGNOSIS — E559 Vitamin D deficiency, unspecified: Secondary | ICD-10-CM | POA: Insufficient documentation

## 2023-03-16 NOTE — Assessment & Plan Note (Signed)
poct urine dip in office Urine culture ordered pending results antbx sent to pharmacy, pt to take as directed. Encouraged increased water intake throughout the day. Choosing to treat due to being symptomatic. If no improvement in the next 2 days pt advised to let me know.  Rx bactrim 800/160 mg po bid x 7 days.

## 2023-03-16 NOTE — Assessment & Plan Note (Signed)
Ordered lipid panel, pending results. Work on low cholesterol diet and exercise as tolerated  

## 2023-03-16 NOTE — Assessment & Plan Note (Signed)
Pt would like to have repeated, ordered d dimer pending results.  Low suspicion for red flag dx due to currently being asymptomatic

## 2023-03-16 NOTE — Assessment & Plan Note (Signed)
Ordered vitamin d pending results.   

## 2023-03-26 ENCOUNTER — Encounter: Payer: Self-pay | Admitting: Hematology and Oncology

## 2023-04-15 ENCOUNTER — Other Ambulatory Visit (INDEPENDENT_AMBULATORY_CARE_PROVIDER_SITE_OTHER): Payer: BC Managed Care – PPO

## 2023-04-15 ENCOUNTER — Encounter: Payer: Self-pay | Admitting: Family

## 2023-04-15 DIAGNOSIS — E7800 Pure hypercholesterolemia, unspecified: Secondary | ICD-10-CM

## 2023-04-15 DIAGNOSIS — E559 Vitamin D deficiency, unspecified: Secondary | ICD-10-CM | POA: Diagnosis not present

## 2023-04-15 DIAGNOSIS — R7989 Other specified abnormal findings of blood chemistry: Secondary | ICD-10-CM

## 2023-04-15 LAB — LIPID PANEL
Cholesterol: 171 mg/dL (ref 0–200)
HDL: 39.6 mg/dL (ref >39.00)
LDL Cholesterol: 115 mg/dL — ABNORMAL HIGH (ref 0–99)
NonHDL: 131.51
Total CHOL/HDL Ratio: 4
Triglycerides: 84 mg/dL (ref 0.0–149.0)
VLDL: 16.8 mg/dL (ref 0.0–40.0)

## 2023-04-15 LAB — VITAMIN D 25 HYDROXY (VIT D DEFICIENCY, FRACTURES): VITD: 39.96 ng/mL (ref 30.00–100.00)

## 2023-04-16 ENCOUNTER — Telehealth: Payer: Self-pay | Admitting: Internal Medicine

## 2023-04-16 LAB — D-DIMER, QUANTITATIVE: D-Dimer, Quant: 0.81 ug{FEU}/mL — ABNORMAL HIGH (ref <0.50)

## 2023-04-16 MED ORDER — ATORVASTATIN CALCIUM 10 MG PO TABS
10.0000 mg | ORAL_TABLET | Freq: Every day | ORAL | 3 refills | Status: DC
Start: 2023-04-16 — End: 2024-03-25

## 2023-04-16 NOTE — Telephone Encounter (Signed)
Please see below access nurse note. And Hayden Pedro FNP is aware. Sending note to Hayden Pedro FNP.

## 2023-04-16 NOTE — Telephone Encounter (Signed)
Noted  

## 2023-04-16 NOTE — Telephone Encounter (Signed)
Received call - critical lab value.  D dimer .81. Reviewed labs.  D dimer - last check - 01/28/23 - 1.06. called pt to see if any acute symptoms or change in symptoms.  She reports no new symptoms. Specifically denies any chest pain, sob or leg swelling. States she is feeling better. Discussed that I would forward information to you.

## 2023-04-16 NOTE — Addendum Note (Signed)
Addended by: Mort Sawyers on: 04/16/2023 03:40 PM   Modules accepted: Orders

## 2023-04-16 NOTE — Telephone Encounter (Signed)
Thank you :)

## 2023-05-15 ENCOUNTER — Telehealth: Payer: BC Managed Care – PPO | Admitting: Hematology and Oncology

## 2023-05-18 ENCOUNTER — Encounter: Payer: Self-pay | Admitting: Family

## 2023-05-19 ENCOUNTER — Telehealth: Payer: BC Managed Care – PPO | Admitting: Hematology and Oncology

## 2023-05-27 ENCOUNTER — Inpatient Hospital Stay: Payer: BC Managed Care – PPO | Attending: Hematology and Oncology | Admitting: Hematology and Oncology

## 2023-05-27 DIAGNOSIS — D0502 Lobular carcinoma in situ of left breast: Secondary | ICD-10-CM | POA: Diagnosis not present

## 2023-05-27 NOTE — Assessment & Plan Note (Signed)
04/24/2022: Left lumpectomy: Benign intraductal and sclerosing papilloma. Focal LCIS and ALH, margins negative    Current treatment: Risk reduction of tamoxifen started 06/30/2022 (5 mg dose)   Tamoxifen toxicities: Tolerating it extremely well without any problems or concerns at the 5 mg dose.   Breast cancer surveillance:   12/29/2022: Breast MRI: Benign breast density category B Mammogram scheduled for 06/11/2023   Return to clinic in 1 year for follow-up

## 2023-05-27 NOTE — Progress Notes (Signed)
HEMATOLOGY-ONCOLOGY TELEPHONE VISIT PROGRESS NOTE  I connected with our patient on 05/27/23 at 10:15 AM EST by telephone and verified that I am speaking with the correct person using two identifiers.  I discussed the limitations, risks, security and privacy concerns of performing an evaluation and management service by telephone and the availability of in person appointments.  I also discussed with the patient that there may be a patient responsible charge related to this service. The patient expressed understanding and agreed to proceed.   History of Present Illness:   History of Present Illness   The patient, with a history of breast cancer, has been off tamoxifen for approximately four months due to concerns of blood clot formation. She reported feeling 'a little funny' and experiencing leg discomfort, which prompted blood work. The D-dimer test was elevated, suggesting a potential risk for blood clots, although no clots were detected on ultrasound. The patient expressed concern about the preventive benefits of tamoxifen and is open to resuming the medication if the D-dimer levels normalize.  In addition to the tamoxifen issue, the patient is due for a mammogram in February and is agreeable to another MRI in August for surveillance. She is proactive about preventive measures and early detection of any potential lesions.        Oncology History   No history exists.    REVIEW OF SYSTEMS:   Constitutional: Denies fevers, chills or abnormal weight loss All other systems were reviewed with the patient and are negative. Observations/Objective:     Assessment Plan:  Lobular carcinoma in situ (LCIS) of left breast 04/24/2022: Left lumpectomy: Benign intraductal and sclerosing papilloma. Focal LCIS and ALH, margins negative    Current treatment: Risk reduction of tamoxifen started 06/30/2022 (5 mg dose), stopped when her D dimer was elevated (Sept 2024)   Breast cancer surveillance:    12/29/2022: Breast MRI: Benign breast density category B Mammogram scheduled for 06/11/2023   Return to clinic in 1 year for follow-up --------------------------------- Assessment and Plan    Breast Cancer Prevention Tamoxifen discontinued due to concerns of elevated D-dimer and potential risk of blood clots. No actual blood clots detected. Discussed the possibility of false D-dimer readings and the potential to resume Tamoxifen if D-dimer normalizes. -Monitor D-dimer levels and consider resuming Tamoxifen if levels normalize.  Breast Cancer Surveillance Last MRI in August was normal. Mammogram scheduled for February. -Continue with alternating mammogram and MRI every 6 months for surveillance. -Schedule next MRI for August.          I discussed the assessment and treatment plan with the patient. The patient was provided an opportunity to ask questions and all were answered. The patient agreed with the plan and demonstrated an understanding of the instructions. The patient was advised to call back or seek an in-person evaluation if the symptoms worsen or if the condition fails to improve as anticipated.   I provided 20 minutes of non-face-to-face time during this encounter.  This includes time for charting and coordination of care   Tamsen Meek, MD

## 2023-06-11 ENCOUNTER — Encounter: Payer: Self-pay | Admitting: Family

## 2023-06-11 ENCOUNTER — Ambulatory Visit
Admission: RE | Admit: 2023-06-11 | Discharge: 2023-06-11 | Disposition: A | Discharge status: Home / Self Care | Payer: BC Managed Care – PPO | Source: Ambulatory Visit | Attending: Hematology and Oncology | Admitting: Hematology and Oncology

## 2023-06-11 DIAGNOSIS — D0502 Lobular carcinoma in situ of left breast: Secondary | ICD-10-CM

## 2023-06-17 ENCOUNTER — Other Ambulatory Visit: Payer: Self-pay

## 2023-06-17 ENCOUNTER — Emergency Department
Admission: EM | Admit: 2023-06-17 | Discharge: 2023-06-17 | Disposition: A | Discharge status: Home / Self Care | Payer: BC Managed Care – PPO | Attending: Student in an Organized Health Care Education/Training Program | Admitting: Student in an Organized Health Care Education/Training Program

## 2023-06-17 DIAGNOSIS — Z853 Personal history of malignant neoplasm of breast: Secondary | ICD-10-CM | POA: Diagnosis not present

## 2023-06-17 DIAGNOSIS — I10 Essential (primary) hypertension: Secondary | ICD-10-CM | POA: Diagnosis not present

## 2023-06-17 DIAGNOSIS — R509 Fever, unspecified: Secondary | ICD-10-CM | POA: Diagnosis present

## 2023-06-17 DIAGNOSIS — J101 Influenza due to other identified influenza virus with other respiratory manifestations: Secondary | ICD-10-CM | POA: Diagnosis not present

## 2023-06-17 LAB — RESP PANEL BY RT-PCR (RSV, FLU A&B, COVID)  RVPGX2
Influenza A by PCR: POSITIVE — AB
Influenza B by PCR: NEGATIVE
Resp Syncytial Virus by PCR: NEGATIVE
SARS Coronavirus 2 by RT PCR: NEGATIVE

## 2023-06-17 MED ORDER — PROMETHAZINE-DM 6.25-15 MG/5ML PO SYRP: 5.0000 mL | ORAL_SOLUTION | Freq: Four times a day (QID) | ORAL | 0 refills | Status: DC | PRN

## 2023-06-17 MED ORDER — ACETAMINOPHEN 500 MG PO TABS
1000.0000 mg | ORAL_TABLET | Freq: Once | ORAL | Status: AC
  Administered 2023-06-17: 1000 mg via ORAL
  Filled 2023-06-17: qty 2

## 2023-06-17 NOTE — ED Provider Notes (Signed)
[facility] Provider Note    Event Date/Time   First MD Initiated Contact with Patient 06/17/23 0945     (approximate)   History   Fever   HPI  Jennifer Haas is a 48 y.o. female presents to the ED with complaint of 3 days cough, congestion, and bodyaches.  Patient has been exposed to the flu recently.  Patient has a history of hypertension, prediabetes, anemia, PVCs, TIA and breast cancer.     Physical Exam   Triage Vital Signs: ED Triage Vitals  Encounter Vitals Group     BP 06/17/23 0938 (!) 161/91     Systolic BP Percentile --      Diastolic BP Percentile --      Pulse Rate 06/17/23 0938 96     Resp 06/17/23 0938 18     Temp 06/17/23 0938 99.1 F (37.3 C)     Temp src --      SpO2 06/17/23 0938 95 %     Weight 06/17/23 0937 170 lb (77.1 kg)     Height 06/17/23 0937 5\' 5"  (1.651 m)     Head Circumference --      Peak Flow --      Pain Score 06/17/23 0937 8     Pain Loc --      Pain Education --      Exclude from Growth Chart --     Most recent vital signs: Vitals:   06/17/23 0938  BP: (!) 161/91  Pulse: 96  Resp: 18  Temp: 99.1 F (37.3 C)  SpO2: 95%     General: Awake, no distress.  CV:  Good peripheral perfusion.  Heart regular rate and rhythm. Resp:  Normal effort.  Lungs clear bilaterally. Abd:  No distention.  Other:     ED Results / Procedures / Treatments   Labs (all labs ordered are listed, but only abnormal results are displayed) Labs Reviewed  RESP PANEL BY RT-PCR (RSV, FLU A&B, COVID)  [id number] - Abnormal; Notable for the following components:      Result Value   Influenza A by PCR POSITIVE (*)    All other components within normal limits     PROCEDURES:  Critical Care performed:   Procedures   MEDICATIONS ORDERED IN ED: Medications  acetaminophen (TYLENOL) tablet 1,000 mg (1,000 mg Oral Given 06/17/23 1014)     IMPRESSION / MDM / ASSESSMENT AND PLAN / ED COURSE  I reviewed the triage  vital signs and the nursing notes.   Differential diagnosis includes, but is not limited to, COVID, influenza, RSV, viral illness, upper respiratory infection.  48 year old female presents to the ED with complaint of upper respiratory symptoms and was made aware that her respiratory panel was positive for influenza.  We discussed comfort measures at home with increased fluids and Tylenol or ibuprofen.  She is to follow-up with her PCP if any continued problems or urgent care.  A prescription for Promethazine DM was sent to the pharmacy for her to take as needed for cough and congestion.      Patient's presentation is most consistent with acute complicated illness / injury requiring diagnostic workup.  FINAL CLINICAL IMPRESSION(S) / ED DIAGNOSES   Final diagnoses:  Influenza A     Rx / DC Orders   ED Discharge Orders          Ordered    promethazine-dextromethorphan (PROMETHAZINE-DM) 6.25-15 MG/5ML syrup  4 times daily PRN,   Status:  Discontinued        06/17/23 1027    promethazine-dextromethorphan (PROMETHAZINE-DM) 6.25-15 MG/5ML syrup  4 times daily PRN        06/17/23 1028             Note:  This document was prepared using Dragon voice recognition software and may include unintentional dictation errors.   Tommi Rumps, PA-C 06/17/23 1031    Willy Eddy, MD 06/17/23 [phone]

## 2023-06-17 NOTE — Discharge Instructions (Signed)
Follow-up with your primary care provider if any continued problems or urgent care if needed.  Read the information about influenza.  You are considered contagious at this time.  A prescription for cough medication was sent to the pharmacy to take as needed.  Do not combine this with your NyQuil that you have at home.  Tylenol or ibuprofen as needed for fever, body aches or headache.

## 2023-06-17 NOTE — ED Triage Notes (Signed)
Pt comes with flu like symptoms for three days now. Pt states chills, cough congestion. Pt states body aches.

## 2023-09-07 ENCOUNTER — Encounter: Payer: Self-pay | Admitting: Family

## 2023-09-07 NOTE — Telephone Encounter (Signed)
 Can we have her make an appt to evaluate this and provide documentation to support if we are able to sign this paperwork off?

## 2023-09-08 NOTE — Telephone Encounter (Signed)
 Spoke to pt, scheduled ov for 09/14/23

## 2023-09-09 ENCOUNTER — Encounter: Payer: BC Managed Care – PPO | Admitting: Family

## 2023-09-14 ENCOUNTER — Ambulatory Visit: Admitting: Family

## 2023-09-16 ENCOUNTER — Ambulatory Visit: Admitting: Family

## 2023-09-16 ENCOUNTER — Encounter: Payer: Self-pay | Admitting: Family

## 2023-09-16 VITALS — BP 134/84 | HR 73 | Temp 98.4°F | Ht 65.0 in | Wt 179.4 lb

## 2023-09-16 DIAGNOSIS — R600 Localized edema: Secondary | ICD-10-CM | POA: Insufficient documentation

## 2023-09-16 MED ORDER — HYDROCHLOROTHIAZIDE 12.5 MG PO TABS
ORAL_TABLET | ORAL | 1 refills | Status: AC
Start: 2023-09-16 — End: ?

## 2023-09-16 NOTE — Progress Notes (Addendum)
 Established Patient Office Visit  Subjective:   Patient ID: Jennifer Haas, female    DOB: October 29, 1975  Age: 48 y.o. MRN: [id number]  CC:  Chief Complaint  Patient presents with   Medical Management of Chronic Issues    HPI: Jennifer Haas is a 48 y.o. female presenting on 09/16/2023 for Medical Management of Chronic Issues  Has noticed issues with swelling in bil lower ext but states mainly her left lower ext from knee down. The pain is in the calf which she describes as dull and achy, does not radiate, not aggravated by movement, worse when sitting still. No pain with standing for long periods of time. Denies knee pain or posterior knee pain. This does not occur daily, probably about twice a week especially if sitting for longer periods, she does try to get up and walk around her office which does help. She does not wear compression stockings, she was having a lot of salt in her diet but she has cut down on this. Has not taken any over the counter medications to help with this, she does take aspirin daily. She is prescribed lipitor but admits she does not take this at all.   No known history of injury or trauma.  She recently took a trip to [place], an hour and half away, and she states her leg felt so heavy that she has started to stay away from long trips. She states she had scheduled an air bnb in [place] and had been planning to drive there however she states 'I just can not take that trip with the way my leg has been hurting'. She states she has been on road trips in the past, even back in feb, and it wasn't as painful.   She did have an US  venous lower ext with no evidence of DVT. CTA also 9/13 without pulmonary embolus or airspace opacity.      ROS: Negative unless specifically indicated above in HPI.   Relevant past medical history reviewed and updated as indicated.   Allergies and medications reviewed and updated.   Current Outpatient Medications:     aspirin 81 MG chewable tablet, Chew 81 mg by mouth daily., Disp: , Rfl:    atorvastatin  (LIPITOR) 10 MG tablet, Take 1 tablet (10 mg total) by mouth daily., Disp: 90 tablet, Rfl: 3   cyclobenzaprine  (FLEXERIL ) 10 MG tablet, Take 0.5-1 tablets (5-10 mg total) by mouth 3 (three) times daily as needed., Disp: 30 tablet, Rfl: 0   hydrochlorothiazide (HYDRODIURIL) 12.5 MG tablet, Take one po every day prn edema, Disp: 30 tablet, Rfl: 1   lisinopril  (ZESTRIL ) 10 MG tablet, Take 1 tablet (10 mg total) by mouth daily. (Resume as instructed by your outpatient provider): For hypertension, Disp: 90 tablet, Rfl: 3   traZODone  (DESYREL ) 50 MG tablet, TAKE 0.5-1 TABLETS BY MOUTH AT BEDTIME AS NEEDED FOR SLEEP., Disp: 90 tablet, Rfl: 1  Allergies  Allergen Reactions   Bactrim  [Sulfamethoxazole -Trimethoprim ] Itching   Tamoxifen  Other (See Comments)    Tingling lower legs     Objective:   BP 134/84 (BP Location: Left Arm, Patient Position: Sitting, Cuff Size: Normal)   Pulse 73   Temp 98.4 F (36.9 C) (Temporal)   Ht 5\' 5"  (1.651 m)   Wt 179 lb 6.4 oz (81.4 kg)   SpO2 98%   BMI 29.85 kg/m    Physical Exam Vitals reviewed.  Constitutional:      General: She is not in acute distress.  Appearance: Normal appearance. She is normal weight. She is not ill-appearing, toxic-appearing or diaphoretic.  HENT:     Head: Normocephalic.  Cardiovascular:     Rate and Rhythm: Normal rate.  Pulmonary:     Effort: Pulmonary effort is normal.  Musculoskeletal:        General: Normal range of motion.  Neurological:     General: No focal deficit present.     Mental Status: She is alert and oriented to person, place, and time. Mental status is at baseline.  Psychiatric:        Mood and Affect: Mood normal.        Behavior: Behavior normal.        Thought Content: Thought content normal.        Judgment: Judgment normal.    No edema present today. No pain in calf when palpated. Negative homans. No  erythema lower extremities.   Assessment & Plan:  Pedal edema Assessment & Plan: Not present on physical exam today.  No concerning signs however for DVT  Low suspicion for vascular cause pulses present and 2+  Suspect sedentary moments contribute to this.  Advised to elevate legs, watch salt intake, and if sitting for long periods of time to get up and walk about every 1 hour. Compression stockings encouraged. Hydrochlorothiazide 12.5 mg po every day prn edema.  Reviewed CTA and u/s lower ext  Orders: -     hydroCHLOROthiazide; Take one po every day prn edema  Dispense: 30 tablet; Refill: 1     Follow up plan: Return if symptoms worsen or fail to improve.  Felicita Horns, FNP

## 2023-09-16 NOTE — Assessment & Plan Note (Addendum)
 Not present on physical exam today.  No concerning signs however for DVT  Low suspicion for vascular cause pulses present and 2+  Suspect sedentary moments contribute to this.  Advised to elevate legs, watch salt intake, and if sitting for long periods of time to get up and walk about every 1 hour. Compression stockings encouraged. Hydrochlorothiazide 12.5 mg po every day prn edema.  Reviewed CTA and u/s lower ext

## 2023-10-20 ENCOUNTER — Other Ambulatory Visit: Payer: Self-pay | Admitting: Family

## 2023-10-20 DIAGNOSIS — R600 Localized edema: Secondary | ICD-10-CM

## 2023-10-29 ENCOUNTER — Encounter: Payer: Self-pay | Admitting: Family

## 2023-11-10 ENCOUNTER — Ambulatory Visit: Admitting: Family

## 2023-12-03 ENCOUNTER — Other Ambulatory Visit: Payer: Self-pay | Admitting: *Deleted

## 2023-12-03 ENCOUNTER — Encounter: Payer: Self-pay | Admitting: Hematology and Oncology

## 2023-12-03 ENCOUNTER — Encounter: Admitting: Family

## 2023-12-03 MED ORDER — LORAZEPAM 0.5 MG PO TABS: 0.5000 mg | ORAL_TABLET | Freq: Three times a day (TID) | ORAL | 0 refills | Status: DC

## 2023-12-03 NOTE — Progress Notes (Signed)
 Received call from pt with hx of claustrophobia with scans.  Pt requesting prescription for Ativan  be sent in to pharmacy on file. RN reviewed with MD and verbal orders received and placed for pt to be prescribed ativan  0.5 mg p.o tablet x2 with instructions to take one tablet 1 hr prior to scan and may repeat with 2nd tablet 30 minutes prior to scan if needed.  Pt also educated that a driver will be needed that day.  Pt verbalized understanding and prescription sent to pharmacy on file

## 2023-12-08 ENCOUNTER — Telehealth: Payer: Self-pay

## 2023-12-08 NOTE — Telephone Encounter (Signed)
 Copied from CRM [id number]. Topic: Medical Record Request - Records Request >> Dec 08, 2023  3:29 PM [name] wrote: Reason for CRM: Pt is requesting a copy of her immunization record for school. Please contact pt when ready for pick up or load to her My Chart if possible.

## 2023-12-26 ENCOUNTER — Other Ambulatory Visit: Payer: BC Managed Care – PPO

## 2024-01-02 ENCOUNTER — Ambulatory Visit
Admission: RE | Admit: 2024-01-02 | Discharge: 2024-01-02 | Disposition: A | Discharge status: Home / Self Care | Source: Ambulatory Visit | Attending: Hematology and Oncology | Admitting: Hematology and Oncology

## 2024-01-02 DIAGNOSIS — D0502 Lobular carcinoma in situ of left breast: Secondary | ICD-10-CM

## 2024-01-02 MED ORDER — GADOPICLENOL 0.5 MMOL/ML IV SOLN
9.0000 mL | Freq: Once | INTRAVENOUS | Status: AC | PRN
Start: 2024-01-02 — End: 2024-01-02
  Administered 2024-01-02: 9 mL via INTRAVENOUS

## 2024-01-05 ENCOUNTER — Telehealth: Payer: Self-pay | Admitting: Hematology and Oncology

## 2024-01-05 NOTE — Telephone Encounter (Signed)
 I informed the patient that the breast MRI is normal

## 2024-01-08 ENCOUNTER — Emergency Department
Admission: EM | Admit: 2024-01-08 | Discharge: 2024-01-08 | Disposition: A | Discharge status: Home / Self Care | Attending: Emergency Medicine | Admitting: Emergency Medicine

## 2024-01-08 ENCOUNTER — Encounter: Payer: Self-pay | Admitting: *Deleted

## 2024-01-08 ENCOUNTER — Other Ambulatory Visit: Payer: Self-pay

## 2024-01-08 DIAGNOSIS — B349 Viral infection, unspecified: Secondary | ICD-10-CM | POA: Diagnosis not present

## 2024-01-08 DIAGNOSIS — I10 Essential (primary) hypertension: Secondary | ICD-10-CM | POA: Insufficient documentation

## 2024-01-08 DIAGNOSIS — R519 Headache, unspecified: Secondary | ICD-10-CM | POA: Diagnosis present

## 2024-01-08 DIAGNOSIS — M791 Myalgia, unspecified site: Secondary | ICD-10-CM

## 2024-01-08 LAB — URINALYSIS, ROUTINE W REFLEX MICROSCOPIC
Bacteria, UA: NONE SEEN
Bilirubin Urine: NEGATIVE
Glucose, UA: NEGATIVE mg/dL
Ketones, ur: NEGATIVE mg/dL
Leukocytes,Ua: NEGATIVE
Nitrite: NEGATIVE
Protein, ur: 100 mg/dL — AB
Specific Gravity, Urine: 1.018 (ref 1.005–1.030)
pH: 5 (ref 5.0–8.0)

## 2024-01-08 LAB — RESP PANEL BY RT-PCR (RSV, FLU A&B, COVID)  RVPGX2
Influenza A by PCR: NEGATIVE
Influenza B by PCR: NEGATIVE
Resp Syncytial Virus by PCR: NEGATIVE
SARS Coronavirus 2 by RT PCR: NEGATIVE

## 2024-01-08 LAB — POC URINE PREG, ED: Preg Test, Ur: NEGATIVE

## 2024-01-08 MED ORDER — IBUPROFEN 600 MG PO TABS
600.0000 mg | ORAL_TABLET | Freq: Once | ORAL | Status: AC
  Administered 2024-01-08: 600 mg via ORAL
  Filled 2024-01-08: qty 1

## 2024-01-08 MED ORDER — ACETAMINOPHEN 500 MG PO TABS
1000.0000 mg | ORAL_TABLET | Freq: Once | ORAL | Status: AC
  Administered 2024-01-08: 1000 mg via ORAL
  Filled 2024-01-08: qty 2

## 2024-01-08 NOTE — ED Notes (Signed)
 Lab indicates they were unable to locate resp swab that was sent with urine. New specimen sent

## 2024-01-08 NOTE — ED Triage Notes (Signed)
 Pt reports onset of headache and body aches that started last night. She took cold medicine for the same. Says she has been urinating more often.

## 2024-01-08 NOTE — ED Provider Notes (Signed)
 [facility] Provider Note    Event Date/Time   First MD Initiated Contact with Patient 01/08/24 [phone]     (approximate)   History   Generalized Body Aches   HPI  Jennifer Haas is a 48 y.o. female   Past medical history of hypertension, here with muscle aches and headache.  1 day of symptoms.  No respiratory symptoms, congestion, fevers, chills, abdominal discomfort, nausea vomiting or diarrhea, has had urinary frequency but has been drinking a lot of water to try to help with her muscle aches.  There is no dysuria.  No other acute medical complaints.  No known sick contacts no recent travel.   External Medical Documents Reviewed: Previous outpatient notes      Physical Exam   Triage Vital Signs: ED Triage Vitals  Encounter Vitals Group     BP 01/08/24 0301 (!) 145/88     Girls Systolic BP Percentile --      Girls Diastolic BP Percentile --      Boys Systolic BP Percentile --      Boys Diastolic BP Percentile --      Pulse Rate 01/08/24 0301 97     Resp 01/08/24 0301 18     Temp 01/08/24 0301 99.7 F (37.6 C)     Temp Source 01/08/24 0301 Oral     SpO2 01/08/24 0301 96 %     Weight 01/08/24 0301 180 lb (81.6 kg)     Height 01/08/24 0301 5' 5 (1.651 m)     Head Circumference --      Peak Flow --      Pain Score 01/08/24 0303 7     Pain Loc --      Pain Education --      Exclude from Growth Chart --     Most recent vital signs: Vitals:   01/08/24 0301  BP: (!) 145/88  Pulse: 97  Resp: 18  Temp: 99.7 F (37.6 C)  SpO2: 96%    General: Awake, no distress.  CV:  Good peripheral perfusion.  Resp:  Normal effort.  Abd:  No distention.  Other:  Awake alert comfortable appearing with normal vital signs, slightly high blood pressure otherwise normal, no hypoxemia, no respiratory distress.  Clear lungs without focality or wheezing.  Neck supple full range of motion.  Appears euvolemic and nontoxic overall.  Soft benign  abdominal exam.   ED Results / Procedures / Treatments   Labs (all labs ordered are listed, but only abnormal results are displayed) Labs Reviewed  URINALYSIS, ROUTINE W REFLEX MICROSCOPIC - Abnormal; Notable for the following components:      Result Value   Color, Urine YELLOW (*)    APPearance HAZY (*)    Hgb urine dipstick MODERATE (*)    Protein, ur 100 (*)    All other components within normal limits  RESP PANEL BY RT-PCR (RSV, FLU A&B, COVID)  [id number]  POC URINE PREG, ED     I ordered and reviewed the above labs they are notable for urinalysis without bacteria or leukocytes.     PROCEDURES:  Critical Care performed: No  Procedures   MEDICATIONS ORDERED IN ED: Medications  acetaminophen  (TYLENOL ) tablet 1,000 mg (1,000 mg Oral Given 01/08/24 0309)  ibuprofen  (ADVIL ) tablet 600 mg (600 mg Oral Given 01/08/24 0459)    IMPRESSION / MDM / ASSESSMENT AND PLAN / ED COURSE  I reviewed the triage vital signs and the nursing notes.  Patient's presentation is most consistent with acute presentation with potential threat to life or bodily function.  Differential diagnosis includes, but is not limited to, viral illness, respiratory infection, adverse effect of medication, considered but less likely life-threatening bacterial illness, meningitis, sepsis    MDM: Overall well-appearing patient with likely the beginning of a viral illness with myalgias and headache, fatigue.  No localizing symptoms and a very benign exam today, doubt life-threatening bacterial illness at this time.  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  She only takes lisinopril .  Viral swabs negative.  Anticipatory guidance given, she will follow with PMD and return with any new or worsening symptoms.       FINAL CLINICAL IMPRESSION(S) / ED DIAGNOSES   Final diagnoses:   Viral syndrome  Myalgia     Rx / DC Orders   ED Discharge Orders     None        Note:  This document was prepared using Dragon voice recognition software and may include unintentional dictation errors.    Cyrena Mylar, MD 01/08/24 [phone]

## 2024-01-08 NOTE — Discharge Instructions (Signed)
 Take acetaminophen 650 mg and ibuprofen 400 mg every 6 hours for pain.  Take with food.  Thank you for choosing [place] for your health care today!  Please see your primary doctor this week for a follow up appointment.   If you have any new, worsening, or unexpected symptoms call your doctor right away or come back to the emergency department for reevaluation.  It was my pleasure to care for you today.   Daneil Dan Modesto Charon, MD

## 2024-03-09 ENCOUNTER — Other Ambulatory Visit: Payer: Self-pay | Admitting: Family

## 2024-03-09 DIAGNOSIS — I10 Essential (primary) hypertension: Secondary | ICD-10-CM

## 2024-03-11 ENCOUNTER — Other Ambulatory Visit: Payer: Self-pay | Admitting: Family

## 2024-03-11 DIAGNOSIS — G479 Sleep disorder, unspecified: Secondary | ICD-10-CM

## 2024-03-11 NOTE — Telephone Encounter (Signed)
 Copied from CRM [id number]. Topic: Clinical - Medication Refill >> Mar 11, 2024 10:53 AM Viola F wrote: Medication: traZODone  (DESYREL ) 50 MG tablet [[id number]] ibuprofen  (ADVIL ) tablet 600 mg  [[id number]]  Has the patient contacted their pharmacy? Yes (Agent: If no, request that the patient contact the pharmacy for the refill. If patient does not wish to contact the pharmacy document the reason why and proceed with request.) (Agent: If yes, when and what did the pharmacy advise?)  This is the patient's preferred pharmacy:  [phone] [employer] [place] - [address] [place] [address] Phone: [phone] Fax: [phone]  Is this the correct pharmacy for this prescription? Yes If no, delete pharmacy and type the correct one.   Has the prescription been filled recently? Yes  Is the patient out of the medication? Yes  Has the patient been seen for an appointment in the last year OR does the patient have an upcoming appointment? Yes  Can we respond through MyChart? Yes  Agent: Please be advised that Rx refills may take up to 3 business days. We ask that you follow-up with your pharmacy.

## 2024-03-14 MED ORDER — TRAZODONE HCL 50 MG PO TABS: 50.0000 mg | ORAL_TABLET | Freq: Every day | ORAL | 1 refills | Status: DC

## 2024-03-25 ENCOUNTER — Ambulatory Visit (INDEPENDENT_AMBULATORY_CARE_PROVIDER_SITE_OTHER): Admitting: Family

## 2024-03-25 ENCOUNTER — Encounter: Payer: Self-pay | Admitting: Family

## 2024-03-25 ENCOUNTER — Encounter: Admitting: Family

## 2024-03-25 VITALS — BP 122/78 | HR 75 | Temp 98.1°F | Ht 65.0 in | Wt 177.8 lb

## 2024-03-25 DIAGNOSIS — R7303 Prediabetes: Secondary | ICD-10-CM

## 2024-03-25 DIAGNOSIS — M545 Low back pain, unspecified: Secondary | ICD-10-CM

## 2024-03-25 DIAGNOSIS — R400 Somnolence: Secondary | ICD-10-CM

## 2024-03-25 DIAGNOSIS — D509 Iron deficiency anemia, unspecified: Secondary | ICD-10-CM

## 2024-03-25 DIAGNOSIS — Z0000 Encounter for general adult medical examination without abnormal findings: Secondary | ICD-10-CM | POA: Diagnosis not present

## 2024-03-25 DIAGNOSIS — I10 Essential (primary) hypertension: Secondary | ICD-10-CM

## 2024-03-25 DIAGNOSIS — M62838 Other muscle spasm: Secondary | ICD-10-CM

## 2024-03-25 DIAGNOSIS — E7800 Pure hypercholesterolemia, unspecified: Secondary | ICD-10-CM | POA: Diagnosis not present

## 2024-03-25 DIAGNOSIS — G8929 Other chronic pain: Secondary | ICD-10-CM

## 2024-03-25 DIAGNOSIS — E559 Vitamin D deficiency, unspecified: Secondary | ICD-10-CM

## 2024-03-25 DIAGNOSIS — Z8673 Personal history of transient ischemic attack (TIA), and cerebral infarction without residual deficits: Secondary | ICD-10-CM

## 2024-03-25 DIAGNOSIS — G479 Sleep disorder, unspecified: Secondary | ICD-10-CM

## 2024-03-25 LAB — CBC
HCT: 37.6 % (ref 36.0–46.0)
Hemoglobin: 12.3 g/dL (ref 12.0–15.0)
MCHC: 32.7 g/dL (ref 30.0–36.0)
MCV: 84.8 fl (ref 78.0–100.0)
Platelets: 326 K/uL (ref 150.0–400.0)
RBC: 4.43 Mil/uL (ref 3.87–5.11)
RDW: 14.6 % (ref 11.5–15.5)
WBC: 4.3 K/uL (ref 4.0–10.5)

## 2024-03-25 LAB — COMPREHENSIVE METABOLIC PANEL WITH GFR
ALT: 12 U/L (ref 0–35)
AST: 15 U/L (ref 0–37)
Albumin: 4.2 g/dL (ref 3.5–5.2)
Alkaline Phosphatase: 89 U/L (ref 39–117)
BUN: 12 mg/dL (ref 6–23)
CO2: 29 meq/L (ref 19–32)
Calcium: 9.2 mg/dL (ref 8.4–10.5)
Chloride: 103 meq/L (ref 96–112)
Creatinine, Ser: 0.56 mg/dL (ref 0.40–1.20)
GFR: 107.36 mL/min (ref >60.00)
Glucose, Bld: 120 mg/dL — ABNORMAL HIGH (ref 70–99)
Potassium: 4 meq/L (ref 3.5–5.1)
Sodium: 138 meq/L (ref 135–145)
Total Bilirubin: 0.4 mg/dL (ref 0.2–1.2)
Total Protein: 7.4 g/dL (ref 6.0–8.3)

## 2024-03-25 LAB — IBC + FERRITIN
Ferritin: 11.9 ng/mL (ref 10.0–291.0)
Iron: 57 ug/dL (ref 42–145)
Saturation Ratios: 12 % — ABNORMAL LOW (ref 20.0–50.0)
TIBC: 474.6 ug/dL — ABNORMAL HIGH (ref 250.0–450.0)
Transferrin: 339 mg/dL (ref 212.0–360.0)

## 2024-03-25 LAB — LIPID PANEL
Cholesterol: 184 mg/dL (ref 0–200)
HDL: 42.9 mg/dL (ref >39.00)
LDL Cholesterol: 122 mg/dL — ABNORMAL HIGH (ref 0–99)
NonHDL: 141.48
Total CHOL/HDL Ratio: 4
Triglycerides: 99 mg/dL (ref 0.0–149.0)
VLDL: 19.8 mg/dL (ref 0.0–40.0)

## 2024-03-25 LAB — HEMOGLOBIN A1C: Hgb A1c MFr Bld: 5.7 % (ref 4.6–6.5)

## 2024-03-25 MED ORDER — ASPIRIN 81 MG PO TBEC: 81.0000 mg | DELAYED_RELEASE_TABLET | Freq: Every day | ORAL | 3 refills | Status: AC

## 2024-03-25 MED ORDER — IBUPROFEN 600 MG PO TABS: 600.0000 mg | ORAL_TABLET | Freq: Three times a day (TID) | ORAL | 0 refills | Status: AC | PRN

## 2024-03-25 MED ORDER — LISINOPRIL 10 MG PO TABS: 10.0000 mg | ORAL_TABLET | Freq: Every day | ORAL | 3 refills | Status: AC

## 2024-03-25 MED ORDER — CYCLOBENZAPRINE HCL 10 MG PO TABS: 5.0000 mg | ORAL_TABLET | Freq: Three times a day (TID) | ORAL | 0 refills | Status: AC | PRN

## 2024-03-25 MED ORDER — ASPIRIN 81 MG PO TBEC: 81.0000 mg | DELAYED_RELEASE_TABLET | Freq: Every day | ORAL | Status: DC

## 2024-03-25 MED ORDER — TRAZODONE HCL 100 MG PO TABS: 100.0000 mg | ORAL_TABLET | Freq: Every day | ORAL | 1 refills | Status: DC

## 2024-03-25 NOTE — Progress Notes (Signed)
 Subjective:  Patient ID: Jennifer Haas, female    DOB: [birth year]  Age: 48 y.o. MRN: [id number]  Patient Care Team: Corwin Antu, FNP as PCP - General (Family Medicine)   CC:  Chief Complaint  Patient presents with   Annual Exam    HPI Jennifer Haas is a 48 y.o. female who presents today for an annual physical exam. She reports consuming a general diet. The patient does not participate in regular exercise at present. She generally feels well. She reports sleeping poorly. She does have additional problems to discuss today.   Vision:Within last year Dental:Receives regular dental care  Mammogram: 06/11/23 Last pap: gynecology Dr. Mccain 2024, last year was negative  Colonoscopy:02/20/21 every seven years   Pt is without acute concerns.   Discussed the use of AI scribe software for clinical note transcription with the patient, who gave verbal consent to proceed.  History of Present Illness Jennifer Haas is a 48 year old female who presents for a routine follow-up visit.  She experiences intermittent low back pain that sometimes radiates upward. The pain is infrequent and typically managed with ibuprofen , which she finds effective. A past episode a couple of years ago was severe enough to require an emergency room visit, but no significant findings were noted at that time. She uses heat for the pain but does not like it touching her skin.  She is experiencing irregular menstrual cycles and suspects she might be going through menopause, as she missed her period for two months and then it returned. She is not experiencing hot flashes. She is taking 'Black Girl Vitamins' for PCOS, which she started four weeks ago, and has noticed improvements in her energy levels and menstrual regularity.  She has a history of TIA and is not currently taking aspirin  regularly, although she has used it in the past. No palpitations or chest pain. She is not currently taking her  cholesterol medication, as she has heard bad reviews about it, although she acknowledges a history of elevated cholesterol levels.  She reports sleep disturbances, often waking up around 2 or 3 AM. She takes trazodone  at night, but sometimes supplements it with Tylenol  PM to sleep through the night. She experiences morning headaches and occasional snoring, which her husband notices when she is very tired.  She is currently taking lisinopril  for hypertension and reports her blood pressure readings are generally between 120-130/70-80 mmHg. She previously experienced leg swelling but does not currently have this issue. She is not taking hydrochlorothiazide  as she feels lisinopril  is sufficient for her blood pressure control and edema control is no longer needed.    Advanced Directives Patient does not have advanced directives   DEPRESSION SCREENING    01/28/2023    8:46 AM 11/18/2022   11:58 AM  PHQ 2/9 Scores  PHQ - 2 Score 0 0  PHQ- 9 Score 4  3      Data saved with a previous flowsheet row definition     ROS: Negative unless specifically indicated above in HPI.    Current Outpatient Medications:    Biotin 1 MG CAPS, Take by mouth., Disp: , Rfl:    ibuprofen  (ADVIL ) 600 MG tablet, Take 1 tablet (600 mg total) by mouth every 8 (eight) hours as needed., Disp: 30 tablet, Rfl: 0   Misc Natural Products (DIM-PLUS PO), Take by mouth., Disp: , Rfl:    traZODone  (DESYREL ) 100 MG tablet, Take 1 tablet (100 mg total) by mouth at bedtime.,  Disp: 30 tablet, Rfl: 1   vitamin B-12 (CYANOCOBALAMIN) 100 MCG tablet, Take 100 mcg by mouth daily., Disp: , Rfl:    zinc gluconate 50 MG tablet, Take 50 mg by mouth daily., Disp: , Rfl:    aspirin  EC 81 MG tablet, Take 1 tablet (81 mg total) by mouth daily. Swallow whole., Disp: 90 tablet, Rfl: 3   cyclobenzaprine  (FLEXERIL ) 10 MG tablet, Take 0.5-1 tablets (5-10 mg total) by mouth 3 (three) times daily as needed., Disp: 30 tablet, Rfl: 0   lisinopril   (ZESTRIL ) 10 MG tablet, Take 1 tablet (10 mg total) by mouth daily. (Resume as instructed by your outpatient provider): For hypertension, Disp: 90 tablet, Rfl: 3    Objective:    BP 122/78 (BP Location: Left Arm, Patient Position: Sitting, Cuff Size: Normal)   Pulse 75   Temp 98.1 F (36.7 C) (Temporal)   Ht 5' 5 (1.651 m)   Wt 177 lb 12.8 oz (80.6 kg)   SpO2 99%   BMI 29.59 kg/m   BP Readings from Last 3 Encounters:  03/25/24 122/78  01/08/24 (!) 145/88  09/16/23 134/84      Physical Exam Vitals reviewed.  Constitutional:      General: She is not in acute distress.    Appearance: Normal appearance. She is normal weight. She is not ill-appearing.  HENT:     Head: Normocephalic.     Right Ear: Tympanic membrane normal.     Left Ear: Tympanic membrane normal.     Nose: Nose normal.     Mouth/Throat:     Mouth: Mucous membranes are moist.  Eyes:     Extraocular Movements: Extraocular movements intact.     Pupils: Pupils are equal, round, and reactive to light.  Cardiovascular:     Rate and Rhythm: Normal rate and regular rhythm.  Pulmonary:     Effort: Pulmonary effort is normal.     Breath sounds: Normal breath sounds.  Abdominal:     General: Abdomen is flat. Bowel sounds are normal.     Palpations: Abdomen is soft.     Tenderness: There is no guarding or rebound.  Musculoskeletal:        General: Normal range of motion.     Cervical back: Normal range of motion.  Skin:    General: Skin is warm.     Capillary Refill: Capillary refill takes less than 2 seconds.  Neurological:     General: No focal deficit present.     Mental Status: She is alert.  Psychiatric:        Mood and Affect: Mood normal.        Behavior: Behavior normal.        Thought Content: Thought content normal.        Judgment: Judgment normal.       Results LABS LDL: 115 mg/dL (87/7975) Total cholesterol: 171 mg/dL (87/7975)      Assessment & Plan:   Assessment and  Plan Assessment & Plan Adult Wellness Visit Routine wellness visit with no acute issues. Discussed general health maintenance, including screenings and vaccinations. - Ordered labs today - Continue routine screenings and vaccinations as scheduled -Patient Counseling(The following topics were reviewed):  Preventative care handout given to pt  Health maintenance and immunizations reviewed. Please refer to Health maintenance section. Pt advised on safe sex, wearing seatbelts in car, and proper nutrition labwork ordered today for annual Dental health: Discussed importance of regular tooth brushing, flossing, and dental visits.  Low back pain Intermittent low back pain radiating upward, managed with ibuprofen  and heat. No recent severe episodes. Previous ER visit for severe pain without significant findings. No current need for further intervention due to infrequency of episodes. - Prescribed ibuprofen  800 mg every 8 hours as needed - Prescribed cyclobenzaprine  5-10 mg tid as needed for severe episodes  Sleep disorder Difficulty maintaining sleep, waking up around 2-3 AM. Currently taking trazodone  50 mg at bedtime, but requires additional sleep aid. Discussed increasing trazodone  to 100 mg for better sleep maintenance. Consideration of sleep apnea due to morning headaches and snoring. - Increased trazodone  to 100 mg at bedtime as needed - Ordered home sleep study to evaluate for sleep apnea  Essential hypertension with personal history of TIA Blood pressure generally well-controlled with lisinopril . Previous TIA necessitates careful management of blood pressure and cholesterol. Discussed importance of aspirin  therapy due to TIA history. Consideration of statin therapy based on cholesterol levels and ASCVD risk score. - Continue lisinopril  10 mg daily - Restart aspirin  81 mg daily - Monitor blood pressure regularly - Will consider statin therapy however ASCVD risk score 3.8% less than 5% so  no statin at this time. Pending labs  Pure hypercholesterolemia Previous LDL level was 115 mg/dL, above target of less than 70 mg/dL due to TIA and hypertension. Discussed potential benefits of statin therapy, including reduction of cholesterol burden and risk of repeat TIA or stroke. Consideration of calcium  score to assess plaque burden. - Ordered lipid panel to reassess cholesterol levels - Will consider statin therapy based on updated cholesterol levels and ASCVD risk score  Vitamin D  deficiency Currently taking ergocalciferol  1.25 mg weekly. - Continue ergocalciferol  1.25 mg weekly          Follow-up: Return in about 6 months (around 09/22/2024) for f/u blood pressure.   Ginger Patrick, FNP

## 2024-03-28 ENCOUNTER — Ambulatory Visit: Payer: Self-pay | Admitting: Family

## 2024-03-28 DIAGNOSIS — M791 Myalgia, unspecified site: Secondary | ICD-10-CM

## 2024-03-28 DIAGNOSIS — Z789 Other specified health status: Secondary | ICD-10-CM

## 2024-03-28 DIAGNOSIS — E7800 Pure hypercholesterolemia, unspecified: Secondary | ICD-10-CM

## 2024-04-04 MED ORDER — ATORVASTATIN CALCIUM 20 MG PO TABS: 20.0000 mg | ORAL_TABLET | Freq: Every day | ORAL | Status: DC

## 2024-04-05 MED ORDER — ATORVASTATIN CALCIUM 20 MG PO TABS: 20.0000 mg | ORAL_TABLET | Freq: Every day | ORAL | 3 refills | Status: DC

## 2024-04-05 NOTE — Addendum Note (Signed)
 Addended by: CORWIN ANTU on: 04/05/2024 03:18 PM   Modules accepted: Orders

## 2024-04-19 ENCOUNTER — Other Ambulatory Visit: Payer: Self-pay | Admitting: Family

## 2024-04-19 DIAGNOSIS — G479 Sleep disorder, unspecified: Secondary | ICD-10-CM

## 2024-04-20 NOTE — Addendum Note (Signed)
 Addended by: CORWIN ANTU on: 04/20/2024 04:25 PM   Modules accepted: Orders

## 2024-04-22 MED ORDER — PRAVASTATIN SODIUM 10 MG PO TABS: 10.0000 mg | ORAL_TABLET | Freq: Every day | ORAL | 1 refills | Status: DC

## 2024-04-22 NOTE — Addendum Note (Signed)
 Addended by: CORWIN ANTU on: 04/22/2024 10:58 AM   Modules accepted: Orders

## 2024-05-12 DIAGNOSIS — M791 Myalgia, unspecified site: Secondary | ICD-10-CM | POA: Insufficient documentation

## 2024-05-12 DIAGNOSIS — Z789 Other specified health status: Secondary | ICD-10-CM | POA: Insufficient documentation

## 2024-05-12 NOTE — Addendum Note (Signed)
 Addended by: CORWIN ANTU on: 05/12/2024 05:58 PM   Modules accepted: Orders

## 2024-05-30 ENCOUNTER — Ambulatory Visit: Payer: BC Managed Care – PPO | Admitting: Hematology and Oncology

## 2024-06-22 ENCOUNTER — Inpatient Hospital Stay: Admitting: Hematology and Oncology
